# Patient Record
Sex: Female | Born: 1960 | Race: White | Hispanic: No | Smoking: Never smoker
Health system: Southern US, Community
[De-identification: ages and names within clinical notes are randomized; demographics above are authoritative.]

## PROBLEM LIST (undated history)

## (undated) DIAGNOSIS — E559 Vitamin D deficiency, unspecified: Secondary | ICD-10-CM

## (undated) DIAGNOSIS — R0600 Dyspnea, unspecified: Secondary | ICD-10-CM

## (undated) DIAGNOSIS — G4700 Insomnia, unspecified: Secondary | ICD-10-CM

## (undated) DIAGNOSIS — M069 Rheumatoid arthritis, unspecified: Secondary | ICD-10-CM

## (undated) DIAGNOSIS — F32A Depression, unspecified: Secondary | ICD-10-CM

## (undated) DIAGNOSIS — Z780 Asymptomatic menopausal state: Secondary | ICD-10-CM

## (undated) DIAGNOSIS — M5126 Other intervertebral disc displacement, lumbar region: Secondary | ICD-10-CM

## (undated) DIAGNOSIS — M25519 Pain in unspecified shoulder: Secondary | ICD-10-CM

## (undated) DIAGNOSIS — F329 Major depressive disorder, single episode, unspecified: Secondary | ICD-10-CM

## (undated) HISTORY — DX: Insomnia, unspecified: G47.00

## (undated) HISTORY — DX: Vitamin D deficiency, unspecified: E55.9

## (undated) HISTORY — DX: Other intervertebral disc displacement, lumbar region: M51.26

## (undated) HISTORY — DX: Asymptomatic menopausal state: Z78.0

## (undated) HISTORY — DX: Depression, unspecified: F32.A

## (undated) HISTORY — DX: Major depressive disorder, single episode, unspecified: F32.9

## (undated) HISTORY — DX: Rheumatoid arthritis, unspecified: M06.9

## (undated) HISTORY — PX: GANGLION CYST EXCISION: SHX1691

---

## 2004-06-08 ENCOUNTER — Emergency Department: Payer: Self-pay | Admitting: Unknown Physician Specialty

## 2004-06-08 ENCOUNTER — Other Ambulatory Visit: Payer: Self-pay

## 2004-06-11 ENCOUNTER — Ambulatory Visit: Admission: RE | Admit: 2004-06-11 | Discharge: 2004-06-11 | Payer: Self-pay | Admitting: Gynecologic Oncology

## 2004-06-15 ENCOUNTER — Emergency Department: Payer: Self-pay | Admitting: General Practice

## 2005-11-14 ENCOUNTER — Ambulatory Visit: Payer: Self-pay | Admitting: Orthopedic Surgery

## 2006-04-01 ENCOUNTER — Ambulatory Visit: Payer: Self-pay

## 2006-09-22 ENCOUNTER — Ambulatory Visit: Payer: Self-pay

## 2007-03-10 ENCOUNTER — Ambulatory Visit: Payer: Self-pay | Admitting: General Surgery

## 2007-06-28 ENCOUNTER — Ambulatory Visit: Payer: Self-pay

## 2008-07-07 HISTORY — PX: BREAST BIOPSY: SHX20

## 2008-07-18 ENCOUNTER — Ambulatory Visit: Payer: Self-pay

## 2009-07-02 ENCOUNTER — Ambulatory Visit: Payer: Self-pay | Admitting: Rheumatology

## 2009-09-27 ENCOUNTER — Ambulatory Visit: Payer: Self-pay

## 2010-09-19 ENCOUNTER — Ambulatory Visit: Payer: Self-pay

## 2010-12-24 ENCOUNTER — Ambulatory Visit: Payer: Self-pay

## 2010-12-26 ENCOUNTER — Ambulatory Visit: Payer: Self-pay

## 2011-06-18 DIAGNOSIS — M5126 Other intervertebral disc displacement, lumbar region: Secondary | ICD-10-CM | POA: Insufficient documentation

## 2011-07-08 LAB — HM MAMMOGRAPHY: HM MAMMO: NORMAL

## 2012-07-28 ENCOUNTER — Ambulatory Visit: Payer: Self-pay | Admitting: Family Medicine

## 2013-05-30 ENCOUNTER — Ambulatory Visit: Payer: Self-pay | Admitting: Rheumatology

## 2013-12-12 DIAGNOSIS — M5417 Radiculopathy, lumbosacral region: Secondary | ICD-10-CM | POA: Insufficient documentation

## 2014-02-23 DIAGNOSIS — M1380 Other specified arthritis, unspecified site: Secondary | ICD-10-CM | POA: Insufficient documentation

## 2014-02-23 DIAGNOSIS — M1990 Unspecified osteoarthritis, unspecified site: Secondary | ICD-10-CM | POA: Insufficient documentation

## 2014-04-20 LAB — HM PAP SMEAR: HM PAP: NORMAL

## 2014-05-05 LAB — HM COLONOSCOPY: HM COLON: NEGATIVE

## 2014-08-15 ENCOUNTER — Ambulatory Visit: Payer: Self-pay | Admitting: Family Medicine

## 2014-09-21 ENCOUNTER — Encounter
Admit: 2014-09-21 | Disposition: A | Discharge status: Home / Self Care | Payer: Self-pay | Attending: Rheumatology | Admitting: Rheumatology

## 2014-12-18 ENCOUNTER — Encounter: Payer: Self-pay | Admitting: Family Medicine

## 2014-12-18 ENCOUNTER — Ambulatory Visit (INDEPENDENT_AMBULATORY_CARE_PROVIDER_SITE_OTHER): Payer: 59 | Admitting: Family Medicine

## 2014-12-18 ENCOUNTER — Encounter (INDEPENDENT_AMBULATORY_CARE_PROVIDER_SITE_OTHER): Payer: Self-pay

## 2014-12-18 VITALS — BP 134/86 | HR 74 | Temp 98.5°F | Resp 14 | Ht 64.0 in | Wt 122.0 lb

## 2014-12-18 DIAGNOSIS — F321 Major depressive disorder, single episode, moderate: Secondary | ICD-10-CM | POA: Diagnosis not present

## 2014-12-18 DIAGNOSIS — N951 Menopausal and female climacteric states: Secondary | ICD-10-CM | POA: Insufficient documentation

## 2014-12-18 DIAGNOSIS — E559 Vitamin D deficiency, unspecified: Secondary | ICD-10-CM | POA: Insufficient documentation

## 2014-12-18 DIAGNOSIS — Z1239 Encounter for other screening for malignant neoplasm of breast: Secondary | ICD-10-CM

## 2014-12-18 DIAGNOSIS — G4700 Insomnia, unspecified: Secondary | ICD-10-CM | POA: Insufficient documentation

## 2014-12-18 DIAGNOSIS — M1380 Other specified arthritis, unspecified site: Secondary | ICD-10-CM | POA: Diagnosis not present

## 2014-12-18 NOTE — Progress Notes (Signed)
Name: Candice Guzman   MRN: [id number]    DOB: 01-09-1961   Date:12/18/2014       Progress Note  Subjective  Chief Complaint  Chief Complaint  Patient presents with  . Anxiety    worsening due to athritis pain  . Arthritis    worsening-back  . Insomnia    worsening sleep-4hrs  . Depression    never strated brintellix it was to exspensive    HPI  MAJOR DEPRESSION: she has been depressed for years, but getting worse secondary pain and inability to do thinks that she likes. She also has noticed that perimenopausal symptoms with hot flashes makes her not want to be in public, goes straight home from work.  It makes her feel self conscious. She has not been able to tolerate multiple SSRI's and SNRI'S and unable to afford Brintelix.  Refuses evaluation by psychiatrist.   INFLAMMATORY ARTHRITIS: she sees Dr. Gavin Potters, he suggested to add Methotrexate but she is concerned about side effects. She recently had to take a prednisone taper for another flare and now she has right knee effusion, left elbow pain and also right hand is swollen.  She is taking Tramadol but is not controlling symptoms and is affecting her sleep.   INSOMNIA: taking ambien and is able to fall asleep but waking up secondary to pain in the middle of the night, and can't fall back asleep. It is affecting her mood.    Patient Active Problem List   Diagnosis Date Noted  . Insomnia, persistent 12/18/2014  . Depression, major, single episode, moderate 12/18/2014  . Menopausal symptom 12/18/2014  . Vitamin D deficiency 12/18/2014  . Seronegative arthritis 02/23/2014  . Lumbosacral radiculitis 12/12/2013  . Bulge of lumbar disc without myelopathy 06/18/2011    Past Surgical History  Procedure Laterality Date  . Ganglion cyst excision    . Cesarean section      Family History  Problem Relation Age of Onset  . Emphysema Mother   . Cancer Father 60    lung  . Cancer Brother     tongue    History   Social  History  . Marital Status: Divorced    Spouse Name: N/A  . Number of Children: N/A  . Years of Education: N/A   Occupational History  . Not on file.   Social History Main Topics  . Smoking status: Never Smoker   . Smokeless tobacco: Never Used  . Alcohol Use: No  . Drug Use: No  . Sexual Activity: Yes   Other Topics Concern  . Not on file   Social History Narrative     Current outpatient prescriptions:  .  ALPRAZolam (XANAX) 0.5 MG tablet, Take 1 tablet by mouth at bedtime as needed., Disp: , Rfl:  .  cloNIDine (CATAPRES) 0.1 MG tablet, Take 1 tablet by mouth daily., Disp: , Rfl:  .  golimumab (SIMPONI ARIA) 50 MG/4ML SOLN injection, Inject 50 mg into the vein every 30 (thirty) days., Disp: , Rfl:  .  traMADol (ULTRAM) 50 MG tablet, Take 1 tablet by mouth 2 (two) times daily., Disp: , Rfl:  .  zolpidem (AMBIEN CR) 12.5 MG CR tablet, Take 1 tablet by mouth at bedtime., Disp: , Rfl:   Allergies  Allergen Reactions  . Codeine Itching  . Methotrexate Nausea Only     ROS  Constitutional: Negative for fever or weight change.  Respiratory: Negative for cough and shortness of breath.   Cardiovascular: Negative for chest pain  or palpitations.  Gastrointestinal: Negative for abdominal pain, no bowel changes.  Musculoskeletal:joint pains, increase in warmth of left knee, low back pain  Skin: Negative for rash.  Neurological: Negative for dizziness or headache.  No other specific complaints in a complete review of systems (except as listed in HPI above).  Objective  Filed Vitals:   12/18/14 1540  BP: 134/86  Pulse: 74  Temp: 98.5 F (36.9 C)  TempSrc: Oral  Resp: 14  Height: 5\' 4"  (1.626 m)  Weight: 122 lb (55.339 kg)  SpO2: 97%    Body mass index is 20.93 kg/(m^2).  Physical Exam  Constitutional: Patient appears well-developed and well-nourished. No distress.  HENT: Head: Normocephalic and atraumatic. Nose: Nose normal. Mouth/Throat: Oropharynx is clear and  moist. No oropharyngeal exudate.  Eyes: Conjunctivae and EOM are normal. Pupils are equal, round, and reactive to light. No scleral icterus.  Neck: Normal range of motion. Neck supple. No JVD present. No thyromegaly present.  Cardiovascular: Normal rate, regular rhythm and normal heart sounds.  No murmur heard. No BLE edema. Pulmonary/Chest: Effort normal and breath sounds normal. No respiratory distress. Musculoskeletal: effusion of right knee, no redness or increase in warm, pain during palpation of lumbar spine, neg straight leg raise, increase in warmth of left elbow Neurological: he is alert and oriented to person, place, and time. No cranial nerve deficit. Coordination, balance, strength, speech and gait are normal.  Skin: Skin is warm and dry. No rash noted. No erythema.  Psychiatric: Patient has a normal mood and affect. behavior is normal. Judgment and thought content normal.   PHQ2/9: Depression screen PHQ 2/9 12/18/2014  Decreased Interest 1  Down, Depressed, Hopeless 3  PHQ - 2 Score 4  Altered sleeping 3  Tired, decreased energy 3  Change in appetite 2  Feeling bad or failure about yourself  1  Trouble concentrating 1  Moving slowly or fidgety/restless 2  Suicidal thoughts 1  PHQ-9 Score 17  Difficult doing work/chores Extremely dIfficult     Fall Risk: Fall Risk  12/18/2014  Falls in the past year? No     Assessment & Plan  1. Depression, major, single episode, moderate She could not afford Brintelix , she could not tolerate Effexor, Cymbalta, Citalopram, Lexapro, Zoloft. She is very depressed, she feels like her depression is secondary to having pain all the time. She refuses counseling or seeing a psychiatrist  2. Seronegative arthritis She has an effusion on right knee, no redness or increase in warmth, advised to contact Dr. 12/20/2014 to see if she can get a steroid injection.  She is also having some increase in warmth on left elbow. Just finished a steroid  taper that he gave to her. Advised her to re-consider started methotrexate suggested by him on her last visit.   3. Insomnia, persistent Getting worse because of pain. She can fall asleep with Ambien but has been waking up with pain, advised to discuss pain medication change/increase with Dr. Gavin Potters  4. Menopausal symptom Continue Clonidine, still has cycles, no hormonal replacement is not an option, continue clonidine  5. Breast cancer screening  - MM Digital Screening; Future

## 2014-12-18 NOTE — Patient Instructions (Signed)

## 2015-02-12 ENCOUNTER — Other Ambulatory Visit: Payer: Self-pay | Admitting: Family Medicine

## 2015-02-12 NOTE — Telephone Encounter (Signed)
Patient requesting refill. 

## 2015-03-05 ENCOUNTER — Other Ambulatory Visit: Payer: Self-pay | Admitting: Family Medicine

## 2015-03-05 NOTE — Telephone Encounter (Signed)
Patient requesting refill. 

## 2015-03-20 ENCOUNTER — Encounter (INDEPENDENT_AMBULATORY_CARE_PROVIDER_SITE_OTHER): Payer: Self-pay

## 2015-03-20 ENCOUNTER — Encounter: Payer: Self-pay | Admitting: Family Medicine

## 2015-03-20 ENCOUNTER — Ambulatory Visit (INDEPENDENT_AMBULATORY_CARE_PROVIDER_SITE_OTHER): Payer: 59 | Admitting: Family Medicine

## 2015-03-20 VITALS — BP 126/86 | HR 84 | Temp 98.3°F | Resp 16 | Ht 64.0 in | Wt 121.8 lb

## 2015-03-20 DIAGNOSIS — F321 Major depressive disorder, single episode, moderate: Secondary | ICD-10-CM | POA: Diagnosis not present

## 2015-03-20 DIAGNOSIS — L989 Disorder of the skin and subcutaneous tissue, unspecified: Secondary | ICD-10-CM

## 2015-03-20 DIAGNOSIS — G4700 Insomnia, unspecified: Secondary | ICD-10-CM

## 2015-03-20 DIAGNOSIS — M1380 Other specified arthritis, unspecified site: Secondary | ICD-10-CM | POA: Diagnosis not present

## 2015-03-20 DIAGNOSIS — Z23 Encounter for immunization: Secondary | ICD-10-CM | POA: Diagnosis not present

## 2015-03-20 DIAGNOSIS — N951 Menopausal and female climacteric states: Secondary | ICD-10-CM

## 2015-03-20 MED ORDER — VENLAFAXINE HCL ER 37.5 MG PO CP24: 37.5000 mg | ORAL_CAPSULE | Freq: Every day | ORAL | Status: DC

## 2015-03-20 MED ORDER — CLONIDINE HCL 0.1 MG PO TABS: 0.1000 mg | ORAL_TABLET | Freq: Two times a day (BID) | ORAL | Status: DC

## 2015-03-20 MED ORDER — TRAMADOL HCL 50 MG PO TABS: 50.0000 mg | ORAL_TABLET | Freq: Two times a day (BID) | ORAL | Status: DC | PRN

## 2015-03-20 MED ORDER — ZOLPIDEM TARTRATE ER 12.5 MG PO TBCR: 12.5000 mg | EXTENDED_RELEASE_TABLET | Freq: Every day | ORAL | Status: DC

## 2015-03-20 MED ORDER — ALPRAZOLAM 0.5 MG PO TABS: 0.5000 mg | ORAL_TABLET | Freq: Every evening | ORAL | Status: DC | PRN

## 2015-03-20 NOTE — Progress Notes (Signed)
Name: Candice Guzman   MRN: [id number]    DOB: 25-May-1961   Date:03/20/2015       Progress Note  Subjective  Chief Complaint  Chief Complaint  Patient presents with  . Medication Refill    follow-up  . Insomnia    worsening having trouble staying alseep. only sleeping 4-5hrs per night  . Arthritis    HPI  Insomnia: she takes Ambien CR, she falls asleep, but sometimes wakes up between 2-3 and has difficulty falling back asleep, but she takes a half alprazolam it helps her fall back asleep and she wakes up feeling well.   Seronegative Arthritis: she sees Dr. Gavin Potters, she takes immunosuppressant medication, also Tramadol prn . She also has DDD now and is having a flare of her symptoms with increase of pain  Major Depression: she tried Effexor in the past but could not tolerate, but she tried again a few weeks ago and is tolerating it well and feels more calm and able to focus more. Denies crying spells since she re-started medication  Menopause: she is now on Clonidine, Effexor, and still has excessive sweating, she feels like she cannot go anywhere after work because she does not feel clean.   Patient Active Problem List   Diagnosis Date Noted  . Insomnia, persistent 12/18/2014  . Depression, major, single episode, moderate 12/18/2014  . Menopausal symptom 12/18/2014  . Vitamin D deficiency 12/18/2014  . Seronegative arthritis 02/23/2014  . Lumbosacral radiculitis 12/12/2013  . Bulge of lumbar disc without myelopathy 06/18/2011    Past Surgical History  Procedure Laterality Date  . Ganglion cyst excision    . Cesarean section      Family History  Problem Relation Age of Onset  . Emphysema Mother   . Cancer Father 60    lung  . Cancer Brother     tongue    Social History   Social History  . Marital Status: Divorced    Spouse Name: N/A  . Number of Children: N/A  . Years of Education: N/A   Occupational History  . Not on file.   Social History Main  Topics  . Smoking status: Never Smoker   . Smokeless tobacco: Never Used  . Alcohol Use: No  . Drug Use: No  . Sexual Activity: Yes   Other Topics Concern  . Not on file   Social History Narrative     Current outpatient prescriptions:  .  ALPRAZolam (XANAX) 0.5 MG tablet, Take 1 tablet (0.5 mg total) by mouth at bedtime as needed., Disp: 30 tablet, Rfl: 2 .  cloNIDine (CATAPRES) 0.1 MG tablet, Take 1 tablet (0.1 mg total) by mouth 2 (two) times daily., Disp: 180 tablet, Rfl: 1 .  Golimumab (SIMPONI) 50 MG/0.5ML SOAJ, Inject into the skin., Disp: , Rfl:  .  traMADol (ULTRAM) 50 MG tablet, Take 1 tablet (50 mg total) by mouth every 12 (twelve) hours as needed., Disp: 60 tablet, Rfl: 0 .  zolpidem (AMBIEN CR) 12.5 MG CR tablet, Take 1 tablet (12.5 mg total) by mouth at bedtime., Disp: 30 tablet, Rfl: 2 .  venlafaxine XR (EFFEXOR XR) 37.5 MG 24 hr capsule, Take 1 capsule (37.5 mg total) by mouth daily with breakfast., Disp: 30 capsule, Rfl: 0  Allergies  Allergen Reactions  . Codeine Itching  . Methotrexate Nausea Only     ROS  Constitutional: Negative for fever or weight change.  Respiratory: Negative for cough and shortness of breath.   Cardiovascular: Negative for  chest pain or palpitations.  Gastrointestinal: Negative for abdominal pain, no bowel changes.  Musculoskeletal: Negative for gait problem some  joint swelling on hands.  Skin: Negative for rash.  Neurological: Negative for dizziness or headache.  No other specific complaints in a complete review of systems (except as listed in HPI above).  Objective  Filed Vitals:   03/20/15 1516  BP: 126/86  Pulse: 84  Temp: 98.3 F (36.8 C)  TempSrc: Oral  Resp: 16  Height: 5\' 4"  (1.626 m)  Weight: 121 lb 12.8 oz (55.248 kg)  SpO2: 97%    Body mass index is 20.9 kg/(m^2).  Physical Exam  Constitutional: Patient appears well-developed and well-nourished. No distress.  HEENT: head atraumatic, normocephalic, pupils  equal and reactive to light,  neck supple, throat within normal limits Cardiovascular: Normal rate, regular rhythm and normal heart sounds.  No murmur heard. No BLE edema. Pulmonary/Chest: Effort normal and breath sounds normal. No respiratory distress. Abdominal: Soft.  There is no tenderness. Psychiatric: Patient has a normal mood and affect. behavior is normal. Judgment and thought content normal. Muscular Skeletal: some synovitis and deformities on hands Skin: small area that looks like a scab on right forehead, patient states present for the past year   PHQ2/9: Depression screen [facility] 2/9 03/20/2015 03/20/2015 12/18/2014  Decreased Interest - 0 1  Down, Depressed, Hopeless 3 - 3  PHQ - 2 Score 3 0 4  Altered sleeping 3 - 3  Tired, decreased energy 3 - 3  Change in appetite 0 - 2  Feeling bad or failure about yourself  2 - 1  Trouble concentrating 2 - 1  Moving slowly or fidgety/restless 0 - 2  Suicidal thoughts 0 - 1  PHQ-9 Score 13 - 17  Difficult doing work/chores Somewhat difficult - Extremely dIfficult     Fall Risk: Fall Risk  03/20/2015 12/18/2014  Falls in the past year? No No      Functional Status Survey: Is the patient deaf or have difficulty hearing?: No Does the patient have difficulty seeing, even when wearing glasses/contacts?: No Does the patient have difficulty concentrating, remembering, or making decisions?: No Does the patient have difficulty walking or climbing stairs?: No Does the patient have difficulty dressing or bathing?: No Does the patient have difficulty doing errands alone such as visiting a doctor's office or shopping?: No    Assessment & Plan  1. Insomnia, persistent Continue medication  - zolpidem (AMBIEN CR) 12.5 MG CR tablet; Take 1 tablet (12.5 mg total) by mouth at bedtime.  Dispense: 30 tablet; Refill: 2 - ALPRAZolam (XANAX) 0.5 MG tablet; Take 1 tablet (0.5 mg total) by mouth at bedtime as needed.  Dispense: 30 tablet; Refill:  2  2. Needs flu shot  - Flu Vaccine QUAD 36+ mos PF IM (Fluarix & Fluzone Quad PF) - she will get flu shot at work  3. Menopausal symptom Severe, she would like to see dermatologist about it - cloNIDine (CATAPRES) 0.1 MG tablet; Take 1 tablet (0.1 mg total) by mouth 2 (two) times daily.  Dispense: 180 tablet; Refill: 1 - venlafaxine XR (EFFEXOR XR) 37.5 MG 24 hr capsule; Take 1 capsule (37.5 mg total) by mouth daily with breakfast.  Dispense: 30 capsule; Refill: 0  4. Depression, major, single episode, moderate She is doing better on Effexor, she will trying taking 2 pills and if tolerated she will call back for the 75 mg dose - venlafaxine XR (EFFEXOR XR) 37.5 MG 24 hr capsule; Take 1  capsule (37.5 mg total) by mouth daily with breakfast.  Dispense: 30 capsule; Refill: 0  5. Seronegative arthritis  - traMADol (ULTRAM) 50 MG tablet; Take 1 tablet (50 mg total) by mouth every 12 (twelve) hours as needed.  Dispense: 60 tablet; Refill: 0  6. Skin lesion of face  - Ambulatory referral to Dermatology

## 2015-03-27 ENCOUNTER — Other Ambulatory Visit: Payer: Self-pay | Admitting: Family Medicine

## 2015-03-27 NOTE — Telephone Encounter (Signed)
Patient requesting refill. 

## 2015-03-30 ENCOUNTER — Other Ambulatory Visit: Payer: Self-pay

## 2015-03-30 ENCOUNTER — Encounter: Payer: Self-pay | Admitting: Family Medicine

## 2015-03-30 MED ORDER — VENLAFAXINE HCL ER 37.5 MG PO CP24: 75.0000 mg | ORAL_CAPSULE | Freq: Every day | ORAL | Status: DC

## 2015-03-30 NOTE — Telephone Encounter (Signed)
Please send a 30 day supply to [facility].

## 2015-04-19 ENCOUNTER — Other Ambulatory Visit: Payer: Self-pay | Admitting: Family Medicine

## 2015-04-19 MED ORDER — VENLAFAXINE HCL ER 37.5 MG PO CP24: 75.0000 mg | ORAL_CAPSULE | Freq: Every day | ORAL | Status: DC

## 2015-04-19 NOTE — Telephone Encounter (Signed)
Pt is requesting a refill on Effexor 37.5mg  to be sent to [facility] on [address].  Please call patient and document once complete.

## 2015-06-11 ENCOUNTER — Other Ambulatory Visit: Payer: Self-pay | Admitting: Family Medicine

## 2015-06-11 NOTE — Telephone Encounter (Signed)
Patient requesting refill. 

## 2015-06-19 ENCOUNTER — Encounter: Payer: Self-pay | Admitting: Family Medicine

## 2015-06-19 ENCOUNTER — Ambulatory Visit (INDEPENDENT_AMBULATORY_CARE_PROVIDER_SITE_OTHER): Payer: 59 | Admitting: Family Medicine

## 2015-06-19 VITALS — BP 136/84 | HR 84 | Temp 98.0°F | Resp 18 | Ht 64.0 in | Wt 124.9 lb

## 2015-06-19 DIAGNOSIS — M1380 Other specified arthritis, unspecified site: Secondary | ICD-10-CM

## 2015-06-19 DIAGNOSIS — G4700 Insomnia, unspecified: Secondary | ICD-10-CM | POA: Diagnosis not present

## 2015-06-19 DIAGNOSIS — F321 Major depressive disorder, single episode, moderate: Secondary | ICD-10-CM | POA: Diagnosis not present

## 2015-06-19 DIAGNOSIS — N951 Menopausal and female climacteric states: Secondary | ICD-10-CM

## 2015-06-19 MED ORDER — VENLAFAXINE HCL ER 75 MG PO CP24: 75.0000 mg | ORAL_CAPSULE | Freq: Every day | ORAL | Status: DC

## 2015-06-19 MED ORDER — HYDROCODONE-ACETAMINOPHEN 10-325 MG PO TABS: 1.0000 | ORAL_TABLET | Freq: Four times a day (QID) | ORAL | Status: DC | PRN

## 2015-06-19 MED ORDER — ALPRAZOLAM 0.5 MG PO TABS: 0.5000 mg | ORAL_TABLET | Freq: Every evening | ORAL | Status: DC | PRN

## 2015-06-19 MED ORDER — TRAMADOL HCL 50 MG PO TABS: 50.0000 mg | ORAL_TABLET | Freq: Two times a day (BID) | ORAL | Status: DC | PRN

## 2015-06-19 MED ORDER — ZOLPIDEM TARTRATE ER 12.5 MG PO TBCR: 12.5000 mg | EXTENDED_RELEASE_TABLET | Freq: Every day | ORAL | Status: DC

## 2015-06-19 NOTE — Progress Notes (Signed)
Name: Candice Guzman   MRN: [id number]    DOB: [birth year]   Date:06/19/2015       Progress Note  Subjective  Chief Complaint  Chief Complaint  Patient presents with  . Medication Refill    3 month F/U  . Insomnia    Worse due to arthritis pain- total hours of sleep 4-5 hours nightly, but due to pain was unable to sleep any this weekend.  Marland Kitchen Hot Flashes    Medication helping some  . Rheumatoid Arthritis    Goes to Dr. Lavenia Atlas and will see him in January but arthritis has flaired up and wanted to see if you could prescribed something in the mean time.     HPI  Insomnia: she takes Ambien CR, she falls asleep, but sometimes wakes up between 2-3 and has difficulty falling back asleep, but she takes a half alprazolam it helps her fall back asleep and she wakes up feeling well. She is currently having increase in pain from inflammatory arthritis and is affecting her sleep even more, currently sleeping less than 5 hours per night and is feeling very tired today. She missed work yesterday because of pain and inability to sleep   Seronegative Arthritis: she sees Dr. Gavin Potters, she takes immunosuppressant medication, also Tramadol prn . She also has DDD . She is having a flare at this time. Missed work yesterday.  Advised her to call Dr. Gavin Potters and see if he will fill prednisone her follow up with him is in January.   Major Depression: she was given Effexor 37.5 mg in September, she was feeling better.  Denies crying spells since she re-started medication, but recently with the flare of inflammatory arthritis she has noticed anhedonia and fatigue again. She just increased dose yesterday to 75 mg and only side effects was sedation.   Menopause: she is now on Clonidine, Effexor, and still has excessive sweating, but has improved with medication, she feels like she cannot go anywhere after work because she does not feel clean.  Patient Active Problem List   Diagnosis Date Noted  .  Insomnia, persistent 12/18/2014  . Depression, major, single episode, moderate (HCC) 12/18/2014  . Menopausal symptom 12/18/2014  . Vitamin D deficiency 12/18/2014  . Seronegative arthritis 02/23/2014  . Lumbosacral radiculitis 12/12/2013  . Bulge of lumbar disc without myelopathy 06/18/2011    Past Surgical History  Procedure Laterality Date  . Ganglion cyst excision    . Cesarean section      Family History  Problem Relation Age of Onset  . Emphysema Mother   . Cancer Father 60    lung  . Cancer Brother     tongue    Social History   Social History  . Marital Status: Divorced    Spouse Name: N/A  . Number of Children: N/A  . Years of Education: N/A   Occupational History  . Not on file.   Social History Main Topics  . Smoking status: Never Smoker   . Smokeless tobacco: Never Used  . Alcohol Use: No  . Drug Use: No  . Sexual Activity: Yes   Other Topics Concern  . Not on file   Social History Narrative     Current outpatient prescriptions:  .  ALPRAZolam (XANAX) 0.5 MG tablet, Take 1 tablet (0.5 mg total) by mouth at bedtime as needed., Disp: 30 tablet, Rfl: 2 .  cloNIDine (CATAPRES) 0.1 MG tablet, Take 1 tablet (0.1 mg total) by mouth 2 (two) times  daily., Disp: 180 tablet, Rfl: 1 .  Golimumab (SIMPONI) 50 MG/0.5ML SOAJ, Inject into the skin., Disp: , Rfl:  .  traMADol (ULTRAM) 50 MG tablet, Take 1 tablet (50 mg total) by mouth every 12 (twelve) hours as needed., Disp: 60 tablet, Rfl: 0 .  venlafaxine XR (EFFEXOR-XR) 37.5 MG 24 hr capsule, Take 2 capsules (75 mg total) by mouth daily with breakfast. Increase to two pills of 75 mg as tolerated, Disp: 60 capsule, Rfl: 2 .  zolpidem (AMBIEN CR) 12.5 MG CR tablet, TAKE ONE TABLET BY MOUTH NIGHTLY AT BEDTIME, Disp: 30 tablet, Rfl: 0  Allergies  Allergen Reactions  . Codeine Itching  . Methotrexate Nausea Only     ROS  Constitutional: Negative for fever or weight change.  Respiratory: Negative for cough  and shortness of breath.   Cardiovascular: Negative for chest pain or palpitations.  Gastrointestinal: Negative for abdominal pain, no bowel changes.  Musculoskeletal: Negative for gait problem , positive for joint swelling, left knee and both hands.  Skin: Negative for rash.  Neurological: Negative for dizziness or headache.  No other specific complaints in a complete review of systems (except as listed in HPI above). Objective  Filed Vitals:   06/19/15 1539  BP: 136/84  Pulse: 84  Temp: 98 F (36.7 C)  TempSrc: Oral  Resp: 18  Height: 5\' 4"  (1.626 m)  Weight: 124 lb 14.4 oz (56.654 kg)  SpO2: 97%    Body mass index is 21.43 kg/(m^2).  Physical Exam  Constitutional: Patient appears well-developed and well-nourished.  No distress.  HEENT: head atraumatic, normocephalic, pupils equal and reactive to light, neck supple, throat within normal limits Cardiovascular: Normal rate, regular rhythm and normal heart sounds.  No murmur heard. No BLE edema. Pulmonary/Chest: Effort normal and breath sounds normal. No respiratory distress. Abdominal: Soft.  There is no tenderness. Psychiatric: Patient has a normal mood and affect. behavior is normal. Judgment and thought content normal. Muscular Skeletal: some deformity of DIP joints, enlarged. No synovitis today . Mild effusion left knee  PHQ2/9: Depression screen [facility] 2/9 06/19/2015 03/20/2015 03/20/2015 12/18/2014  Decreased Interest 0 - 0 1  Down, Depressed, Hopeless 0 3 - 3  PHQ - 2 Score 0 3 0 4  Altered sleeping - 3 - 3  Tired, decreased energy - 3 - 3  Change in appetite - 0 - 2  Feeling bad or failure about yourself  - 2 - 1  Trouble concentrating - 2 - 1  Moving slowly or fidgety/restless - 0 - 2  Suicidal thoughts - 0 - 1  PHQ-9 Score - 13 - 17  Difficult doing work/chores - Somewhat difficult - Extremely dIfficult    Fall Risk: Fall Risk  06/19/2015 03/20/2015 12/18/2014  Falls in the past year? Yes No No  Number falls in  past yr: 1 - -  Injury with Fall? Yes - -     Functional Status Survey: Is the patient deaf or have difficulty hearing?: No Does the patient have difficulty seeing, even when wearing glasses/contacts?: Yes (reading glasses) Does the patient have difficulty concentrating, remembering, or making decisions?: No Does the patient have difficulty walking or climbing stairs?: No Does the patient have difficulty dressing or bathing?: No Does the patient have difficulty doing errands alone such as visiting a doctor's office or shopping?: No    Assessment & Plan  1. Insomnia, persistent  We will add pain medication since the pain is keeping her awake at night - ALPRAZolam (  XANAX) 0.5 MG tablet; Take 1 tablet (0.5 mg total) by mouth at bedtime as needed.  Dispense: 30 tablet; Refill: 2 - zolpidem (AMBIEN CR) 12.5 MG CR tablet; Take 1 tablet (12.5 mg total) by mouth at bedtime.  Dispense: 30 tablet; Refill: 1  2. Seronegative arthritis  I will add Norco to take prn for severe pain, advised to call Dr. Gavin Potters - HYDROcodone-acetaminophen [facility]) 10-325 MG tablet; Take 1 tablet by mouth every 6 (six) hours as needed.  Dispense: 20 tablet; Refill: 0 - traMADol (ULTRAM) 50 MG tablet; Take 1 tablet (50 mg total) by mouth every 12 (twelve) hours as needed.  Dispense: 60 tablet; Refill: 0  3. Depression, major, single episode, moderate (HCC)  improving - venlafaxine XR (EFFEXOR-XR) 75 MG 24 hr capsule; Take 1 capsule (75 mg total) by mouth daily with breakfast.  Dispense: 30 capsule; Refill: 2  4. Menopausal symptom  Improving, continue medications, increased dose yesterday to 75 mg daily  - venlafaxine XR (EFFEXOR-XR) 75 MG 24 hr capsule; Take 1 capsule (75 mg total) by mouth daily with breakfast.  Dispense: 30 capsule; Refill: 2

## 2015-07-13 ENCOUNTER — Encounter: Payer: Self-pay | Admitting: *Deleted

## 2015-07-13 ENCOUNTER — Emergency Department: Payer: 59

## 2015-07-13 ENCOUNTER — Emergency Department
Admission: EM | Admit: 2015-07-13 | Discharge: 2015-07-13 | Disposition: A | Discharge status: Home / Self Care | Payer: 59 | Attending: Emergency Medicine | Admitting: Emergency Medicine

## 2015-07-13 DIAGNOSIS — Z79899 Other long term (current) drug therapy: Secondary | ICD-10-CM | POA: Diagnosis not present

## 2015-07-13 DIAGNOSIS — S6391XA Sprain of unspecified part of right wrist and hand, initial encounter: Secondary | ICD-10-CM

## 2015-07-13 DIAGNOSIS — S29012A Strain of muscle and tendon of back wall of thorax, initial encounter: Secondary | ICD-10-CM | POA: Diagnosis not present

## 2015-07-13 DIAGNOSIS — Y92410 Unspecified street and highway as the place of occurrence of the external cause: Secondary | ICD-10-CM | POA: Insufficient documentation

## 2015-07-13 DIAGNOSIS — S29019A Strain of muscle and tendon of unspecified wall of thorax, initial encounter: Secondary | ICD-10-CM

## 2015-07-13 DIAGNOSIS — Y998 Other external cause status: Secondary | ICD-10-CM | POA: Insufficient documentation

## 2015-07-13 DIAGNOSIS — S20219A Contusion of unspecified front wall of thorax, initial encounter: Secondary | ICD-10-CM | POA: Diagnosis not present

## 2015-07-13 DIAGNOSIS — Y9389 Activity, other specified: Secondary | ICD-10-CM | POA: Diagnosis not present

## 2015-07-13 DIAGNOSIS — R0602 Shortness of breath: Secondary | ICD-10-CM | POA: Diagnosis not present

## 2015-07-13 DIAGNOSIS — S6991XA Unspecified injury of right wrist, hand and finger(s), initial encounter: Secondary | ICD-10-CM | POA: Diagnosis present

## 2015-07-13 MED ORDER — TIZANIDINE HCL 4 MG PO TABS: 4.0000 mg | ORAL_TABLET | Freq: Three times a day (TID) | ORAL | Status: DC

## 2015-07-13 MED ORDER — MELOXICAM 15 MG PO TABS
15.0000 mg | ORAL_TABLET | Freq: Every day | ORAL | Status: DC
Start: 2015-07-13 — End: 2015-09-17

## 2015-07-13 MED ORDER — DIAZEPAM 2 MG PO TABS
2.0000 mg | ORAL_TABLET | Freq: Once | ORAL | Status: AC
  Administered 2015-07-13: 2 mg via ORAL

## 2015-07-13 MED ORDER — DIAZEPAM 2 MG PO TABS
ORAL_TABLET | ORAL | Status: AC
  Filled 2015-07-13: qty 1

## 2015-07-13 MED ORDER — OXYCODONE-ACETAMINOPHEN 5-325 MG PO TABS: 1.0000 | ORAL_TABLET | Freq: Four times a day (QID) | ORAL | Status: DC | PRN
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
## 2015-07-13 NOTE — ED Notes (Signed)
Reviewed d/c instructions, pain management techniques, prescriptions, and use of ice/elevation with patient. Pt verbalized understanding.
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
## 2015-07-27 ENCOUNTER — Other Ambulatory Visit: Payer: Self-pay | Admitting: Family Medicine

## 2015-07-27 DIAGNOSIS — G4700 Insomnia, unspecified: Secondary | ICD-10-CM

## 2015-07-27 MED ORDER — ZOLPIDEM TARTRATE ER 12.5 MG PO TBCR
12.5000 mg | EXTENDED_RELEASE_TABLET | Freq: Every day | ORAL | Status: DC
Start: 2015-07-27 — End: 2015-09-17

## 2015-07-27 MED ORDER — ZOLPIDEM TARTRATE ER 12.5 MG PO TBCR: 12.5000 mg | EXTENDED_RELEASE_TABLET | Freq: Every day | ORAL | Status: DC

## 2015-07-27 NOTE — Telephone Encounter (Signed)
Pt needs refill on Ambien

## 2015-07-28 ENCOUNTER — Other Ambulatory Visit: Payer: Self-pay | Admitting: Family Medicine

## 2015-09-10 ENCOUNTER — Other Ambulatory Visit: Payer: Self-pay | Admitting: Family Medicine

## 2015-09-17 ENCOUNTER — Encounter: Payer: Self-pay | Admitting: Family Medicine

## 2015-09-17 ENCOUNTER — Ambulatory Visit (INDEPENDENT_AMBULATORY_CARE_PROVIDER_SITE_OTHER): Payer: 59 | Admitting: Family Medicine

## 2015-09-17 VITALS — BP 126/72 | HR 84 | Temp 97.9°F | Resp 18 | Ht 64.0 in | Wt 121.0 lb

## 2015-09-17 DIAGNOSIS — M5126 Other intervertebral disc displacement, lumbar region: Secondary | ICD-10-CM | POA: Diagnosis not present

## 2015-09-17 DIAGNOSIS — F321 Major depressive disorder, single episode, moderate: Secondary | ICD-10-CM

## 2015-09-17 DIAGNOSIS — M51369 Other intervertebral disc degeneration, lumbar region without mention of lumbar back pain or lower extremity pain: Secondary | ICD-10-CM

## 2015-09-17 DIAGNOSIS — G4700 Insomnia, unspecified: Secondary | ICD-10-CM

## 2015-09-17 DIAGNOSIS — M1380 Other specified arthritis, unspecified site: Secondary | ICD-10-CM

## 2015-09-17 DIAGNOSIS — N951 Menopausal and female climacteric states: Secondary | ICD-10-CM

## 2015-09-17 DIAGNOSIS — M5136 Other intervertebral disc degeneration, lumbar region: Secondary | ICD-10-CM

## 2015-09-17 DIAGNOSIS — Z1239 Encounter for other screening for malignant neoplasm of breast: Secondary | ICD-10-CM

## 2015-09-17 MED ORDER — TIZANIDINE HCL 4 MG PO TABS: 4.0000 mg | ORAL_TABLET | Freq: Three times a day (TID) | ORAL | Status: DC

## 2015-09-17 MED ORDER — VENLAFAXINE HCL ER 75 MG PO CP24: 75.0000 mg | ORAL_CAPSULE | Freq: Every day | ORAL | Status: DC

## 2015-09-17 MED ORDER — ZOLPIDEM TARTRATE ER 12.5 MG PO TBCR
12.5000 mg | EXTENDED_RELEASE_TABLET | Freq: Every day | ORAL | Status: DC
Start: 2015-09-17 — End: 2015-12-20

## 2015-09-17 MED ORDER — TRAMADOL HCL 50 MG PO TABS: 50.0000 mg | ORAL_TABLET | Freq: Two times a day (BID) | ORAL | Status: DC | PRN

## 2015-09-17 MED ORDER — OXYCODONE-ACETAMINOPHEN 5-325 MG PO TABS: 1.0000 | ORAL_TABLET | Freq: Four times a day (QID) | ORAL | Status: DC | PRN

## 2015-09-17 MED ORDER — ALPRAZOLAM 0.5 MG PO TABS: 0.5000 mg | ORAL_TABLET | Freq: Every evening | ORAL | Status: DC | PRN
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
## 2015-11-27 ENCOUNTER — Other Ambulatory Visit: Payer: Self-pay | Admitting: Family Medicine

## 2015-11-27 NOTE — Telephone Encounter (Signed)
Patient requesting refill. 

## 2015-12-19 ENCOUNTER — Ambulatory Visit: Payer: 59 | Admitting: Family Medicine

## 2015-12-20 ENCOUNTER — Encounter: Payer: Self-pay | Admitting: Family Medicine

## 2015-12-20 ENCOUNTER — Ambulatory Visit (INDEPENDENT_AMBULATORY_CARE_PROVIDER_SITE_OTHER): Payer: 59 | Admitting: Family Medicine

## 2015-12-20 VITALS — BP 112/62 | HR 81 | Temp 98.1°F | Resp 16 | Ht 64.0 in | Wt 122.4 lb

## 2015-12-20 DIAGNOSIS — L709 Acne, unspecified: Secondary | ICD-10-CM | POA: Diagnosis not present

## 2015-12-20 DIAGNOSIS — M5126 Other intervertebral disc displacement, lumbar region: Secondary | ICD-10-CM | POA: Diagnosis not present

## 2015-12-20 DIAGNOSIS — G4700 Insomnia, unspecified: Secondary | ICD-10-CM

## 2015-12-20 DIAGNOSIS — M1380 Other specified arthritis, unspecified site: Secondary | ICD-10-CM | POA: Diagnosis not present

## 2015-12-20 DIAGNOSIS — N951 Menopausal and female climacteric states: Secondary | ICD-10-CM

## 2015-12-20 DIAGNOSIS — F321 Major depressive disorder, single episode, moderate: Secondary | ICD-10-CM | POA: Diagnosis not present

## 2015-12-20 DIAGNOSIS — R196 Halitosis: Secondary | ICD-10-CM

## 2015-12-20 DIAGNOSIS — M5136 Other intervertebral disc degeneration, lumbar region: Secondary | ICD-10-CM

## 2015-12-20 MED ORDER — TRAMADOL HCL 50 MG PO TABS: 50.0000 mg | ORAL_TABLET | Freq: Two times a day (BID) | ORAL | Status: DC | PRN

## 2015-12-20 MED ORDER — NYSTATIN 100000 UNIT/ML MT SUSP: 5.0000 mL | Freq: Four times a day (QID) | OROMUCOSAL | Status: DC

## 2015-12-20 MED ORDER — VENLAFAXINE HCL ER 150 MG PO CP24: 150.0000 mg | ORAL_CAPSULE | Freq: Every day | ORAL | Status: DC

## 2015-12-20 MED ORDER — TIZANIDINE HCL 4 MG PO TABS: 4.0000 mg | ORAL_TABLET | Freq: Four times a day (QID) | ORAL | Status: DC | PRN

## 2015-12-20 MED ORDER — CLONIDINE HCL 0.1 MG PO TABS: ORAL_TABLET | ORAL | Status: DC

## 2015-12-20 MED ORDER — ALPRAZOLAM 0.5 MG PO TABS: 0.5000 mg | ORAL_TABLET | Freq: Every evening | ORAL | Status: DC | PRN

## 2015-12-20 MED ORDER — ZOLPIDEM TARTRATE ER 12.5 MG PO TBCR: 12.5000 mg | EXTENDED_RELEASE_TABLET | Freq: Every day | ORAL | Status: DC
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
## 2015-12-25 ENCOUNTER — Telehealth: Payer: Self-pay | Admitting: Family Medicine

## 2015-12-25 MED ORDER — FLUCONAZOLE 150 MG PO TABS: 150.0000 mg | ORAL_TABLET | ORAL | Status: DC

## 2015-12-25 NOTE — Telephone Encounter (Signed)
Pt states she received all her meds in the mail yesterday from her mail order pharmacy. Pt states she was supposed to get one for the bacteria in her mouth from yeast and she  did not receive it. Pt is asking for this one to be sent to [facility] in [place]

## 2015-12-25 NOTE — Telephone Encounter (Signed)
done

## 2016-01-09 ENCOUNTER — Ambulatory Visit: Payer: No Typology Code available for payment source

## 2016-01-21 ENCOUNTER — Ambulatory Visit: Payer: No Typology Code available for payment source

## 2016-01-23 ENCOUNTER — Ambulatory Visit
Admission: RE | Admit: 2016-01-23 | Discharge: 2016-01-23 | Disposition: A | Discharge status: Home / Self Care | Payer: 59 | Source: Ambulatory Visit | Attending: Family Medicine | Admitting: Family Medicine

## 2016-01-23 ENCOUNTER — Other Ambulatory Visit: Payer: Self-pay | Admitting: Family Medicine

## 2016-01-23 DIAGNOSIS — Z1239 Encounter for other screening for malignant neoplasm of breast: Secondary | ICD-10-CM

## 2016-01-23 DIAGNOSIS — Z1231 Encounter for screening mammogram for malignant neoplasm of breast: Secondary | ICD-10-CM | POA: Diagnosis not present

## 2016-01-24 ENCOUNTER — Other Ambulatory Visit: Payer: Self-pay | Admitting: Family Medicine

## 2016-01-24 DIAGNOSIS — R928 Other abnormal and inconclusive findings on diagnostic imaging of breast: Secondary | ICD-10-CM

## 2016-02-08 ENCOUNTER — Ambulatory Visit
Admission: RE | Admit: 2016-02-08 | Discharge: 2016-02-08 | Disposition: A | Discharge status: Home / Self Care | Payer: 59 | Source: Ambulatory Visit | Attending: Family Medicine | Admitting: Family Medicine

## 2016-02-08 DIAGNOSIS — R928 Other abnormal and inconclusive findings on diagnostic imaging of breast: Secondary | ICD-10-CM

## 2016-02-11 LAB — LIPID PANEL
Cholesterol: 197 mg/dL (ref 0–200)
HDL: 78 mg/dL — AB (ref 35–70)
LDL Cholesterol: 108 mg/dL
Triglycerides: 57 mg/dL (ref 40–160)

## 2016-02-11 LAB — HEMOGLOBIN A1C: Hemoglobin A1C: 5.1

## 2016-02-29 ENCOUNTER — Other Ambulatory Visit: Payer: Self-pay | Admitting: Family Medicine

## 2016-03-03 ENCOUNTER — Encounter: Payer: Self-pay | Admitting: Family Medicine

## 2016-03-03 ENCOUNTER — Ambulatory Visit (INDEPENDENT_AMBULATORY_CARE_PROVIDER_SITE_OTHER): Payer: 59 | Admitting: Family Medicine

## 2016-03-03 ENCOUNTER — Other Ambulatory Visit: Payer: Self-pay

## 2016-03-03 VITALS — BP 118/78 | HR 94 | Temp 98.2°F | Resp 18 | Ht 62.75 in | Wt 120.4 lb

## 2016-03-03 DIAGNOSIS — N951 Menopausal and female climacteric states: Secondary | ICD-10-CM

## 2016-03-03 DIAGNOSIS — M5136 Other intervertebral disc degeneration, lumbar region: Secondary | ICD-10-CM

## 2016-03-03 DIAGNOSIS — F321 Major depressive disorder, single episode, moderate: Secondary | ICD-10-CM | POA: Diagnosis not present

## 2016-03-03 DIAGNOSIS — M5126 Other intervertebral disc displacement, lumbar region: Secondary | ICD-10-CM

## 2016-03-03 DIAGNOSIS — G4700 Insomnia, unspecified: Secondary | ICD-10-CM

## 2016-03-03 DIAGNOSIS — M51369 Other intervertebral disc degeneration, lumbar region without mention of lumbar back pain or lower extremity pain: Secondary | ICD-10-CM

## 2016-03-03 DIAGNOSIS — M1380 Other specified arthritis, unspecified site: Secondary | ICD-10-CM

## 2016-03-03 MED ORDER — TRAMADOL HCL 50 MG PO TABS: 50.0000 mg | ORAL_TABLET | Freq: Two times a day (BID) | ORAL | 0 refills | Status: DC | PRN

## 2016-03-03 MED ORDER — ALPRAZOLAM 0.5 MG PO TABS: 0.5000 mg | ORAL_TABLET | Freq: Every evening | ORAL | 0 refills | Status: DC | PRN

## 2016-03-03 MED ORDER — DULOXETINE HCL 30 MG PO CPEP: 30.0000 mg | ORAL_CAPSULE | Freq: Every day | ORAL | 0 refills | Status: DC

## 2016-03-03 MED ORDER — ZOLPIDEM TARTRATE ER 6.25 MG PO TBCR: 6.2500 mg | EXTENDED_RELEASE_TABLET | Freq: Every day | ORAL | 0 refills | Status: DC

## 2016-03-03 NOTE — Telephone Encounter (Signed)
Patient requesting refill of Alprazolam, Zolpidem, and Tramadol be sent into [facility].
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
## 2016-03-04 ENCOUNTER — Encounter: Payer: Self-pay | Admitting: Family Medicine

## 2016-03-25 ENCOUNTER — Other Ambulatory Visit: Payer: Self-pay

## 2016-03-25 DIAGNOSIS — M5126 Other intervertebral disc displacement, lumbar region: Secondary | ICD-10-CM

## 2016-03-25 DIAGNOSIS — M5136 Other intervertebral disc degeneration, lumbar region: Secondary | ICD-10-CM

## 2016-03-25 MED ORDER — TIZANIDINE HCL 4 MG PO TABS: 4.0000 mg | ORAL_TABLET | Freq: Four times a day (QID) | ORAL | 1 refills | Status: DC | PRN

## 2016-06-04 ENCOUNTER — Ambulatory Visit (INDEPENDENT_AMBULATORY_CARE_PROVIDER_SITE_OTHER): Payer: 59 | Admitting: Family Medicine

## 2016-06-04 ENCOUNTER — Encounter: Payer: Self-pay | Admitting: Family Medicine

## 2016-06-04 VITALS — BP 120/78 | HR 104 | Temp 99.0°F | Resp 16 | Ht 63.0 in | Wt 126.1 lb

## 2016-06-04 DIAGNOSIS — N951 Menopausal and female climacteric states: Secondary | ICD-10-CM | POA: Diagnosis not present

## 2016-06-04 DIAGNOSIS — G4700 Insomnia, unspecified: Secondary | ICD-10-CM

## 2016-06-04 DIAGNOSIS — G8929 Other chronic pain: Secondary | ICD-10-CM | POA: Diagnosis not present

## 2016-06-04 DIAGNOSIS — F321 Major depressive disorder, single episode, moderate: Secondary | ICD-10-CM | POA: Diagnosis not present

## 2016-06-04 DIAGNOSIS — M545 Low back pain, unspecified: Secondary | ICD-10-CM

## 2016-06-04 DIAGNOSIS — M1380 Other specified arthritis, unspecified site: Secondary | ICD-10-CM | POA: Diagnosis not present

## 2016-06-04 MED ORDER — DULOXETINE HCL 60 MG PO CPEP: 60.0000 mg | ORAL_CAPSULE | Freq: Every day | ORAL | 1 refills | Status: DC

## 2016-06-04 MED ORDER — ZOLPIDEM TARTRATE ER 12.5 MG PO TBCR: 12.5000 mg | EXTENDED_RELEASE_TABLET | Freq: Every day | ORAL | 0 refills | Status: DC

## 2016-06-04 MED ORDER — CLONIDINE HCL 0.1 MG PO TABS: ORAL_TABLET | ORAL | 1 refills | Status: DC
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
## 2016-06-06 HISTORY — PX: METACARPOPHALANGEAL JOINT ARTHROPLASTY: SUR72

## 2016-06-20 ENCOUNTER — Ambulatory Visit: Payer: 59 | Admitting: Family Medicine

## 2016-07-25 ENCOUNTER — Other Ambulatory Visit: Payer: Self-pay

## 2016-07-25 DIAGNOSIS — G4700 Insomnia, unspecified: Secondary | ICD-10-CM

## 2016-07-25 MED ORDER — ZOLPIDEM TARTRATE ER 12.5 MG PO TBCR: 12.5000 mg | EXTENDED_RELEASE_TABLET | Freq: Every day | ORAL | 0 refills | Status: DC

## 2016-07-25 NOTE — Telephone Encounter (Signed)
Patient requesting refill of Ambien to [facility]. 

## 2016-08-07 ENCOUNTER — Other Ambulatory Visit: Payer: Self-pay | Admitting: Family Medicine

## 2016-08-07 DIAGNOSIS — G8929 Other chronic pain: Secondary | ICD-10-CM

## 2016-08-07 DIAGNOSIS — N951 Menopausal and female climacteric states: Secondary | ICD-10-CM

## 2016-08-07 DIAGNOSIS — M545 Low back pain, unspecified: Secondary | ICD-10-CM

## 2016-08-07 DIAGNOSIS — F321 Major depressive disorder, single episode, moderate: Secondary | ICD-10-CM

## 2016-09-03 ENCOUNTER — Other Ambulatory Visit: Payer: Self-pay

## 2016-09-03 ENCOUNTER — Encounter: Payer: Self-pay | Admitting: Family Medicine

## 2016-09-03 ENCOUNTER — Ambulatory Visit (INDEPENDENT_AMBULATORY_CARE_PROVIDER_SITE_OTHER): Payer: 59 | Admitting: Family Medicine

## 2016-09-03 VITALS — BP 130/86 | HR 78 | Temp 98.0°F | Resp 16 | Ht 63.0 in | Wt 128.4 lb

## 2016-09-03 DIAGNOSIS — M1380 Other specified arthritis, unspecified site: Secondary | ICD-10-CM | POA: Diagnosis not present

## 2016-09-03 DIAGNOSIS — G8929 Other chronic pain: Secondary | ICD-10-CM

## 2016-09-03 DIAGNOSIS — M5126 Other intervertebral disc displacement, lumbar region: Secondary | ICD-10-CM | POA: Diagnosis not present

## 2016-09-03 DIAGNOSIS — R439 Unspecified disturbances of smell and taste: Secondary | ICD-10-CM

## 2016-09-03 DIAGNOSIS — M5136 Other intervertebral disc degeneration, lumbar region: Secondary | ICD-10-CM

## 2016-09-03 DIAGNOSIS — Z9889 Other specified postprocedural states: Secondary | ICD-10-CM

## 2016-09-03 DIAGNOSIS — N951 Menopausal and female climacteric states: Secondary | ICD-10-CM

## 2016-09-03 DIAGNOSIS — M545 Low back pain, unspecified: Secondary | ICD-10-CM

## 2016-09-03 DIAGNOSIS — G4700 Insomnia, unspecified: Secondary | ICD-10-CM | POA: Diagnosis not present

## 2016-09-03 DIAGNOSIS — F321 Major depressive disorder, single episode, moderate: Secondary | ICD-10-CM

## 2016-09-03 MED ORDER — TRAMADOL HCL 50 MG PO TABS: 50.0000 mg | ORAL_TABLET | Freq: Two times a day (BID) | ORAL | 0 refills | Status: DC | PRN

## 2016-09-03 MED ORDER — SUVOREXANT 15 MG PO TABS: 1.0000 | ORAL_TABLET | Freq: Every evening | ORAL | 0 refills | Status: DC | PRN

## 2016-09-03 MED ORDER — TIZANIDINE HCL 4 MG PO TABS: 4.0000 mg | ORAL_TABLET | Freq: Four times a day (QID) | ORAL | 1 refills | Status: DC | PRN

## 2016-09-03 MED ORDER — DULOXETINE HCL 60 MG PO CPEP: 120.0000 mg | ORAL_CAPSULE | Freq: Every day | ORAL | 1 refills | Status: DC

## 2016-09-03 MED ORDER — SUVOREXANT 10 MG PO TABS
1.0000 | ORAL_TABLET | Freq: Every evening | ORAL | 0 refills | Status: DC | PRN
Start: 2016-09-03 — End: 2016-11-18

## 2016-09-03 MED ORDER — CLONIDINE HCL 0.1 MG PO TABS: ORAL_TABLET | ORAL | 1 refills | Status: DC

## 2016-09-03 MED ORDER — SUVOREXANT 20 MG PO TABS: 1.0000 | ORAL_TABLET | Freq: Every evening | ORAL | 0 refills | Status: DC | PRN
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
## 2016-09-03 NOTE — Telephone Encounter (Signed)
Patient requesting refill of Tramadol to [facility].  

## 2016-09-08 ENCOUNTER — Telehealth: Payer: Self-pay

## 2016-09-08 NOTE — Telephone Encounter (Signed)
I got a fax from [facility] stating that this patient needs to contact their office prior to an appt being made. I called and left a message stating that in order to complete the referral she must give their office a call and their number was given [phone]).

## 2016-09-09 NOTE — Telephone Encounter (Signed)
I thought it was sent electronic the day of her visit. Please verify

## 2016-11-18 ENCOUNTER — Ambulatory Visit (INDEPENDENT_AMBULATORY_CARE_PROVIDER_SITE_OTHER): Payer: 59 | Admitting: Family Medicine

## 2016-11-18 ENCOUNTER — Encounter: Payer: Self-pay | Admitting: Family Medicine

## 2016-11-18 VITALS — BP 110/72 | HR 80 | Temp 98.2°F | Resp 14 | Wt 129.1 lb

## 2016-11-18 DIAGNOSIS — Z9889 Other specified postprocedural states: Secondary | ICD-10-CM | POA: Diagnosis not present

## 2016-11-18 DIAGNOSIS — M545 Low back pain, unspecified: Secondary | ICD-10-CM

## 2016-11-18 DIAGNOSIS — N951 Menopausal and female climacteric states: Secondary | ICD-10-CM | POA: Diagnosis not present

## 2016-11-18 DIAGNOSIS — M1380 Other specified arthritis, unspecified site: Secondary | ICD-10-CM | POA: Diagnosis not present

## 2016-11-18 DIAGNOSIS — G4700 Insomnia, unspecified: Secondary | ICD-10-CM

## 2016-11-18 DIAGNOSIS — F321 Major depressive disorder, single episode, moderate: Secondary | ICD-10-CM | POA: Diagnosis not present

## 2016-11-18 DIAGNOSIS — G8929 Other chronic pain: Secondary | ICD-10-CM

## 2016-11-18 DIAGNOSIS — R439 Unspecified disturbances of smell and taste: Secondary | ICD-10-CM

## 2016-11-18 MED ORDER — ZOLPIDEM TARTRATE ER 12.5 MG PO TBCR: 12.5000 mg | EXTENDED_RELEASE_TABLET | Freq: Every day | ORAL | 0 refills | Status: DC

## 2016-11-18 MED ORDER — QUETIAPINE FUMARATE 25 MG PO TABS
25.0000 mg | ORAL_TABLET | Freq: Every day | ORAL | 0 refills | Status: DC
Start: 2016-11-18 — End: 2017-02-18
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
## 2017-01-20 ENCOUNTER — Other Ambulatory Visit: Payer: Self-pay | Admitting: Family Medicine

## 2017-01-20 DIAGNOSIS — G4700 Insomnia, unspecified: Secondary | ICD-10-CM

## 2017-01-20 NOTE — Telephone Encounter (Signed)
Patient requesting refill of Alprazolam to [facility].  

## 2017-02-18 ENCOUNTER — Ambulatory Visit (INDEPENDENT_AMBULATORY_CARE_PROVIDER_SITE_OTHER): Payer: 59 | Admitting: Family Medicine

## 2017-02-18 ENCOUNTER — Encounter: Payer: Self-pay | Admitting: Family Medicine

## 2017-02-18 VITALS — BP 120/80 | HR 90 | Temp 98.5°F | Resp 16 | Ht 63.0 in | Wt 130.6 lb

## 2017-02-18 DIAGNOSIS — R439 Unspecified disturbances of smell and taste: Secondary | ICD-10-CM | POA: Diagnosis not present

## 2017-02-18 DIAGNOSIS — Z9181 History of falling: Secondary | ICD-10-CM

## 2017-02-18 DIAGNOSIS — F321 Major depressive disorder, single episode, moderate: Secondary | ICD-10-CM | POA: Diagnosis not present

## 2017-02-18 DIAGNOSIS — G8929 Other chronic pain: Secondary | ICD-10-CM

## 2017-02-18 DIAGNOSIS — G4700 Insomnia, unspecified: Secondary | ICD-10-CM

## 2017-02-18 DIAGNOSIS — M545 Low back pain: Secondary | ICD-10-CM

## 2017-02-18 DIAGNOSIS — N951 Menopausal and female climacteric states: Secondary | ICD-10-CM | POA: Diagnosis not present

## 2017-02-18 DIAGNOSIS — M5126 Other intervertebral disc displacement, lumbar region: Secondary | ICD-10-CM

## 2017-02-18 DIAGNOSIS — M1380 Other specified arthritis, unspecified site: Secondary | ICD-10-CM

## 2017-02-18 DIAGNOSIS — M5136 Other intervertebral disc degeneration, lumbar region: Secondary | ICD-10-CM

## 2017-02-18 MED ORDER — ZOLPIDEM TARTRATE ER 12.5 MG PO TBCR: 12.5000 mg | EXTENDED_RELEASE_TABLET | Freq: Every day | ORAL | 0 refills | Status: DC

## 2017-02-18 MED ORDER — CLONIDINE HCL 0.1 MG PO TABS: ORAL_TABLET | ORAL | 1 refills | Status: DC

## 2017-02-18 MED ORDER — TRAMADOL HCL 50 MG PO TABS: 50.0000 mg | ORAL_TABLET | Freq: Two times a day (BID) | ORAL | 0 refills | Status: DC | PRN

## 2017-02-18 MED ORDER — DULOXETINE HCL 60 MG PO CPEP: 120.0000 mg | ORAL_CAPSULE | Freq: Every day | ORAL | 1 refills | Status: DC
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
## 2017-03-31 ENCOUNTER — Other Ambulatory Visit: Payer: Self-pay | Admitting: Family Medicine

## 2017-03-31 DIAGNOSIS — N951 Menopausal and female climacteric states: Secondary | ICD-10-CM

## 2017-04-01 NOTE — Telephone Encounter (Signed)
Patient requesting refill of Clonidine to [facility].

## 2017-05-20 ENCOUNTER — Encounter: Payer: Self-pay | Admitting: Family Medicine

## 2017-05-20 ENCOUNTER — Ambulatory Visit: Payer: 59 | Admitting: Family Medicine

## 2017-05-20 VITALS — BP 108/62 | HR 74 | Temp 98.2°F | Resp 16 | Ht 63.0 in | Wt 129.9 lb

## 2017-05-20 DIAGNOSIS — M1380 Other specified arthritis, unspecified site: Secondary | ICD-10-CM

## 2017-05-20 DIAGNOSIS — R439 Unspecified disturbances of smell and taste: Secondary | ICD-10-CM

## 2017-05-20 DIAGNOSIS — F419 Anxiety disorder, unspecified: Secondary | ICD-10-CM | POA: Diagnosis not present

## 2017-05-20 DIAGNOSIS — N930 Postcoital and contact bleeding: Secondary | ICD-10-CM | POA: Diagnosis not present

## 2017-05-20 DIAGNOSIS — F321 Major depressive disorder, single episode, moderate: Secondary | ICD-10-CM | POA: Diagnosis not present

## 2017-05-20 DIAGNOSIS — M545 Low back pain: Secondary | ICD-10-CM

## 2017-05-20 DIAGNOSIS — Z23 Encounter for immunization: Secondary | ICD-10-CM | POA: Diagnosis not present

## 2017-05-20 DIAGNOSIS — Z9889 Other specified postprocedural states: Secondary | ICD-10-CM | POA: Diagnosis not present

## 2017-05-20 DIAGNOSIS — G8929 Other chronic pain: Secondary | ICD-10-CM | POA: Diagnosis not present

## 2017-05-20 DIAGNOSIS — G4700 Insomnia, unspecified: Secondary | ICD-10-CM

## 2017-05-20 DIAGNOSIS — N951 Menopausal and female climacteric states: Secondary | ICD-10-CM | POA: Diagnosis not present

## 2017-05-20 DIAGNOSIS — M2550 Pain in unspecified joint: Secondary | ICD-10-CM | POA: Diagnosis not present

## 2017-05-20 MED ORDER — ALPRAZOLAM 0.5 MG PO TABS: 0.5000 mg | ORAL_TABLET | Freq: Every evening | ORAL | 0 refills | Status: DC | PRN

## 2017-05-20 MED ORDER — ZOLPIDEM TARTRATE ER 12.5 MG PO TBCR: 12.5000 mg | EXTENDED_RELEASE_TABLET | Freq: Every day | ORAL | 0 refills | Status: DC
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
## 2017-05-20 NOTE — Addendum Note (Signed)
Addended by: Alba Cory F on: 05/20/2017 04:17 PM   Modules accepted: Orders

## 2017-05-26 ENCOUNTER — Encounter: Payer: Self-pay | Admitting: Obstetrics and Gynecology

## 2017-06-01 ENCOUNTER — Ambulatory Visit (INDEPENDENT_AMBULATORY_CARE_PROVIDER_SITE_OTHER): Payer: 59 | Admitting: Obstetrics and Gynecology

## 2017-06-01 ENCOUNTER — Encounter: Payer: Self-pay | Admitting: Obstetrics and Gynecology

## 2017-06-01 VITALS — BP 120/80 | HR 111 | Ht 64.0 in | Wt 133.0 lb

## 2017-06-01 DIAGNOSIS — N898 Other specified noninflammatory disorders of vagina: Secondary | ICD-10-CM

## 2017-06-01 DIAGNOSIS — Z124 Encounter for screening for malignant neoplasm of cervix: Secondary | ICD-10-CM

## 2017-06-01 DIAGNOSIS — N950 Postmenopausal bleeding: Secondary | ICD-10-CM

## 2017-06-01 DIAGNOSIS — Z1151 Encounter for screening for human papillomavirus (HPV): Secondary | ICD-10-CM

## 2017-06-01 DIAGNOSIS — N951 Menopausal and female climacteric states: Secondary | ICD-10-CM

## 2017-06-01 LAB — POCT WET PREP WITH KOH
CLUE CELLS WET PREP PER HPF POC: NEGATIVE
KOH Prep POC: NEGATIVE
TRICHOMONAS UA: NEGATIVE
YEAST WET PREP PER HPF POC: NEGATIVE

## 2017-06-01 NOTE — Progress Notes (Signed)
Chief Complaint  Patient presents with  . Gynecologic Exam    pt states she needs a pap and reports menopausal symptoms    HPI:      [name] is a 56 y.o. G1P1001 who LMP was Patient's last menstrual period was 12/18/2014., presents today for NP eval of postcoital bleeding, referred by PCP Carlynn Purl, Danna Hefty, MD).  Pt is not frequently sex active and was sex active a couple months back. Pt had severe vaginal discomfort and then red vaginal bleeding with small clots for several wks after. She had to change pads Q2-3 hrs. Her LMP was over a year ago. She has vasomotor sx and takes cymbalta and clonidine. She has been sex active again recently and didn't have any bleeding. She used a lubricant the 2nd time with decreased vag dyspareunia. No other pelvic pain/bleeding issues otherwise.   She notes vaginal dryness, even with walking. She uses body wash vaginally, no dryer sheets. No vag d/c, odor, irritation. She has tried replens as a moisturizer.   She is past due for a pap smear. Hx of abn paps in past that were fine with repeat/ without treatment per pt report.    Past Medical History:  Diagnosis Date  . Depression   . Insomnia   . Lumbar herniated disc   . Menopause   . Rheumatoid arthritis (HCC)   . Vitamin D deficiency     Past Surgical History:  Procedure Laterality Date  . BREAST BIOPSY Left 2010   neg  . CESAREAN SECTION    . GANGLION CYST EXCISION    . METACARPOPHALANGEAL JOINT ARTHROPLASTY Right 06/06/2016   Dr. Dayna Barker     Family History  Problem Relation Age of Onset  . Emphysema Mother   . Cancer Father 60       lung  . Cancer Brother        tongue    Social History   Socioeconomic History  . Marital status: Divorced    Spouse name: Not on file  . Number of children: Not on file  . Years of education: Not on file  . Highest education level: Not on file  Social Needs  . Financial resource strain: Not on file  . Food insecurity - worry: Not  on file  . Food insecurity - inability: Not on file  . Transportation needs - medical: Not on file  . Transportation needs - non-medical: Not on file  Occupational History  . Not on file  Tobacco Use  . Smoking status: Never Smoker  . Smokeless tobacco: Never Used  Substance and Sexual Activity  . Alcohol use: No    Alcohol/week: 0.0 oz  . Drug use: No  . Sexual activity: Yes  Other Topics Concern  . Not on file  Social History Narrative  . Not on file     Current Outpatient Medications:  .  ALPRAZolam (XANAX) 0.5 MG tablet, Take 1 tablet (0.5 mg total) at bedtime as needed by mouth., Disp: 30 tablet, Rfl: 0 .  cloNIDine (CATAPRES) 0.1 MG tablet, TAKE 1 TABLET BY MOUTH TWO  TIMES DAILY, Disp: 180 tablet, Rfl: 1 .  DULoxetine (CYMBALTA) 60 MG capsule, Take 2 capsules (120 mg total) by mouth daily., Disp: 180 capsule, Rfl: 1 .  HUMIRA PEN 40 MG/0.8ML PNKT, INJECT Subcutaneously EVERY other WEEK, Disp: , Rfl: 3 .  meloxicam (MOBIC) 15 MG tablet, Take by mouth., Disp: , Rfl:  .  methotrexate 50 MG/2ML injection, Inject into  the skin., Disp: , Rfl:  .  tiZANidine (ZANAFLEX) 4 MG tablet, Take 1 tablet (4 mg total) by mouth every 6 (six) hours as needed for muscle spasms., Disp: 120 tablet, Rfl: 1 .  traMADol (ULTRAM) 50 MG tablet, Take 1 tablet (50 mg total) by mouth every 12 (twelve) hours as needed., Disp: 180 tablet, Rfl: 0 .  zolpidem (AMBIEN CR) 12.5 MG CR tablet, Take 1 tablet (12.5 mg total) at bedtime by mouth., Disp: 90 tablet, Rfl: 0   ROS:  Review of Systems  Constitutional: Negative for fever.  Gastrointestinal: Negative for blood in stool, constipation, diarrhea, nausea and vomiting.  Genitourinary: Positive for dyspareunia, vaginal bleeding and vaginal pain. Negative for dysuria, flank pain, frequency, hematuria, urgency and vaginal discharge.  Musculoskeletal: Negative for back pain.  Skin: Negative for rash.     OBJECTIVE:   Vitals:  BP 120/80   Pulse (!) 111    Ht 5\' 4"  (1.626 m)   Wt 133 lb (60.3 kg)   LMP 12/18/2014   BMI 22.83 kg/m   Physical Exam  Constitutional: She is oriented to person, place, and time and well-developed, well-nourished, and in no distress. Vital signs are normal.  Genitourinary: Uterus normal, cervix normal, right adnexa normal, left adnexa normal and vulva normal. Uterus is not enlarged. Cervix exhibits no motion tenderness and no tenderness. Right adnexum displays no mass and no tenderness. Left adnexum displays no mass and no tenderness. Vulva exhibits no erythema, no exudate, no lesion, no rash and no tenderness. Vagina exhibits no lesion. Thick  odorless  white and vaginal discharge found.  Genitourinary Comments: MILD VAG ATROPHY; TENDER ON SPECULUM EXAM  Neurological: She is oriented to person, place, and time.  Vitals reviewed.   Results: Results for orders placed or performed in visit on 06/01/17 (from the past 24 hour(s))  POCT Wet Prep with KOH     Status: Normal   Collection Time: 06/01/17  4:07 PM  Result Value Ref Range   Trichomonas, UA Negative    Clue Cells Wet Prep HPF POC NEG    Epithelial Wet Prep HPF POC  Few, Moderate, Many, Too numerous to count   Yeast Wet Prep HPF POC NEG    Bacteria Wet Prep HPF POC  Few   RBC Wet Prep HPF POC     WBC Wet Prep HPF POC     KOH Prep POC Negative Negative     Assessment/Plan: PMB (postmenopausal bleeding) - Sx one time only. Neg exam today. Check pap smear, GYN u/s. Will f/u with results. Question uterine vs vaginal etiology. If neg, discussed vag ERT. - Plan: 06/03/17 PELVIS TRANSVANGINAL NON-OB (TV ONLY)  Vaginal dryness, menopausal - Dove sensitive skin soap/coconut or olive oil as moisturizer and lubricant. May need vag ERT.  - Plan: POCT Wet Prep with KOH  Vaginal discharge - Neg wet prep. - Plan: POCT Wet Prep with KOH  Cervical cancer screening - Plan: IGP, Aptima HPV  Screening for HPV (human papillomavirus) - Plan: IGP, Aptima HPV    Return in  about 1 day (around 06/02/2017) for GYN u/s for PMB--[facility] to call pt.  Alicia B. Copland, PA-C 06/01/2017 4:08 PM

## 2017-06-01 NOTE — Patient Instructions (Signed)
I value your feedback and entrusting us with your care. If you get a [facility] patient survey, I would appreciate you taking the time to let us know about your experience today. Thank you! 

## 2017-06-03 LAB — IGP, APTIMA HPV
HPV Aptima: NEGATIVE
PAP Smear Comment: 0

## 2017-06-20 ENCOUNTER — Other Ambulatory Visit: Payer: Self-pay | Admitting: Family Medicine

## 2017-06-20 DIAGNOSIS — G4700 Insomnia, unspecified: Secondary | ICD-10-CM

## 2017-06-20 NOTE — Telephone Encounter (Signed)
Refill request for general medication: Ambien CR  Last office visit: 05/20/2017  Last physical exam: None indicated  Follow up visit: 08/21/2017

## 2017-07-08 ENCOUNTER — Ambulatory Visit
Admission: RE | Admit: 2017-07-08 | Discharge: 2017-07-08 | Disposition: A | Discharge status: Home / Self Care | Payer: Managed Care, Other (non HMO) | Source: Ambulatory Visit | Attending: Nurse Practitioner | Admitting: Nurse Practitioner

## 2017-07-08 ENCOUNTER — Ambulatory Visit: Payer: Managed Care, Other (non HMO) | Attending: Nurse Practitioner | Admitting: Nurse Practitioner

## 2017-07-08 ENCOUNTER — Other Ambulatory Visit: Payer: Self-pay

## 2017-07-08 ENCOUNTER — Encounter: Payer: Self-pay | Admitting: Nurse Practitioner

## 2017-07-08 VITALS — BP 156/92 | HR 79 | Temp 98.0°F | Resp 16 | Ht 64.0 in | Wt 130.0 lb

## 2017-07-08 DIAGNOSIS — M5441 Lumbago with sciatica, right side: Principal | ICD-10-CM

## 2017-07-08 DIAGNOSIS — M5442 Lumbago with sciatica, left side: Principal | ICD-10-CM

## 2017-07-08 DIAGNOSIS — M25532 Pain in left wrist: Secondary | ICD-10-CM | POA: Insufficient documentation

## 2017-07-08 DIAGNOSIS — M899 Disorder of bone, unspecified: Secondary | ICD-10-CM | POA: Insufficient documentation

## 2017-07-08 DIAGNOSIS — M069 Rheumatoid arthritis, unspecified: Secondary | ICD-10-CM | POA: Diagnosis not present

## 2017-07-08 DIAGNOSIS — M25561 Pain in right knee: Secondary | ICD-10-CM

## 2017-07-08 DIAGNOSIS — G8929 Other chronic pain: Secondary | ICD-10-CM

## 2017-07-08 DIAGNOSIS — M79604 Pain in right leg: Secondary | ICD-10-CM

## 2017-07-08 DIAGNOSIS — G894 Chronic pain syndrome: Secondary | ICD-10-CM | POA: Diagnosis present

## 2017-07-08 DIAGNOSIS — M25562 Pain in left knee: Secondary | ICD-10-CM | POA: Diagnosis not present

## 2017-07-08 DIAGNOSIS — M79671 Pain in right foot: Secondary | ICD-10-CM | POA: Insufficient documentation

## 2017-07-08 DIAGNOSIS — M25531 Pain in right wrist: Secondary | ICD-10-CM | POA: Diagnosis not present

## 2017-07-08 DIAGNOSIS — Z885 Allergy status to narcotic agent status: Secondary | ICD-10-CM | POA: Diagnosis not present

## 2017-07-08 DIAGNOSIS — M79672 Pain in left foot: Secondary | ICD-10-CM | POA: Insufficient documentation

## 2017-07-08 DIAGNOSIS — Z79899 Other long term (current) drug therapy: Secondary | ICD-10-CM | POA: Diagnosis not present

## 2017-07-08 DIAGNOSIS — Z79891 Long term (current) use of opiate analgesic: Secondary | ICD-10-CM | POA: Diagnosis not present

## 2017-07-08 DIAGNOSIS — M47896 Other spondylosis, lumbar region: Secondary | ICD-10-CM | POA: Insufficient documentation

## 2017-07-08 DIAGNOSIS — Z789 Other specified health status: Secondary | ICD-10-CM | POA: Insufficient documentation

## 2017-07-08 DIAGNOSIS — M79605 Pain in left leg: Secondary | ICD-10-CM

## 2017-07-08 DIAGNOSIS — F329 Major depressive disorder, single episode, unspecified: Secondary | ICD-10-CM | POA: Insufficient documentation

## 2017-07-08 NOTE — Progress Notes (Signed)
Safety precautions to be maintained throughout the outpatient stay will include: orient to surroundings, keep bed in low position, maintain call bell within reach at all times, provide assistance with transfer out of bed and ambulation.  

## 2017-07-08 NOTE — Patient Instructions (Addendum)
You have been instructed to get labwork, xrays and med psych visit.  You had no further questions ____________________________________________________________________________________________  Appointment Policy Summary  It is our goal and responsibility to provide the medical community with assistance in the evaluation and management of patients with chronic pain. Unfortunately our resources are limited. Because we do not have an unlimited amount of time, or available appointments, we are required to closely monitor and manage their use. The following rules exist to maximize their use:  Patient's responsibilities: 1. Punctuality:  At what time should I arrive? You should be physically present in our office 30 minutes before your scheduled appointment. Your scheduled appointment is with your assigned healthcare provider. However, it takes 5-10 minutes to be "checked-in", and another 15 minutes for the nurses to do the admission. If you arrive to our office at the time you were given for your appointment, you will end up being at least 20-25 minutes late to your appointment with the provider. 2. Tardiness:  What happens if I arrive only a few minutes after my scheduled appointment time? You will need to reschedule your appointment. The cutoff is your appointment time. This is why it is so important that you arrive at least 30 minutes before that appointment. If you have an appointment scheduled for 10:00 AM and you arrive at 10:01, you will be required to reschedule your appointment.  3. Plan ahead:  Always assume that you will encounter traffic on your way in. Plan for it. If you are dependent on a driver, make sure they understand these rules and the need to arrive early. 4. Other appointments and responsibilities:  Avoid scheduling any other appointments before or after your pain clinic appointments.  5. Be prepared:  Write down everything that you need to discuss with your healthcare provider and  give this information to the admitting nurse. Write down the medications that you will need refilled. Bring your pills and bottles (even the empty ones), to all of your appointments, except for those where a procedure is scheduled. 6. No children or pets:  Find someone to take care of them. It is not appropriate to bring them in. 7. Scheduling changes:  We request "advanced notification" of any changes or cancellations. 8. Advanced notification:  Defined as a time period of more than 24 hours prior to the originally scheduled appointment. This allows for the appointment to be offered to other patients. 9. Rescheduling:  When a visit is rescheduled, it will require the cancellation of the original appointment. For this reason they both fall within the category of "Cancellations".  10. Cancellations:  They require advanced notification. Any cancellation less than 24 hours before the  appointment will be recorded as a "No Show". 11. No Show:  Defined as an unkept appointment where the patient failed to notify or declare to the practice their intention or inability to keep the appointment.  Corrective process for repeat offenders:  1. Tardiness: Three (3) episodes of rescheduling due to late arrivals will be recorded as one (1) "No Show". 2. Cancellation or reschedule: Three (3) cancellations or rescheduling will be recorded as one (1) "No Show". 3. "No Shows": Three (3) "No Shows" within a 12 month period will result in discharge from the practice.  ____________________________________________________________________________________________   ____________________________________________________________________________________________  Pain Scale  Introduction: The pain score used by this practice is the Verbal Numerical Rating Scale (VNRS-11). This is an 11-point scale. It is for adults and children 10 years or older. There are significant  differences in how the pain score is reported, used,  and applied. Forget everything you learned in the past and learn this scoring system.  General Information: The scale should reflect your current level of pain. Unless you are specifically asked for the level of your worst pain, or your average pain. If you are asked for one of these two, then it should be understood that it is over the past 24 hours.  Basic Activities of Daily Living (ADL): Personal hygiene, dressing, eating, transferring, and using restroom.  Instructions: Most patients tend to report their level of pain as a combination of two factors, their physical pain and their psychosocial pain. This last one is also known as "suffering" and it is reflection of how physical pain affects you socially and psychologically. From now on, report them separately. From this point on, when asked to report your pain level, report only your physical pain. Use the following table for reference.  Pain Clinic Pain Levels (0-5/10)  Pain Level Score  Description  No Pain 0   Mild pain 1 Nagging, annoying, but does not interfere with basic activities of daily living (ADL). Patients are able to eat, bathe, get dressed, toileting (being able to get on and off the toilet and perform personal hygiene functions), transfer (move in and out of bed or a chair without assistance), and maintain continence (able to control bladder and bowel functions). Blood pressure and heart rate are unaffected. A normal heart rate for a healthy adult ranges from 60 to 100 bpm (beats per minute).   Mild to moderate pain 2 Noticeable and distracting. Impossible to hide from other people. More frequent flare-ups. Still possible to adapt and function close to normal. It can be very annoying and may have occasional stronger flare-ups. With discipline, patients may get used to it and adapt.   Moderate pain 3 Interferes significantly with activities of daily living (ADL). It becomes difficult to feed, bathe, get dressed, get on and off the  toilet or to perform personal hygiene functions. Difficult to get in and out of bed or a chair without assistance. Very distracting. With effort, it can be ignored when deeply involved in activities.   Moderately severe pain 4 Impossible to ignore for more than a few minutes. With effort, patients may still be able to manage work or participate in some social activities. Very difficult to concentrate. Signs of autonomic nervous system discharge are evident: dilated pupils (mydriasis); mild sweating (diaphoresis); sleep interference. Heart rate becomes elevated (>115 bpm). Diastolic blood pressure (lower number) rises above 100 mmHg. Patients find relief in laying down and not moving.   Severe pain 5 Intense and extremely unpleasant. Associated with frowning face and frequent crying. Pain overwhelms the senses.  Ability to do any activity or maintain social relationships becomes significantly limited. Conversation becomes difficult. Pacing back and forth is common, as getting into a comfortable position is nearly impossible. Pain wakes you up from deep sleep. Physical signs will be obvious: pupillary dilation; increased sweating; goosebumps; brisk reflexes; cold, clammy hands and feet; nausea, vomiting or dry heaves; loss of appetite; significant sleep disturbance with inability to fall asleep or to remain asleep. When persistent, significant weight loss is observed due to the complete loss of appetite and sleep deprivation.  Blood pressure and heart rate becomes significantly elevated. Caution: If elevated blood pressure triggers a pounding headache associated with blurred vision, then the patient should immediately seek attention at an urgent or emergency care unit, as these may  be signs of an impending stroke.    Emergency Department Pain Levels (6-10/10)  Emergency Room Pain 6 Severely limiting. Requires emergency care and should not be seen or managed at an outpatient pain management facility.  Communication becomes difficult and requires great effort. Assistance to reach the emergency department may be required. Facial flushing and profuse sweating along with potentially dangerous increases in heart rate and blood pressure will be evident.   Distressing pain 7 Self-care is very difficult. Assistance is required to transport, or use restroom. Assistance to reach the emergency department will be required. Tasks requiring coordination, such as bathing and getting dressed become very difficult.   Disabling pain 8 Self-care is no longer possible. At this level, pain is disabling. The individual is unable to do even the most "basic" activities such as walking, eating, bathing, dressing, transferring to a bed, or toileting. Fine motor skills are lost. It is difficult to think clearly.   Incapacitating pain 9 Pain becomes incapacitating. Thought processing is no longer possible. Difficult to remember your own name. Control of movement and coordination are lost.   The worst pain imaginable 10 At this level, most patients pass out from pain. When this level is reached, collapse of the autonomic nervous system occurs, leading to a sudden drop in blood pressure and heart rate. This in turn results in a temporary and dramatic drop in blood flow to the brain, leading to a loss of consciousness. Fainting is one of the body's self defense mechanisms. Passing out puts the brain in a calmed state and causes it to shut down for a while, in order to begin the healing process.    Summary: 1. Refer to this scale when providing [place] with your pain level. 2. Be accurate and careful when reporting your pain level. This will help with your care. 3. Over-reporting your pain level will lead to loss of credibility. 4. Even a level of 1/10 means that there is pain and will be treated at our facility. 5. High, inaccurate reporting will be documented as "Symptom Exaggeration", leading to loss of credibility and suspicions  of possible secondary gains such as obtaining more narcotics, or wanting to appear disabled, for fraudulent reasons. 6. Only pain levels of 5 or below will be seen at our facility. 7. Pain levels of 6 and above will be sent to the Emergency Department and the appointment cancelled. ____________________________________________________________________________________________

## 2017-07-08 NOTE — Progress Notes (Signed)
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 for the nurses to do the admission. If you arrive to our office at the time you were given for your appointment, you will end up being at least 20-25 minutes late to your appointment with the provider. 2. Tardiness:  What happens if I arrive only a few minutes after my scheduled appointment time? You will need to reschedule your appointment. The cutoff is your appointment time. This is why it is so important that you arrive at least 30 minutes before that appointment. If you have an appointment scheduled for 10:00 AM and you arrive at 10:01, you will be required to reschedule your appointment.  3. Plan ahead:  Always assume that you will encounter traffic on your way in. Plan for it. If you are dependent on a driver, make sure they understand these rules and the need to arrive early. 4. Other appointments and responsibilities:  Avoid scheduling any other appointments before or after your pain clinic appointments.  5. Be prepared:  Write down everything that you need to discuss with your healthcare provider and give this information to the admitting nurse. Write  down the medications that you will need refilled. Bring your pills and bottles (even the empty ones), to all of your appointments, except for those where a procedure is scheduled. 6. No children or pets:  Find someone to take care of them. It is not appropriate to bring them in. 7. Scheduling changes:  We request "advanced notification" of any changes or cancellations. 8. Advanced notification:  Defined as a time period of more than 24 hours prior to the originally scheduled appointment. This allows for the appointment to be offered to other patients. 9. Rescheduling:  When a visit is rescheduled, it will require the cancellation of the original appointment. For  this reason they both fall within the category of "Cancellations".  10. Cancellations:  They require advanced notification. Any cancellation less than 24 hours before the  appointment will be recorded as a "No Show". 11. No Show:  Defined as an unkept appointment where the patient failed to notify or declare to the practice their intention or inability to keep the appointment.  Corrective process for repeat offenders:  1. Tardiness: Three (3) episodes of rescheduling due to late arrivals will be recorded as one (1) "No Show". 2. Cancellation or reschedule: Three (3) cancellations or rescheduling will be recorded as one (1) "No Show". 3. "No Shows": Three (3) "No Shows" within a 12 month period will result in discharge from the practice.  ____________________________________________________________________________________________   ____________________________________________________________________________________________  Pain Scale  Introduction: The pain score used by this practice is the Verbal Numerical Rating Scale (VNRS-11). This is an 11-point scale. It is for adults and children 10 years or older. There are significant differences in how the pain score is reported, used, and applied. Forget everything you learned in the  past and learn this scoring system.  General Information: The scale should reflect your current level of pain. Unless you are specifically asked for the level of your worst pain, or your average pain. If you are asked for one of these two, then it should be understood that it is over the past 24 hours.  Basic Activities of Daily Living (ADL): Personal hygiene, dressing, eating, transferring, and using restroom.  Instructions: Most patients tend to report their level of pain as a combination of two factors, their physical pain and their psychosocial pain. This last one is also known as "suffering" and it is reflection of how physical pain affects you socially and psychologically. From now on, report them separately. From this point on, when asked to report your pain level, report only your physical pain. Use the following table for reference.  Pain Clinic Pain Levels (0-5/10)  Pain Level Score  Description  No Pain 0   Mild pain 1 Nagging, annoying, but does not interfere with basic activities of daily living (ADL). Patients are able to eat, bathe, get dressed, toileting (being able to get on and off the toilet and perform personal hygiene functions), transfer (move in and out of bed or a chair without assistance), and maintain continence (able to control bladder and bowel functions). Blood pressure and heart rate are unaffected. A normal heart rate for a healthy adult ranges from 60 to 100 bpm (beats per minute).   Mild to moderate pain 2 Noticeable and distracting. Impossible to hide from other people. More frequent flare-ups. Still possible to adapt and function close to normal. It can be very annoying and may have occasional stronger flare-ups. With discipline, patients may get used to it and adapt.   Moderate pain 3 Interferes significantly with activities of daily living (ADL). It becomes difficult to feed, bathe, get dressed, get on and off the toilet or to perform personal hygiene functions.  Difficult to get in and out of bed or a chair without assistance. Very distracting. With effort, it can be ignored when deeply involved in activities.   Moderately severe pain 4 Impossible to ignore for more than a few minutes. With effort, patients may still be able to manage work or participate in some social activities. Very difficult to concentrate. Signs of autonomic nervous system discharge are evident: dilated pupils (mydriasis); mild sweating (diaphoresis); sleep interference. Heart rate becomes elevated (>115 bpm). Diastolic blood  pressure (lower number) rises above 100 mmHg. Patients find relief in laying down and not moving.   Severe pain 5 Intense and extremely unpleasant. Associated with frowning face and frequent crying. Pain overwhelms the senses.  Ability to do any activity or maintain social relationships becomes significantly limited. Conversation becomes difficult. Pacing back and forth is common, as getting into a comfortable position is nearly impossible. Pain wakes you up from deep sleep. Physical signs will be obvious: pupillary dilation; increased sweating; goosebumps; brisk reflexes; cold, clammy hands and feet; nausea, vomiting or dry heaves; loss of appetite; significant sleep disturbance with inability to fall asleep or to remain asleep. When persistent, significant weight loss is observed due to the complete loss of appetite and sleep deprivation.  Blood pressure and heart rate becomes significantly elevated. Caution: If elevated blood pressure triggers a pounding headache associated with blurred vision, then the patient should immediately seek attention at an urgent or emergency care unit, as these may be signs of an impending stroke.    Emergency Department Pain Levels (6-10/10)  Emergency Room Pain 6 Severely limiting. Requires emergency care and should not be seen or managed at an outpatient pain management facility. Communication becomes difficult and requires great  effort. Assistance to reach the emergency department may be required. Facial flushing and profuse sweating along with potentially dangerous increases in heart rate and blood pressure will be evident.   Distressing pain 7 Self-care is very difficult. Assistance is required to transport, or use restroom. Assistance to reach the emergency department will be required. Tasks requiring coordination, such as bathing and getting dressed become very difficult.   Disabling pain 8 Self-care is no longer possible. At this level, pain is disabling. The individual is unable to do even the most "basic" activities such as walking, eating, bathing, dressing, transferring to a bed, or toileting. Fine motor skills are lost. It is difficult to think clearly.   Incapacitating pain 9 Pain becomes incapacitating. Thought processing is no longer possible. Difficult to remember your own name. Control of movement and coordination are lost.   The worst pain imaginable 10 At this level, most patients pass out from pain. When this level is reached, collapse of the autonomic nervous system occurs, leading to a sudden drop in blood pressure and heart rate. This in turn results in a temporary and dramatic drop in blood flow to the brain, leading to a loss of consciousness. Fainting is one of the body's self defense mechanisms. Passing out puts the brain in a calmed state and causes it to shut down for a while, in order to begin the healing process.    Summary: 1. Refer to this scale when providing [place] with your pain level. 2. Be accurate and careful when reporting your pain level. This will help with your care. 3. Over-reporting your pain level will lead to loss of credibility. 4. Even a level of 1/10 means that there is pain and will be treated at our facility. 5. High, inaccurate reporting will be documented as "Symptom Exaggeration", leading to loss of credibility and suspicions of possible secondary gains such as obtaining more  narcotics, or wanting to appear disabled, for fraudulent reasons. 6. Only pain levels of 5 or below will be seen at our facility. 7. Pain levels of 6 and above will be sent to the Emergency Department and the appointment cancelled. ____________________________________________________________________________________________

## 2017-07-09 ENCOUNTER — Other Ambulatory Visit: Payer: Self-pay | Admitting: Nurse Practitioner

## 2017-07-09 NOTE — Progress Notes (Signed)
Results were reviewed and found to be: mildly abnormal  No acute injury or pathology identified  Review would suggest interventional pain management techniques may be of benefit 

## 2017-07-10 LAB — URIC ACID: URIC ACID: 2.5 mg/dL (ref 2.5–7.1)

## 2017-07-13 ENCOUNTER — Ambulatory Visit (INDEPENDENT_AMBULATORY_CARE_PROVIDER_SITE_OTHER): Payer: Managed Care, Other (non HMO)

## 2017-07-13 ENCOUNTER — Telehealth: Payer: Self-pay | Admitting: Obstetrics and Gynecology

## 2017-07-13 DIAGNOSIS — N950 Postmenopausal bleeding: Secondary | ICD-10-CM | POA: Diagnosis not present

## 2017-07-13 DIAGNOSIS — N952 Postmenopausal atrophic vaginitis: Secondary | ICD-10-CM

## 2017-07-14 ENCOUNTER — Other Ambulatory Visit: Payer: Self-pay | Admitting: Obstetrics & Gynecology

## 2017-07-14 LAB — COMP. METABOLIC PANEL (12)
A/G RATIO: 1.8 (ref 1.2–2.2)
ALK PHOS: 133 IU/L — AB (ref 39–117)
AST: 17 IU/L (ref 0–40)
Albumin: 4.4 g/dL (ref 3.5–5.5)
BILIRUBIN TOTAL: 0.3 mg/dL (ref 0.0–1.2)
BUN / CREAT RATIO: 17 (ref 9–23)
BUN: 11 mg/dL (ref 6–24)
CHLORIDE: 103 mmol/L (ref 96–106)
Calcium: 9 mg/dL (ref 8.7–10.2)
Creatinine, Ser: 0.66 mg/dL (ref 0.57–1.00)
GFR calc Af Amer: 114 mL/min/{1.73_m2} (ref >59)
GFR calc non Af Amer: 99 mL/min/{1.73_m2} (ref >59)
Globulin, Total: 2.4 g/dL (ref 1.5–4.5)
Glucose: 74 mg/dL (ref 65–99)
Potassium: 4.2 mmol/L (ref 3.5–5.2)
Sodium: 140 mmol/L (ref 134–144)
TOTAL PROTEIN: 6.8 g/dL (ref 6.0–8.5)

## 2017-07-14 LAB — C-REACTIVE PROTEIN: CRP: 2.2 mg/L (ref 0.0–4.9)

## 2017-07-14 LAB — 25-HYDROXYVITAMIN D LCMS D2+D3: 25-HYDROXY, VITAMIN D: 24 ng/mL — AB

## 2017-07-14 LAB — SEDIMENTATION RATE

## 2017-07-14 LAB — 25-HYDROXY VITAMIN D LCMS D2+D3
25-Hydroxy, Vitamin D-2: <1 ng/mL
25-Hydroxy, Vitamin D-3: 24 ng/mL

## 2017-07-14 LAB — COMPLIANCE DRUG ANALYSIS, UR

## 2017-07-14 LAB — VITAMIN B12: VITAMIN B 12: 310 pg/mL (ref 232–1245)

## 2017-07-14 LAB — MAGNESIUM: MAGNESIUM: 2.1 mg/dL (ref 1.6–2.3)

## 2017-07-14 MED ORDER — ESTRADIOL 0.1 MG/GM VA CREA: 1.0000 | TOPICAL_CREAM | Freq: Every day | VAGINAL | 1 refills | Status: DC

## 2017-07-14 NOTE — Progress Notes (Signed)
Review of ULTRASOUND.    I have personally reviewed images and report of recent ultrasound done at [facility].    Plan of management to be discussed with pa Copland.     Small fibroid, thin ES lining. Discussed w PA Copland, monitoring for sx's; EMB only if further bleeding.  Annamarie Major, MD, [facility], [facility] 07/14/2017  7:46 AM

## 2017-07-14 NOTE — Telephone Encounter (Signed)
Pt aware of neg u/s. No more vag bleeding sx. Still has vag dryness with sex. Will try Rx estrace crm. F/u prn bleeding sx.

## 2017-07-24 HISTORY — PX: FINGER ARTHROPLASTY: SHX5017

## 2017-08-04 ENCOUNTER — Other Ambulatory Visit: Payer: Self-pay | Admitting: Family Medicine

## 2017-08-04 DIAGNOSIS — F419 Anxiety disorder, unspecified: Secondary | ICD-10-CM

## 2017-08-04 NOTE — Telephone Encounter (Signed)
Refill request for general medication: Xanax 0.5 mg   Last office visit: 05/20/2017  Last physical exam: None indicated  Follow-up on file. 08/21/2017

## 2017-08-05 NOTE — Telephone Encounter (Signed)
Please call and inform patient that refills will be prescribed for her at follow up visit.

## 2017-08-05 NOTE — Telephone Encounter (Signed)
LVM to inform pt.

## 2017-08-05 NOTE — Telephone Encounter (Signed)
Patient informed. 

## 2017-08-21 ENCOUNTER — Ambulatory Visit: Payer: 59 | Admitting: Family Medicine

## 2017-08-21 ENCOUNTER — Encounter: Payer: Self-pay | Admitting: Family Medicine

## 2017-08-21 VITALS — BP 138/88 | HR 84 | Temp 98.2°F | Resp 16 | Ht 64.0 in | Wt 131.9 lb

## 2017-08-21 DIAGNOSIS — G8929 Other chronic pain: Secondary | ICD-10-CM

## 2017-08-21 DIAGNOSIS — M1380 Other specified arthritis, unspecified site: Secondary | ICD-10-CM

## 2017-08-21 DIAGNOSIS — N951 Menopausal and female climacteric states: Secondary | ICD-10-CM | POA: Diagnosis not present

## 2017-08-21 DIAGNOSIS — M5126 Other intervertebral disc displacement, lumbar region: Secondary | ICD-10-CM

## 2017-08-21 DIAGNOSIS — M358 Other specified systemic involvement of connective tissue: Secondary | ICD-10-CM

## 2017-08-21 DIAGNOSIS — R439 Unspecified disturbances of smell and taste: Secondary | ICD-10-CM

## 2017-08-21 DIAGNOSIS — G4700 Insomnia, unspecified: Secondary | ICD-10-CM | POA: Diagnosis not present

## 2017-08-21 DIAGNOSIS — K743 Primary biliary cirrhosis: Secondary | ICD-10-CM

## 2017-08-21 DIAGNOSIS — M2550 Pain in unspecified joint: Secondary | ICD-10-CM

## 2017-08-21 DIAGNOSIS — F321 Major depressive disorder, single episode, moderate: Secondary | ICD-10-CM

## 2017-08-21 DIAGNOSIS — M545 Low back pain: Secondary | ICD-10-CM | POA: Diagnosis not present

## 2017-08-21 DIAGNOSIS — M5136 Other intervertebral disc degeneration, lumbar region: Secondary | ICD-10-CM

## 2017-08-21 MED ORDER — ZOLPIDEM TARTRATE ER 12.5 MG PO TBCR: 12.5000 mg | EXTENDED_RELEASE_TABLET | Freq: Every day | ORAL | 0 refills | Status: DC

## 2017-08-21 MED ORDER — DILTIAZEM HCL 30 MG PO TABS: 30.0000 mg | ORAL_TABLET | Freq: Four times a day (QID) | ORAL | 0 refills | Status: DC
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
## 2017-08-31 ENCOUNTER — Other Ambulatory Visit: Payer: Self-pay | Admitting: Family Medicine

## 2017-08-31 DIAGNOSIS — N951 Menopausal and female climacteric states: Secondary | ICD-10-CM

## 2017-09-01 NOTE — Telephone Encounter (Signed)
Refill request for general medication: Clonidine 0.1 mg  Last office visit: 08/21/2017  Last physical exam: None indicated  Follow-up on file. 11/18/2017

## 2017-09-21 ENCOUNTER — Encounter: Payer: Self-pay | Admitting: Pain Medicine

## 2017-09-21 ENCOUNTER — Ambulatory Visit: Payer: Managed Care, Other (non HMO) | Attending: Pain Medicine | Admitting: Pain Medicine

## 2017-09-21 ENCOUNTER — Other Ambulatory Visit: Payer: Self-pay

## 2017-09-21 VITALS — BP 155/92 | HR 79 | Temp 98.2°F | Resp 16 | Ht 64.0 in | Wt 130.0 lb

## 2017-09-21 DIAGNOSIS — M47817 Spondylosis without myelopathy or radiculopathy, lumbosacral region: Secondary | ICD-10-CM

## 2017-09-21 DIAGNOSIS — M064 Inflammatory polyarthropathy: Secondary | ICD-10-CM | POA: Diagnosis not present

## 2017-09-21 DIAGNOSIS — M4310 Spondylolisthesis, site unspecified: Secondary | ICD-10-CM | POA: Insufficient documentation

## 2017-09-21 DIAGNOSIS — M25552 Pain in left hip: Secondary | ICD-10-CM | POA: Insufficient documentation

## 2017-09-21 DIAGNOSIS — M7918 Myalgia, other site: Secondary | ICD-10-CM | POA: Diagnosis not present

## 2017-09-21 DIAGNOSIS — M79604 Pain in right leg: Secondary | ICD-10-CM | POA: Diagnosis not present

## 2017-09-21 DIAGNOSIS — M4316 Spondylolisthesis, lumbar region: Secondary | ICD-10-CM | POA: Diagnosis not present

## 2017-09-21 DIAGNOSIS — M25532 Pain in left wrist: Secondary | ICD-10-CM | POA: Diagnosis not present

## 2017-09-21 DIAGNOSIS — M47897 Other spondylosis, lumbosacral region: Secondary | ICD-10-CM | POA: Diagnosis not present

## 2017-09-21 DIAGNOSIS — M48061 Spinal stenosis, lumbar region without neurogenic claudication: Secondary | ICD-10-CM | POA: Insufficient documentation

## 2017-09-21 DIAGNOSIS — M5126 Other intervertebral disc displacement, lumbar region: Secondary | ICD-10-CM | POA: Insufficient documentation

## 2017-09-21 DIAGNOSIS — M25531 Pain in right wrist: Secondary | ICD-10-CM | POA: Diagnosis not present

## 2017-09-21 DIAGNOSIS — Z79899 Other long term (current) drug therapy: Secondary | ICD-10-CM | POA: Diagnosis not present

## 2017-09-21 DIAGNOSIS — M5441 Lumbago with sciatica, right side: Secondary | ICD-10-CM

## 2017-09-21 DIAGNOSIS — M25561 Pain in right knee: Secondary | ICD-10-CM | POA: Diagnosis not present

## 2017-09-21 DIAGNOSIS — G8929 Other chronic pain: Secondary | ICD-10-CM | POA: Insufficient documentation

## 2017-09-21 DIAGNOSIS — M9983 Other biomechanical lesions of lumbar region: Secondary | ICD-10-CM | POA: Diagnosis not present

## 2017-09-21 DIAGNOSIS — M5136 Other intervertebral disc degeneration, lumbar region: Secondary | ICD-10-CM | POA: Insufficient documentation

## 2017-09-21 DIAGNOSIS — M47896 Other spondylosis, lumbar region: Secondary | ICD-10-CM | POA: Diagnosis not present

## 2017-09-21 DIAGNOSIS — E559 Vitamin D deficiency, unspecified: Secondary | ICD-10-CM | POA: Diagnosis not present

## 2017-09-21 DIAGNOSIS — M25512 Pain in left shoulder: Secondary | ICD-10-CM | POA: Insufficient documentation

## 2017-09-21 DIAGNOSIS — F329 Major depressive disorder, single episode, unspecified: Secondary | ICD-10-CM | POA: Diagnosis not present

## 2017-09-21 DIAGNOSIS — M79605 Pain in left leg: Secondary | ICD-10-CM

## 2017-09-21 DIAGNOSIS — M1380 Other specified arthritis, unspecified site: Secondary | ICD-10-CM

## 2017-09-21 DIAGNOSIS — M545 Low back pain, unspecified: Secondary | ICD-10-CM | POA: Insufficient documentation

## 2017-09-21 DIAGNOSIS — G894 Chronic pain syndrome: Secondary | ICD-10-CM | POA: Insufficient documentation

## 2017-09-21 DIAGNOSIS — M5442 Lumbago with sciatica, left side: Secondary | ICD-10-CM

## 2017-09-21 DIAGNOSIS — G4700 Insomnia, unspecified: Secondary | ICD-10-CM | POA: Insufficient documentation

## 2017-09-21 DIAGNOSIS — M25551 Pain in right hip: Secondary | ICD-10-CM | POA: Insufficient documentation

## 2017-09-21 DIAGNOSIS — M25562 Pain in left knee: Secondary | ICD-10-CM | POA: Diagnosis not present

## 2017-09-21 DIAGNOSIS — M25511 Pain in right shoulder: Secondary | ICD-10-CM | POA: Diagnosis not present

## 2017-09-21 DIAGNOSIS — M79671 Pain in right foot: Secondary | ICD-10-CM | POA: Diagnosis present

## 2017-09-21 DIAGNOSIS — M792 Neuralgia and neuritis, unspecified: Secondary | ICD-10-CM

## 2017-09-21 DIAGNOSIS — M47816 Spondylosis without myelopathy or radiculopathy, lumbar region: Secondary | ICD-10-CM | POA: Diagnosis not present

## 2017-09-21 DIAGNOSIS — Z789 Other specified health status: Secondary | ICD-10-CM | POA: Insufficient documentation

## 2017-09-21 MED ORDER — TRAMADOL HCL 50 MG PO TABS: 50.0000 mg | ORAL_TABLET | Freq: Two times a day (BID) | ORAL | 0 refills | Status: DC | PRN

## 2017-09-21 NOTE — Patient Instructions (Addendum)
_____________You have been given a script for Tramadol x 1 today.  You have been given pre procedure instructions. _______________________________________________________________________________  Pain Scale  Introduction: The pain score used by this practice is the Verbal Numerical Rating Scale (VNRS-11). This is an 11-point scale. It is for adults and children 10 years or older. There are significant differences in how the pain score is reported, used, and applied. Forget everything you learned in the past and learn this scoring system.  General Information: The scale should reflect your current level of pain. Unless you are specifically asked for the level of your worst pain, or your average pain. If you are asked for one of these two, then it should be understood that it is over the past 24 hours.  Basic Activities of Daily Living (ADL): Personal hygiene, dressing, eating, transferring, and using restroom.  Instructions: Most patients tend to report their level of pain as a combination of two factors, their physical pain and their psychosocial pain. This last one is also known as "suffering" and it is reflection of how physical pain affects you socially and psychologically. From now on, report them separately. From this point on, when asked to report your pain level, report only your physical pain. Use the following table for reference.  Pain Clinic Pain Levels (0-5/10)  Pain Level Score  Description  No Pain 0   Mild pain 1 Nagging, annoying, but does not interfere with basic activities of daily living (ADL). Patients are able to eat, bathe, get dressed, toileting (being able to get on and off the toilet and perform personal hygiene functions), transfer (move in and out of bed or a chair without assistance), and maintain continence (able to control bladder and bowel functions). Blood pressure and heart rate are unaffected. A normal heart rate for a healthy adult ranges from 60 to 100 bpm (beats  per minute).   Mild to moderate pain 2 Noticeable and distracting. Impossible to hide from other people. More frequent flare-ups. Still possible to adapt and function close to normal. It can be very annoying and may have occasional stronger flare-ups. With discipline, patients may get used to it and adapt.   Moderate pain 3 Interferes significantly with activities of daily living (ADL). It becomes difficult to feed, bathe, get dressed, get on and off the toilet or to perform personal hygiene functions. Difficult to get in and out of bed or a chair without assistance. Very distracting. With effort, it can be ignored when deeply involved in activities.   Moderately severe pain 4 Impossible to ignore for more than a few minutes. With effort, patients may still be able to manage work or participate in some social activities. Very difficult to concentrate. Signs of autonomic nervous system discharge are evident: dilated pupils (mydriasis); mild sweating (diaphoresis); sleep interference. Heart rate becomes elevated (>115 bpm). Diastolic blood pressure (lower number) rises above 100 mmHg. Patients find relief in laying down and not moving.   Severe pain 5 Intense and extremely unpleasant. Associated with frowning face and frequent crying. Pain overwhelms the senses.  Ability to do any activity or maintain social relationships becomes significantly limited. Conversation becomes difficult. Pacing back and forth is common, as getting into a comfortable position is nearly impossible. Pain wakes you up from deep sleep. Physical signs will be obvious: pupillary dilation; increased sweating; goosebumps; brisk reflexes; cold, clammy hands and feet; nausea, vomiting or dry heaves; loss of appetite; significant sleep disturbance with inability to fall asleep or to remain asleep.  When persistent, significant weight loss is observed due to the complete loss of appetite and sleep deprivation.  Blood pressure and heart rate  becomes significantly elevated. Caution: If elevated blood pressure triggers a pounding headache associated with blurred vision, then the patient should immediately seek attention at an urgent or emergency care unit, as these may be signs of an impending stroke.    Emergency Department Pain Levels (6-10/10)  Emergency Room Pain 6 Severely limiting. Requires emergency care and should not be seen or managed at an outpatient pain management facility. Communication becomes difficult and requires great effort. Assistance to reach the emergency department may be required. Facial flushing and profuse sweating along with potentially dangerous increases in heart rate and blood pressure will be evident.   Distressing pain 7 Self-care is very difficult. Assistance is required to transport, or use restroom. Assistance to reach the emergency department will be required. Tasks requiring coordination, such as bathing and getting dressed become very difficult.   Disabling pain 8 Self-care is no longer possible. At this level, pain is disabling. The individual is unable to do even the most "basic" activities such as walking, eating, bathing, dressing, transferring to a bed, or toileting. Fine motor skills are lost. It is difficult to think clearly.   Incapacitating pain 9 Pain becomes incapacitating. Thought processing is no longer possible. Difficult to remember your own name. Control of movement and coordination are lost.   The worst pain imaginable 10 At this level, most patients pass out from pain. When this level is reached, collapse of the autonomic nervous system occurs, leading to a sudden drop in blood pressure and heart rate. This in turn results in a temporary and dramatic drop in blood flow to the brain, leading to a loss of consciousness. Fainting is one of the body's self defense mechanisms. Passing out puts the brain in a calmed state and causes it to shut down for a while, in order to begin the healing  process.    Summary: 1. Refer to this scale when providing [place] with your pain level. 2. Be accurate and careful when reporting your pain level. This will help with your care. 3. Over-reporting your pain level will lead to loss of credibility. 4. Even a level of 1/10 means that there is pain and will be treated at our facility. 5. High, inaccurate reporting will be documented as "Symptom Exaggeration", leading to loss of credibility and suspicions of possible secondary gains such as obtaining more narcotics, or wanting to appear disabled, for fraudulent reasons. 6. Only pain levels of 5 or below will be seen at our facility. 7. Pain levels of 6 and above will be sent to the Emergency Department and the appointment cancelled. ____________________________________________________________________________________________   ____________________________________________________________________________________________  Medication Rules  Applies to: All patients receiving prescriptions (written or electronic).  Pharmacy of record: Pharmacy where electronic prescriptions will be sent. If written prescriptions are taken to a different pharmacy, please inform the nursing staff. The pharmacy listed in the electronic medical record should be the one where you would like electronic prescriptions to be sent.  Prescription refills: Only during scheduled appointments. Applies to both, written and electronic prescriptions.  NOTE: The following applies primarily to controlled substances (Opioid* Pain Medications).   Patient's responsibilities: 1. Pain Pills: Bring all pain pills to every appointment (except for procedure appointments). 2. Pill Bottles: Bring pills in original pharmacy bottle. Always bring newest bottle. Bring bottle, even if empty. 3. Medication refills: You are responsible for knowing  and keeping track of what medications you need refilled. The day before your appointment, write a list of all  prescriptions that need to be refilled. Bring that list to your appointment and give it to the admitting nurse. Prescriptions will be written only during appointments. If you forget a medication, it will not be "Called in", "Faxed", or "electronically sent". You will need to get another appointment to get these prescribed. 4. Prescription Accuracy: You are responsible for carefully inspecting your prescriptions before leaving our office. Have the discharge nurse carefully go over each prescription with you, before taking them home. Make sure that your name is accurately spelled, that your address is correct. Check the name and dose of your medication to make sure it is accurate. Check the number of pills, and the written instructions to make sure they are clear and accurate. Make sure that you are given enough medication to last until your next medication refill appointment. 5. Taking Medication: Take medication as prescribed. Never take more pills than instructed. Never take medication more frequently than prescribed. Taking less pills or less frequently is permitted and encouraged, when it comes to controlled substances (written prescriptions).  6. Inform other Doctors: Always inform, all of your healthcare providers, of all the medications you take. 7. Pain Medication from other Providers: You are not allowed to accept any additional pain medication from any other Doctor or Healthcare provider. There are two exceptions to this rule. (see below) In the event that you require additional pain medication, you are responsible for notifying us, as stated below. 8. Medication Agreement: You are responsible for carefully reading and following our Medication Agreement. This must be signed before receiving any prescriptions from our practice. Safely store a copy of your signed Agreement. Violations to the Agreement will result in no further prescriptions. (Additional copies of our Medication Agreement are available  upon request.) 9. Laws, Rules, & Regulations: All patients are expected to follow all [address] and [employer], Rules, [employer]. Ignorance of the Laws does not constitute a valid excuse. The use of any illegal substances is prohibited. 10. Adopted CDC guidelines & recommendations: Target dosing levels will be at or below 60 MME/day. Use of benzodiazepines** is not recommended.  Exceptions: There are only two exceptions to the rule of not receiving pain medications from other Healthcare Providers. 1. Exception #1 (Emergencies): In the event of an emergency (i.e.: accident requiring emergency care), you are allowed to receive additional pain medication. However, you are responsible for: As soon as you are able, call our office [phone], at any time of the day or night, and leave a message stating your name, the date and nature of the emergency, and the name and dose of the medication prescribed. In the event that your call is answered by a member of our staff, make sure to document and save the date, time, and the name of the person that took your information.  2. Exception #2 (Planned Surgery): In the event that you are scheduled by another doctor or dentist to have any type of surgery or procedure, you are allowed (for a period no longer than 30 days), to receive additional pain medication, for the acute post-op pain. However, in this case, you are responsible for picking up a copy of our "Post-op Pain Management for Surgeons" handout, and giving it to your surgeon or dentist. This document is available at our office, and does not require an appointment to obtain it. Simply go to our office  during business hours (Monday-Thursday from 8:00 AM to 4:00 PM) (Friday 8:00 AM to 12:00 Noon) or if you have a scheduled appointment with [place], prior to your surgery, and ask for it by name. In addition, you will need to provide [place] with your name, name of your surgeon, type of surgery, and date of  procedure or surgery.  *Opioid medications include: morphine, codeine, oxycodone, oxymorphone, hydrocodone, hydromorphone, meperidine, tramadol, tapentadol, buprenorphine, fentanyl, methadone. **Benzodiazepine medications include: diazepam (Valium), alprazolam (Xanax), clonazepam (Klonopine), lorazepam (Ativan), clorazepate (Tranxene), chlordiazepoxide (Librium), estazolam (Prosom), oxazepam (Serax), temazepam (Restoril), triazolam (Halcion) (Last updated: 09/03/2017) ____________________________________________________________________________________________  ____________________________________________________________________________________________  Medication Recommendations and Reminders  Applies to: All patients receiving prescriptions (written and/or electronic).  Medication Rules & Regulations: These rules and regulations exist for your safety and that of others. They are not flexible and neither are we. Dismissing or ignoring them will be considered "non-compliance" with medication therapy, resulting in complete and irreversible termination of such therapy. (See document titled "Medication Rules" for more details.) In all conscience, because of safety reasons, we cannot continue providing a therapy where the patient does not follow instructions.  Pharmacy of record:   Definition: This is the pharmacy where your electronic prescriptions will be sent.   We do not endorse any particular pharmacy.  You are not restricted in your choice of pharmacy.  The pharmacy listed in the electronic medical record should be the one where you want electronic prescriptions to be sent.  If you choose to change pharmacy, simply notify our nursing staff of your choice of new pharmacy.  Recommendations:  Keep all of your pain medications in a safe place, under lock and key, even if you live alone.   After you fill your prescription, take 1 week's worth of pills and put them away in a safe place. You  should keep a separate, properly labeled bottle for this purpose. The remainder should be kept in the original bottle. Use this as your primary supply, until it runs out. Once it's gone, then you know that you have 1 week's worth of medicine, and it is time to come in for a prescription refill. If you do this correctly, it is unlikely that you will ever run out of medicine.  To make sure that the above recommendation works, it is very important that you make sure your medication refill appointments are scheduled at least 1 week before you run out of medicine. To do this in an effective manner, make sure that you do not leave the office without scheduling your next medication management appointment. Always ask the nursing staff to show you in your prescription , when your medication will be running out. Then arrange for the receptionist to get you a return appointment, at least 7 days before you run out of medicine. Do not wait until you have 1 or 2 pills left, to come in. This is very poor planning and does not take into consideration that we may need to cancel appointments due to bad weather, sickness, or emergencies affecting our staff.  Prescription refills and/or changes in medication(s):   Prescription refills, and/or changes in dose or medication, will be conducted only during scheduled medication management appointments. (Applies to both, written and electronic prescriptions.)  No refills on procedure days. No medication will be changed or started on procedure days. No changes, adjustments, and/or refills will be conducted on a procedure day. Doing so will interfere with the diagnostic portion of the procedure.  No phone refills. No medications  will be "called into the pharmacy".  No Fax refills.  No weekend refills.  No Holliday refills.  No after hours refills.  Remember:  Business hours are:  Monday to Thursday 8:00 AM to 4:00 PM Provider's Schedule: Thad Ranger, NP - Appointments  are:  Medication management: Monday to Thursday 8:00 AM to 4:00 PM Delano Metz, MD - Appointments are:  Medication management: Monday and Wednesday 8:00 AM to 4:00 PM Procedure day: Tuesday and Thursday 7:30 AM to 4:00 PM Edward Jolly, MD - Appointments are:  Medication management: Tuesday and Thursday 8:00 AM to 4:00 PM Procedure day: Monday and Wednesday 7:30 AM to 4:00 PM (Last update: 09/03/2017) ____________________________________________________________________________________________  ____________________________________________________________________________________________  Preparing for Procedure with Sedation  Instructions: . Oral Intake: Do not eat or drink anything for at least 8 hours prior to your procedure. . Transportation: Public transportation is not allowed. Bring an adult driver. The driver must be physically present in our waiting room before any procedure can be started. Marland Kitchen Physical Assistance: Bring an adult physically capable of assisting you, in the event you need help. This adult should keep you company at home for at least 6 hours after the procedure. . Blood Pressure Medicine: Take your blood pressure medicine with a sip of water the morning of the procedure. . Blood thinners:  . Diabetics on insulin: Notify the staff so that you can be scheduled 1st case in the morning. If your diabetes requires high dose insulin, take only  of your normal insulin dose the morning of the procedure and notify the staff that you have done so. . Preventing infections: Shower with an antibacterial soap the morning of your procedure. . Build-up your immune system: Take 1000 mg of Vitamin C with every meal (3 times a day) the day prior to your procedure. Marland Kitchen Antibiotics: Inform the staff if you have a condition or reason that requires you to take antibiotics before dental procedures. . Pregnancy: If you are pregnant, call and cancel the procedure. . Sickness: If you have a  cold, fever, or any active infections, call and cancel the procedure. . Arrival: You must be in the facility at least 30 minutes prior to your scheduled procedure. . Children: Do not bring children with you. . Dress appropriately: Bring dark clothing that you would not mind if they get stained. . Valuables: Do not bring any jewelry or valuables.  Procedure appointments are reserved for interventional treatments only. Marland Kitchen No Prescription Refills. . No medication changes will be discussed during procedure appointments. . No disability issues will be discussed.  Remember:  Regular Business hours are:  Monday to Thursday 8:00 AM to 4:00 PM  Provider's Schedule: Delano Metz, MD:  Procedure days: Tuesday and Thursday 7:30 AM to 4:00 PM  Edward Jolly, MD:  Procedure days: Monday and Wednesday 7:30 AM to 4:00 PM ____________________________________________________________________________________________   Selective Nerve Root Block Patient Information  Description: Specific nerve roots exit the spinal canal and these nerves can be compressed and inflamed by a bulging disc and bone spurs.  By injecting steroids on the nerve root, we can potentially decrease the inflammation surrounding these nerves, which often leads to decreased pain.  Also, by injecting local anesthesia on the nerve root, this can provide [place] helpful information to give to your referring doctor if it decreases your pain.  Selective nerve root blocks can be done along the spine from the neck to the low back depending on the location of your pain.   After numbing  the skin with local anesthesia, a small needle is passed to the nerve root and the position of the needle is verified using x-ray pictures.  After the needle is in correct position, we then deposit the medication.  You may experience a pressure sensation while this is being done.  The entire block usually lasts less than 15 minutes.  Conditions that may be treated  with selective nerve root blocks:  Low back and leg pain  Spinal stenosis  Diagnostic block prior to potential surgery  Neck and arm pain  Post laminectomy syndrome  Preparation for the injection:  3. Do not eat any solid food or dairy products within 8 hours of your appointment. 4. You may drink clear liquids up to 3 hours before an appointment.  Clear liquids include water, black coffee, juice or soda.  No milk or cream please. 5. You may take your regular medications, including pain medications, with a sip of water before your appointment.  Diabetics should hold regular insulin (if taken separately) and take 1/2 normal NPH dose the morning of the procedure.  Carry some sugar containing items with you to your appointment. 6. A driver must accompany you and be prepared to drive you home after your procedure. 7. Bring all your current medications with you. 8. An IV may be inserted and sedation may be given at the discretion of the physician. 9. A blood pressure cuff, EKG, and other monitors will often be applied during the procedure.  Some patients may need to have extra oxygen administered for a short period. 10. You will be asked to provide medical information, including allergies, prior to the procedure.  We must know immediately if you are taking blood  Thinners (like Coumadin) or if you are allergic to IV iodine contrast (dye).  Possible side-effects: All are usually temporary  Bleeding from needle site  Light headedness  Numbness and tingling  Decreased blood pressure  Weakness in arms/legs  Pressure sensation in back/neck  Pain at injection site (several days)  Possible complications: All are extremely rare  Infection  Nerve injury  Spinal headache (a headache wore with upright position)  Call if you experience:  Fever/chills associated with headache or increased back/neck pain  Headache worsened by an upright position  New onset weakness or numbness  of an extremity below the injection site  Hives or difficulty breathing (go to the emergency room)  Inflammation or drainage at the injection site(s)  Severe back/neck pain greater than usual  New symptoms which are concerning to you  Please note:  Although the local anesthetic injected can often make your back or neck feel good for several hours after the injection the pain will likely return.  It takes 3-5 days for steroids to work on the nerve root. You may not notice any pain relief for at least one week.  If effective, we will often do a series of 3 injections spaced 3-6 weeks apart to maximally decrease your pain.    If you have any questions, please call [phone] [facility]

## 2017-09-21 NOTE — Progress Notes (Signed)
Safety precautions to be maintained throughout the outpatient stay will include: orient to surroundings, keep bed in low position, maintain call bell within reach at all times, provide assistance with transfer out of bed and ambulation.  

## 2017-09-21 NOTE — Progress Notes (Signed)
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  Seronegative. b.  Sulfasalazine, no improvement. c.  S/p Methotrexate. d.  S/p Leflunomide. e.  Enbrel, loss of efficacy. f.  Simponi.   Lumbosacral Radiculitis  Bulge of Lumbar Disc Without Myelopathy   seen by [facility] comp   Long Term Prescription Benzodiazepine Use   Pharmacologic Therapy  Problems Influencing Health Status  Disorder of Skeletal System  Other Long Term (Current) Drug Therapy  Long Term Current Use of Opiate Analgesic  Other Specified Health Status  Vitamin D Deficiency  Insomnia, Persistent  Depression, major, single episode, moderate (HCC)  Menopausal Symptom    Plan of Care  Pharmacotherapy (Medications Ordered): Meds ordered this encounter  Medications  . traMADol (ULTRAM) 50 MG tablet    Sig: Take 1 tablet (50 mg total) by mouth every 12 (twelve) hours as needed for severe pain.    Dispense:  60 tablet    Refill:  0    Fill one day early if pharmacy is closed on scheduled refill date. Do not fill until: 09/21/17 To last until: 10/21/17    Procedure Orders     Lumbar Transforaminal Epidural Lab Orders  No laboratory test(s) ordered today   Imaging Orders  No imaging studies ordered today   Referral Orders  No referral(s) requested today    Pharmacological management options:  Opioid Analgesics: We'll take over management today. See above orders Membrane stabilizer: We have discussed the possibility of optimizing this mode of therapy, if tolerated Muscle relaxant: We have discussed the possibility of a trial NSAID: We have discussed the possibility of a trial Other analgesic(s): To be determined at a later time   Interventional management options: Planned, scheduled, and/or pending:    Diagnostic bilateral L5 transforaminal LESI #1 under fluoroscopic guidance and IV sedation   Considering:   Diagnostic bilateral L5 transforaminal LESI  Diagnostic bilateral  lumbar facet block  Possible bilateral lumbar facet RFA  Diagnostic bilateral intra-articular hip joint injection  Diagnostic bilateral femoral nerve block + obturator nerve block  Possible bilateral femoral nerve + obturator nerve RFA  Diagnostic bilateral intra-articular shoulder joint injection  Diagnostic bilateral suprascapular nerve block   Possible bilateral suprascapular nerve RFA  Diagnostic Bilateral intra-articular wrist injections  Diagnostic bilateral intra-articular knee injections  Possible bilateral intra-articular Hyalgan knee injection  Diagnostic bilateral genicular nerve block  Possible bilateral genicular nerve RFA    PRN Procedures:   None at this time   Provider-requested follow-up: Return for Procedure (w/ sedation): (B) L5 TFESI #1.  Future Appointments  Date Time Provider [place]  10/15/2017  8:45 AM Vevelyn Francois, NP [facility] None  11/18/2017  2:40 PM Steele Sizer, MD [facility]    Primary Care Physician: Steele Sizer, MD Location: [facility] Note by: Gaspar Cola, MD Date: 09/21/2017; Time: 2:00 PM

## 2017-09-30 NOTE — Progress Notes (Signed)
Patient's Name: Candice Guzman  MRN: [id number]  Referring Provider: Delano Metz, MD  DOB: 06-Nov-1960  PCP: Alba Cory, MD  DOS: 10/01/2017  Note by: [name], MD  Service setting: Ambulatory outpatient  Specialty: Interventional Pain Management  Patient type: Established  Location: [facility] (AMB) Pain Management Facility  Visit type: Interventional Procedure   Primary Reason for Visit: Interventional Pain Management Treatment. CC: Back Pain (lower)  Procedure:  Anesthesia, Analgesia, Anxiolysis:  Type: Trans-Foraminal Epidural Steroid Injection Purpose: Diagnostic/Therapeutic Region: Posterolateral Lumbosacral Level: L5 Level Laterality: Bilateral Paravertebral Target Area: The 6 o'clock position under the pedicle, on the affected side. Approach: Posterior Percutaneous Paravertebral approach. Position: Prone  Type: Moderate (Conscious) Sedation combined with Local Anesthesia Indication(s): Analgesia and Anxiety Route: Intravenous (IV) IV Access: Secured Sedation: Meaningful verbal contact was maintained at all times during the procedure  Local Anesthetic: Lidocaine 1-2%   Indications: 1. DDD (degenerative disc disease), lumbar   2. Grade 1 Anterolisthesis of L5 over S1 & L3 over L4   3. Lumbar foraminal stenosis (L5-S1) (Bilateral)    Pain Score: Pre-procedure: 3 /10 Post-procedure: 0-No pain/10  Pre-op Assessment:  Candice Guzman is a 57 y.o. (year old), female patient, seen today for interventional treatment. She  has a past surgical history that includes Ganglion cyst excision; Cesarean section; Breast biopsy (Left, 2010); Metacarpophalangeal joint arthroplasty (Right, 06/06/2016); and Finger arthroplasty (Left, 07/24/2017). Candice Guzman has a current medication list which includes the following prescription(s): alprazolam, clonidine, diltiazem, duloxetine, estradiol, humira pen, tramadol, and zolpidem, and the following Facility-Administered Medications: fentanyl  and midazolam. Her primarily concern today is the Back Pain (lower)  Initial Vital Signs:  Pulse Rate: 87 Temp: 98.2 F (36.8 C) Resp: 18 BP: (!) 141/59 SpO2: 100 %  BMI: Estimated body mass index is 22.66 kg/m as calculated from the following:   Height as of this encounter: 5\' 4"  (1.626 m).   Weight as of this encounter: 132 lb (59.9 kg).  Risk Assessment: Allergies: Reviewed. She is allergic to codeine and methotrexate.  Allergy Precautions: None required Coagulopathies: Reviewed. None identified.  Blood-thinner therapy: None at this time Active Infection(s): Reviewed. None identified. Candice Guzman is afebrile  Site Confirmation: Candice Guzman was asked to confirm the procedure and laterality before marking the site Procedure checklist: Completed Consent: Before the procedure and under the influence of no sedative(s), amnesic(s), or anxiolytics, the patient was informed of the treatment options, risks and possible complications. To fulfill our ethical and legal obligations, as recommended by the American Medical Association's Code of Ethics, I have informed the patient of my clinical impression; the nature and purpose of the treatment or procedure; the risks, benefits, and possible complications of the intervention; the alternatives, including doing nothing; the risk(s) and benefit(s) of the alternative treatment(s) or procedure(s); and the risk(s) and benefit(s) of doing nothing. The patient was provided information about the general risks and possible complications associated with the procedure. These may include, but are not limited to: failure to achieve desired goals, infection, bleeding, organ or nerve damage, allergic reactions, paralysis, and death. In addition, the patient was informed of those risks and complications associated to Spine-related procedures, such as failure to decrease pain; infection (i.e.: Meningitis, epidural or intraspinal abscess); bleeding (i.e.: epidural  hematoma, subarachnoid hemorrhage, or any other type of intraspinal or peri-dural bleeding); organ or nerve damage (i.e.: Any type of peripheral nerve, nerve root, or spinal cord injury) with subsequent damage to sensory, motor, and/or autonomic systems, resulting in permanent pain,  numbness, and/or weakness of one or several areas of the body; allergic reactions; (i.e.: anaphylactic reaction); and/or death. Furthermore, the patient was informed of those risks and complications associated with the medications. These include, but are not limited to: allergic reactions (i.e.: anaphylactic or anaphylactoid reaction(s)); adrenal axis suppression; blood sugar elevation that in diabetics may result in ketoacidosis or comma; water retention that in patients with history of congestive heart failure may result in shortness of breath, pulmonary edema, and decompensation with resultant heart failure; weight gain; swelling or edema; medication-induced neural toxicity; particulate matter embolism and blood vessel occlusion with resultant organ, and/or nervous system infarction; and/or aseptic necrosis of one or more joints. Finally, the patient was informed that Medicine is not an exact science; therefore, there is also the possibility of unforeseen or unpredictable risks and/or possible complications that may result in a catastrophic outcome. The patient indicated having understood very clearly. We have given the patient no guarantees and we have made no promises. Enough time was given to the patient to ask questions, all of which were answered to the patient's satisfaction. Candice Guzman has indicated that she wanted to continue with the procedure. Attestation: I, the ordering provider, attest that I have discussed with the patient the benefits, risks, side-effects, alternatives, likelihood of achieving goals, and potential problems during recovery for the procedure that I have provided informed consent. Date  Time:  10/01/2017  8:05 AM  Pre-Procedure Preparation:  Monitoring: As per clinic protocol. Respiration, ETCO2, SpO2, BP, heart rate and rhythm monitor placed and checked for adequate function Safety Precautions: Patient was assessed for positional comfort and pressure points before starting the procedure. Time-out: I initiated and conducted the "Time-out" before starting the procedure, as per protocol. The patient was asked to participate by confirming the accuracy of the "Time Out" information. Verification of the correct person, site, and procedure were performed and confirmed by me, the nursing staff, and the patient. "Time-out" conducted as per Joint Commission's Universal Protocol (UP.01.01.01). Time: 0910  Description of Procedure:       Area Prepped: Entire Posterior Lumbosacral Area Prepping solution: ChloraPrep (2% chlorhexidine gluconate and 70% isopropyl alcohol) Safety Precautions: Aspiration looking for blood return was conducted prior to all injections. At no point did we inject any substances, as a needle was being advanced. No attempts were made at seeking any paresthesias. Safe injection practices and needle disposal techniques used. Medications properly checked for expiration dates. SDV (single dose vial) medications used. Description of the Procedure: Protocol guidelines were followed. The patient was placed in position over the procedure table. The target area was identified and the area prepped in the usual manner. Skin desensitized using vapocoolant spray. Skin & deeper tissues infiltrated with local anesthetic. Appropriate amount of time allowed to pass for local anesthetics to take effect. The procedure needles were then advanced to the target area. Proper needle placement secured. Negative aspiration confirmed. Solution injected in intermittent fashion, asking for systemic symptoms every 0.5cc of injectate. The needles were then removed and the area cleansed, making sure to leave some  of the prepping solution back to take advantage of its long term bactericidal properties. Vitals:   10/01/17 0926 10/01/17 0936 10/01/17 0946 10/01/17 0956  BP: 131/77 (!) 145/82 (!) 148/82 (!) 145/80  Pulse:      Resp: 17 17 16 18   Temp:  97.9 F (36.6 C)    TempSrc:  Temporal    SpO2: 100% 98% 94% 98%  Weight:  Height:        Start Time: 0910 hrs. End Time: 0925 hrs. Materials:  Needle(s) Type: Regular needle Gauge: 22G Length: 5-in Medication(s): Please see orders for medications and dosing details.  Imaging Guidance (Spinal):  Type of Imaging Technique: Fluoroscopy Guidance (Spinal) Indication(s): Assistance in needle guidance and placement for procedures requiring needle placement in or near specific anatomical locations not easily accessible without such assistance. Exposure Time: Please see nurses notes. Contrast: Before injecting any contrast, we confirmed that the patient did not have an allergy to iodine, shellfish, or radiological contrast. Once satisfactory needle placement was completed at the desired level, radiological contrast was injected. Contrast injected under live fluoroscopy. No contrast complications. See chart for type and volume of contrast used. Fluoroscopic Guidance: I was personally present during the use of fluoroscopy. "Tunnel Vision Technique" used to obtain the best possible view of the target area. Parallax error corrected before commencing the procedure. "Direction-depth-direction" technique used to introduce the needle under continuous pulsed fluoroscopy. Once target was reached, antero-posterior, oblique, and lateral fluoroscopic projection used confirm needle placement in all planes. Images permanently stored in EMR. Interpretation: I personally interpreted the imaging intraoperatively. Adequate needle placement confirmed in multiple planes. Appropriate spread of contrast into desired area was observed. No evidence of afferent or efferent  intravascular uptake. No intrathecal or subarachnoid spread observed. Permanent images saved into the patient's record.  Antibiotic Prophylaxis:   Anti-infectives (From admission, onward)   None     Indication(s): None identified  Post-operative Assessment:  Post-procedure Vital Signs:  Pulse Rate: 87 Temp: 97.9 F (36.6 C) Resp: 18 BP: (!) 145/80 SpO2: 98 %  EBL: None  Complications: No immediate post-treatment complications observed by team, or reported by patient.  Note: The patient tolerated the entire procedure well. A repeat set of vitals were taken after the procedure and the patient was kept under observation following institutional policy, for this type of procedure. Post-procedural neurological assessment was performed, showing return to baseline, prior to discharge. The patient was provided with post-procedure discharge instructions, including a section on how to identify potential problems. Should any problems arise concerning this procedure, the patient was given instructions to immediately contact us, at any time, without hesitation. In any case, we plan to contact the patient by telephone for a follow-up status report regarding this interventional procedure.  Comments:  No additional relevant information.  Plan of Care    Imaging Orders     DG C-Arm 1-60 Min-No Report  Procedure Orders     Lumbar Transforaminal Epidural  Medications ordered for procedure: Meds ordered this encounter  Medications  . iopamidol (ISOVUE-M) 41 % intrathecal injection 10 mL    Must be Myelogram-compatible. If not available, you may substitute with a water-soluble, non-ionic, hypoallergenic, myelogram-compatible radiological contrast medium.  Marland Kitchen lidocaine (XYLOCAINE) 2 % (with pres) injection 400 mg  . midazolam (VERSED) 5 MG/5ML injection 1-2 mg    Make sure Flumazenil is available in the pyxis when using this medication. If oversedation occurs, administer 0.2 mg IV over 15 sec. If  after 45 sec no response, administer 0.2 mg again over 1 min; may repeat at 1 min intervals; not to exceed 4 doses (1 mg)  . fentaNYL (SUBLIMAZE) injection 25-50 mcg    Make sure Narcan is available in the pyxis when using this medication. In the event of respiratory depression (RR< 8/min): Titrate NARCAN (naloxone) in increments of 0.1 to 0.2 mg IV at 2-3 minute intervals, until desired degree of  reversal.  . lactated ringers infusion 1,000 mL  . sodium chloride flush (NS) 0.9 % injection 1 mL  . ropivacaine (PF) 2 mg/mL (0.2%) (NAROPIN) injection 1 mL  . dexamethasone (DECADRON) injection 10 mg  . sodium chloride flush (NS) 0.9 % injection 1 mL  . ropivacaine (PF) 2 mg/mL (0.2%) (NAROPIN) injection 1 mL  . dexamethasone (DECADRON) injection 10 mg   Medications administered: We administered iopamidol, lidocaine, midazolam, fentaNYL, lactated ringers, sodium chloride flush, ropivacaine (PF) 2 mg/mL (0.2%), dexamethasone, sodium chloride flush, ropivacaine (PF) 2 mg/mL (0.2%), and dexamethasone.  See the medical record for exact dosing, route, and time of administration.  New Prescriptions   No medications on file   Disposition: Discharge home  Discharge Date & Time: 10/01/2017; 1000 hrs.   Physician-requested Follow-up: Return for post-procedure eval (2 wks), w/ Dr. Laban Emperor.  Future Appointments  Date Time Provider Department Center  10/15/2017  8:45 AM Barbette Merino, NP [facility] None  10/26/2017  9:15 AM Delano Metz, MD [facility] None  11/18/2017  2:40 PM Alba Cory, MD [facility]   Primary Care Physician: Alba Cory, MD Location: [facility] Note by: [name], MD Date: 10/01/2017; Time: 12:33 PM  Disclaimer:  Medicine is not an Visual merchandiser. The only guarantee in medicine is that nothing is guaranteed. It is important to note that the decision to proceed with this intervention was based on the information collected from  the patient. The Data and conclusions were drawn from the patient's questionnaire, the interview, and the physical examination. Because the information was provided in large part by the patient, it cannot be guaranteed that it has not been purposely or unconsciously manipulated. Every effort has been made to obtain as much relevant data as possible for this evaluation. It is important to note that the conclusions that lead to this procedure are derived in large part from the available data. Always take into account that the treatment will also be dependent on availability of resources and existing treatment guidelines, considered by other Pain Management Practitioners as being common knowledge and practice, at the time of the intervention. For Medico-Legal purposes, it is also important to point out that variation in procedural techniques and pharmacological choices are the acceptable norm. The indications, contraindications, technique, and results of the above procedure should only be interpreted and judged by a Board-Certified Interventional Pain Specialist with extensive familiarity and expertise in the same exact procedure and technique.

## 2017-10-01 ENCOUNTER — Ambulatory Visit (HOSPITAL_BASED_OUTPATIENT_CLINIC_OR_DEPARTMENT_OTHER): Payer: Managed Care, Other (non HMO) | Admitting: Pain Medicine

## 2017-10-01 ENCOUNTER — Encounter: Payer: Self-pay | Admitting: Pain Medicine

## 2017-10-01 ENCOUNTER — Ambulatory Visit: Payer: Managed Care, Other (non HMO) | Admitting: Pain Medicine

## 2017-10-01 ENCOUNTER — Other Ambulatory Visit: Payer: Self-pay

## 2017-10-01 ENCOUNTER — Ambulatory Visit
Admission: RE | Admit: 2017-10-01 | Discharge: 2017-10-01 | Disposition: A | Discharge status: Home / Self Care | Payer: Managed Care, Other (non HMO) | Source: Ambulatory Visit | Attending: Pain Medicine | Admitting: Pain Medicine

## 2017-10-01 VITALS — BP 145/80 | HR 87 | Temp 97.9°F | Resp 18 | Ht 64.0 in | Wt 132.0 lb

## 2017-10-01 DIAGNOSIS — M48061 Spinal stenosis, lumbar region without neurogenic claudication: Secondary | ICD-10-CM

## 2017-10-01 DIAGNOSIS — M5136 Other intervertebral disc degeneration, lumbar region: Secondary | ICD-10-CM

## 2017-10-01 DIAGNOSIS — M79605 Pain in left leg: Secondary | ICD-10-CM

## 2017-10-01 DIAGNOSIS — M4310 Spondylolisthesis, site unspecified: Secondary | ICD-10-CM

## 2017-10-01 DIAGNOSIS — G8929 Other chronic pain: Secondary | ICD-10-CM

## 2017-10-01 DIAGNOSIS — M79604 Pain in right leg: Secondary | ICD-10-CM

## 2017-10-01 MED ORDER — DEXAMETHASONE SODIUM PHOSPHATE 10 MG/ML IJ SOLN
10.0000 mg | Freq: Once | INTRAMUSCULAR | Status: AC
  Administered 2017-10-01: 10 mg
  Filled 2017-10-01: qty 1

## 2017-10-01 MED ORDER — LIDOCAINE HCL 2 % IJ SOLN
20.0000 mL | Freq: Once | INTRAMUSCULAR | Status: AC
  Administered 2017-10-01: 400 mg
  Filled 2017-10-01: qty 20

## 2017-10-01 MED ORDER — FENTANYL CITRATE (PF) 100 MCG/2ML IJ SOLN
25.0000 ug | INTRAMUSCULAR | Status: DC | PRN
  Administered 2017-10-01: 50 ug via INTRAVENOUS
  Filled 2017-10-01: qty 2

## 2017-10-01 MED ORDER — ROPIVACAINE HCL 2 MG/ML IJ SOLN
1.0000 mL | Freq: Once | INTRAMUSCULAR | Status: AC
  Administered 2017-10-01: 10 mL via EPIDURAL
  Filled 2017-10-01: qty 10

## 2017-10-01 MED ORDER — SODIUM CHLORIDE 0.9 % IJ SOLN
INTRAMUSCULAR | Status: AC
  Filled 2017-10-01: qty 10

## 2017-10-01 MED ORDER — SODIUM CHLORIDE 0.9% FLUSH
1.0000 mL | Freq: Once | INTRAVENOUS | Status: AC
  Administered 2017-10-01: 10 mL

## 2017-10-01 MED ORDER — MIDAZOLAM HCL 5 MG/5ML IJ SOLN
1.0000 mg | INTRAMUSCULAR | Status: DC | PRN
  Administered 2017-10-01: 3 mg via INTRAVENOUS
  Filled 2017-10-01: qty 5

## 2017-10-01 MED ORDER — IOPAMIDOL (ISOVUE-M 200) INJECTION 41%
10.0000 mL | Freq: Once | INTRAMUSCULAR | Status: AC
  Administered 2017-10-01: 10 mL via EPIDURAL
  Filled 2017-10-01: qty 10

## 2017-10-01 MED ORDER — LACTATED RINGERS IV SOLN
1000.0000 mL | Freq: Once | INTRAVENOUS | Status: AC
  Administered 2017-10-01: 1000 mL via INTRAVENOUS

## 2017-10-01 NOTE — Patient Instructions (Signed)

## 2017-10-02 ENCOUNTER — Telehealth: Payer: Self-pay

## 2017-10-02 NOTE — Telephone Encounter (Signed)
Post procedure phone call.  Patient states she is doing well.  

## 2017-10-15 ENCOUNTER — Ambulatory Visit: Payer: Managed Care, Other (non HMO) | Attending: Nurse Practitioner | Admitting: Nurse Practitioner

## 2017-10-15 ENCOUNTER — Encounter: Payer: Self-pay | Admitting: Nurse Practitioner

## 2017-10-15 ENCOUNTER — Other Ambulatory Visit: Payer: Self-pay

## 2017-10-15 VITALS — BP 148/90 | HR 78 | Temp 98.3°F | Resp 16 | Ht 64.0 in | Wt 130.0 lb

## 2017-10-15 DIAGNOSIS — F329 Major depressive disorder, single episode, unspecified: Secondary | ICD-10-CM | POA: Diagnosis not present

## 2017-10-15 DIAGNOSIS — G4700 Insomnia, unspecified: Secondary | ICD-10-CM | POA: Insufficient documentation

## 2017-10-15 DIAGNOSIS — Z825 Family history of asthma and other chronic lower respiratory diseases: Secondary | ICD-10-CM | POA: Insufficient documentation

## 2017-10-15 DIAGNOSIS — M25511 Pain in right shoulder: Secondary | ICD-10-CM | POA: Diagnosis not present

## 2017-10-15 DIAGNOSIS — M5442 Lumbago with sciatica, left side: Secondary | ICD-10-CM

## 2017-10-15 DIAGNOSIS — M25531 Pain in right wrist: Secondary | ICD-10-CM | POA: Diagnosis not present

## 2017-10-15 DIAGNOSIS — G894 Chronic pain syndrome: Secondary | ICD-10-CM | POA: Insufficient documentation

## 2017-10-15 DIAGNOSIS — E559 Vitamin D deficiency, unspecified: Secondary | ICD-10-CM | POA: Insufficient documentation

## 2017-10-15 DIAGNOSIS — M069 Rheumatoid arthritis, unspecified: Secondary | ICD-10-CM | POA: Diagnosis not present

## 2017-10-15 DIAGNOSIS — Z9889 Other specified postprocedural states: Secondary | ICD-10-CM | POA: Diagnosis not present

## 2017-10-15 DIAGNOSIS — M48061 Spinal stenosis, lumbar region without neurogenic claudication: Secondary | ICD-10-CM | POA: Diagnosis not present

## 2017-10-15 DIAGNOSIS — M7918 Myalgia, other site: Secondary | ICD-10-CM | POA: Insufficient documentation

## 2017-10-15 DIAGNOSIS — M79604 Pain in right leg: Secondary | ICD-10-CM | POA: Diagnosis not present

## 2017-10-15 DIAGNOSIS — M79605 Pain in left leg: Secondary | ICD-10-CM

## 2017-10-15 DIAGNOSIS — Z809 Family history of malignant neoplasm, unspecified: Secondary | ICD-10-CM | POA: Insufficient documentation

## 2017-10-15 DIAGNOSIS — M25512 Pain in left shoulder: Secondary | ICD-10-CM | POA: Insufficient documentation

## 2017-10-15 DIAGNOSIS — Z79899 Other long term (current) drug therapy: Secondary | ICD-10-CM | POA: Diagnosis not present

## 2017-10-15 DIAGNOSIS — M5441 Lumbago with sciatica, right side: Secondary | ICD-10-CM

## 2017-10-15 DIAGNOSIS — M5126 Other intervertebral disc displacement, lumbar region: Secondary | ICD-10-CM | POA: Diagnosis not present

## 2017-10-15 DIAGNOSIS — M5136 Other intervertebral disc degeneration, lumbar region: Secondary | ICD-10-CM | POA: Insufficient documentation

## 2017-10-15 DIAGNOSIS — M25532 Pain in left wrist: Secondary | ICD-10-CM | POA: Diagnosis not present

## 2017-10-15 DIAGNOSIS — G8929 Other chronic pain: Secondary | ICD-10-CM

## 2017-10-15 DIAGNOSIS — Z885 Allergy status to narcotic agent status: Secondary | ICD-10-CM | POA: Insufficient documentation

## 2017-10-15 MED ORDER — TRAMADOL HCL 50 MG PO TABS: 50.0000 mg | ORAL_TABLET | Freq: Two times a day (BID) | ORAL | 1 refills | Status: DC | PRN

## 2017-10-15 NOTE — Progress Notes (Signed)
Patient's Name: Candice Guzman  MRN: [id number]  Referring Provider: Steele Sizer, MD  DOB: 01-01-1961  PCP: Steele Sizer, MD  DOS: 10/15/2017  Note by: Vevelyn Francois NP  Service setting: Ambulatory outpatient  Specialty: Interventional Pain Management  Location: [facility] (AMB) Pain Management Facility    Patient type: Established    Primary Reason(s) for Visit: Encounter for prescription drug management & post-procedure evaluation of chronic illness with mild to moderate exacerbation(Level of risk: moderate) CC: Back Pain (lower) and Shoulder Pain (bilateral)  HPI  Candice Guzman is a 57 y.o. year old, female patient, who comes today for a post-procedure evaluation and medication management. She has Insomnia, persistent; Bulge of lumbar disc without myelopathy; Depression, major, single episode, moderate ([place]); Lumbosacral radiculitis; Menopausal symptom; Vitamin D deficiency; Inflammatory arthritis ([place]); Chronic pain of multiple joints; Chronic pain syndrome; Chronic knee pain (Tertiary Area of Pain) (Bilateral); Chronic wrist pain (Fourth Area of Pain) (Bilateral); Disorder of skeletal system; Other long term (current) drug therapy; Long term current use of opiate analgesic; Other specified health status; Long term prescription benzodiazepine use; Chronic lower extremity pain (Secondary Area of Pain) (Bilateral) (R>L); Chronic low back pain (Primary Area of Pain) (Bilateral) (R>L); Pharmacologic therapy; Problems influencing health status; Spondylosis without myelopathy or radiculopathy, lumbosacral region; Chronic hip pain (Bilateral) (R>L); Chronic shoulder pain (Bilateral) (L>R); Grade 1 Anterolisthesis of L5 over S1 & L3 over L4; Grade 1 Retrolisthesis L1 over L2 & L2 over L3; DDD (degenerative disc disease), lumbar; 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 Her primarily concern today is the Back Pain (lower) and Shoulder Pain (bilateral)  Pain Assessment: Location: Lower Back Radiating: both feet Onset: More than a month ago Duration: Chronic pain Quality: Aching Severity: 3 /10 (self-reported pain score)  Note: Reported level is compatible with observation.                          Effect on ADL:   Timing: Constant Modifying factors: nothing  Candice Guzman was last seen on 07/08/2017 for a procedure. During today's appointment we reviewed Candice Guzman's post-procedure results, as well as her outpatient medication regimen. She states that it has helped her leg and feet a lot. She states that she has not had any leg pain. She had not had any relief with her back.   Further details on both, my assessment(s), as well as the proposed treatment plan, please see below.  Controlled Substance Pharmacotherapy Assessment REMS (Risk Evaluation and Mitigation Strategy)  Analgesic: Tramadol 50 mg 1 tab PO BID (100 mg/day) MME/day:10 mg/day   Landis Martins, RN  10/15/2017  9:04 AM  Sign at close encounter Nursing Pain Medication Assessment:  Safety precautions to be maintained throughout the outpatient stay will include: orient to surroundings, keep bed in low position, maintain call bell within reach at all times, provide assistance with transfer out of bed and ambulation.  Medication Inspection Compliance: Pill count conducted under aseptic conditions, in front of the patient. Neither the pills nor the bottle was removed from the patient's sight at any time. Once count was completed pills were immediately returned to the patient in their original bottle.  Medication: Tramadol (Ultram) Pill/Patch Count: 36 of 60 pills remain Pill/Patch  Appearance: Markings consistent with prescribed  medication Bottle Appearance: Standard pharmacy container. Clearly labeled. Filled Date: 03/18 / 2019 Last Medication intake:  Today   Pharmacokinetics: Liberation and absorption (onset of action): WNL Distribution (time to peak effect): WNL Metabolism and excretion (duration of action): WNL         Pharmacodynamics: Desired effects: Analgesia: Candice Guzman reports >50% benefit. Functional ability: Patient reports that medication allows her to accomplish basic ADLs Clinically meaningful improvement in function (CMIF): Sustained CMIF goals met Perceived effectiveness: Described as relatively effective, allowing for increase in activities of daily living (ADL) Undesirable effects: Side-effects or Adverse reactions: None reported Monitoring: [place] PMP: Online review of the past 68-monthperiod conducted. Compliant with practice rules and regulations Last UDS on record: Summary  Date Value Ref Range Status  07/08/2017 FINAL  Final    Comment:    ==================================================================== [facility] COMP DRUG ANALYSIS,UR ==================================================================== Test                             Result       Flag       Units Drug Present and Declared for Prescription Verification   Alpha-hydroxyalprazolam        55           EXPECTED   ng/mg creat    Alpha-hydroxyalprazolam is an expected metabolite of alprazolam.    Source of alprazolam is a scheduled prescription medication.   Tramadol                       3138         EXPECTED   ng/mg creat   O-Desmethyltramadol            3363         EXPECTED   ng/mg creat   N-Desmethyltramadol            2280         EXPECTED   ng/mg creat    Source of tramadol is a prescription medication.    O-desmethyltramadol and N-desmethyltramadol are expected    metabolites of tramadol.   Zolpidem Acid                  PRESENT      EXPECTED    Zolpidem acid is an expected metabolite of zolpidem.    Duloxetine                     PRESENT      EXPECTED Drug Present not Declared for Prescription Verification   Doxylamine                     PRESENT      UNEXPECTED Drug Absent but Declared for Prescription Verification   Tizanidine                     Not Detected UNEXPECTED    Tizanidine, as indicated in the declared medication list, is not    always detected even when used as directed.   Clonidine                      Not Detected UNEXPECTED ==================================================================== Test                      Result    Flag   Units      Ref Range  Creatinine              40               mg/dL      >=20 ==================================================================== Declared Medications:  The flagging and interpretation on this report are based on the  following declared medications.  Unexpected results may arise from  inaccuracies in the declared medications.  **Note: The testing scope of this panel includes these medications:  Alprazolam (Xanax)  Clonidine (Catapres)  Duloxetine (Cymbalta)  Tramadol (Ultram)  **Note: The testing scope of this panel does not include small to  moderate amounts of these reported medications:  Tizanidine (Zanaflex)  Zolpidem (Ambien)  **Note: The testing scope of this panel does not include following  reported medications:  Adalimumab (Humira)  Meloxicam (Mobic)  Methotrexate ==================================================================== For clinical consultation, please call [phone]. ====================================================================    UDS interpretation: Compliant          Medication Assessment Form: Reviewed. Patient indicates being compliant with therapy Treatment compliance: Compliant Risk Assessment Profile: Aberrant behavior: See prior evaluations. None observed or detected today Comorbid factors increasing risk of overdose: See prior notes. No additional risks detected  today Risk of substance use disorder (SUD): Low Opioid Risk Tool - 09/21/17 1154      Family History of Substance Abuse   Alcohol  Negative    Illegal Drugs  Negative    Rx Drugs  Negative      Personal History of Substance Abuse   Alcohol  Negative    Illegal Drugs  Negative    Rx Drugs  Negative      Age   Age between 6-45 years   No      Psychological Disease   Psychological Disease  Negative    Depression  Negative      Total Score   Opioid Risk Tool Scoring  0    Opioid Risk Interpretation  Low Risk      ORT Scoring interpretation table:  Score <3 = Low Risk for SUD  Score between 4-7 = Moderate Risk for SUD  Score >8 = High Risk for Opioid Abuse   Risk Mitigation Strategies:  Patient Counseling: Covered Patient-Prescriber Agreement (PPA): Present and active  Notification to other healthcare providers: Done  Pharmacologic Plan: No change in therapy, at this time.             Post-Procedure Assessment  10/01/2017 Procedure: transforaminal lumbar epidural steroid injection Pre-procedure pain score:        /10 Post-procedure pain score: 0/10         Influential Factors: BMI: 22.31 kg/m Intra-procedural challenges: None observed.         Assessment challenges: None detected.              Reported side-effects: None.        Post-procedural adverse reactions or complications: None reported         Sedation: Please see nurses note. When no sedatives are used, the analgesic levels obtained are directly associated to the effectiveness of the local anesthetics. However, when sedation is provided, the level of analgesia obtained during the initial 1 hour following the intervention, is believed to be the result of a combination of factors. These factors may include, but are not limited to: 1. The effectiveness of the local anesthetics used. 2. The effects of the analgesic(s) and/or anxiolytic(s) used. 3. The degree of discomfort experienced by the patient at the time of  the procedure. 4.  The patients ability and reliability in recalling and recording the events. 5. The presence and influence of possible secondary gains and/or psychosocial factors. Reported result: Relief experienced during the 1st hour after the procedure: 50 % (Ultra-Short Term Relief)            Interpretative annotation: Clinically appropriate result. Analgesia during this period is likely to be Local Anesthetic and/or IV Sedative (Analgesic/Anxiolytic) related.          Effects of local anesthetic: The analgesic effects attained during this period are directly associated to the localized infiltration of local anesthetics and therefore cary significant diagnostic value as to the etiological location, or anatomical origin, of the pain. Expected duration of relief is directly dependent on the pharmacodynamics of the local anesthetic used. Long-acting (4-6 hours) anesthetics used.  Reported result: Relief during the next 4 to 6 hour after the procedure: 50 % (Short-Term Relief)            Interpretative annotation: Clinically appropriate result. Analgesia during this period is likely to be Local Anesthetic-related.          Long-term benefit: Defined as the period of time past the expected duration of local anesthetics (1 hour for short-acting and 4-6 hours for long-acting). With the possible exception of prolonged sympathetic blockade from the local anesthetics, benefits during this period are typically attributed to, or associated with, other factors such as analgesic sensory neuropraxia, antiinflammatory effects, or beneficial biochemical changes provided by agents other than the local anesthetics.  Reported result: Extended relief following procedure: 10 % (Long-Term Relief)            Interpretative annotation: Clinically appropriate result. Good relief. No permanent benefit expected. Inflammation plays a part in the etiology to the pain.          Current benefits: Defined as reported results  that persistent at this point in time.   Analgesia: 50 %            Function: Ms. Mysliwiec reports improvement in function ROM: Ms. Temples reports improvement in ROM Interpretative annotation: Ongoing benefit.    Effective diagnostic intervention.          Interpretation: Results would suggest a successful diagnostic intervention.                  Plan:  Please see "Plan of Care" for details.                Laboratory Chemistry  Inflammation Markers (CRP: Acute Phase) (ESR: Chronic Phase) Lab Results  Component Value Date   CRP 2.2 07/08/2017   ESRSEDRATE CANCELED 07/08/2017                         Rheumatology Markers Lab Results  Component Value Date   LABURIC 2.5 07/08/2017                        Renal Function Markers Lab Results  Component Value Date   BUN 11 07/08/2017   CREATININE 0.66 07/08/2017   GFRAA 114 07/08/2017   GFRNONAA 99 07/08/2017                              Hepatic Function Markers Lab Results  Component Value Date   AST 17 07/08/2017   ALBUMIN 4.4 07/08/2017   ALKPHOS 133 (H) 07/08/2017  Electrolytes Lab Results  Component Value Date   NA 140 07/08/2017   K 4.2 07/08/2017   CL 103 07/08/2017   CALCIUM 9.0 07/08/2017   MG 2.1 07/08/2017                        Neuropathy Markers Lab Results  Component Value Date   VITAMINB12 310 07/08/2017   HGBA1C 5.10 02/11/2016                        Bone Pathology Markers Lab Results  Component Value Date   25OHVITD1 24 (L) 07/08/2017   25OHVITD2 <1.0 07/08/2017   25OHVITD3 24 07/08/2017                         Coagulation Parameters No results found for: INR, LABPROT, APTT, PLT, DDIMER                      Cardiovascular Markers No results found for: BNP, CKTOTAL, CKMB, TROPONINI, HGB, HCT                       CA Markers No results found for: CEA, CA125, LABCA2                      Note: Lab results reviewed.  Recent Diagnostic Imaging Results  DG C-Arm 1-60  Min-No Report Fluoroscopy was utilized by the requesting physician.  No radiographic  interpretation.   Complexity Note: Imaging results reviewed. Results shared with Ms. Simenson, using [employer].                         Meds   Current Outpatient Medications:  .  ALPRAZolam (XANAX) 0.5 MG tablet, Take 1 tablet (0.5 mg total) at bedtime as needed by mouth., Disp: 30 tablet, Rfl: 0 .  cloNIDine (CATAPRES) 0.1 MG tablet, Take 1 tablet (0.1 mg total) by mouth 2 (two) times daily. Take 1 tablet by mouth two  times daily, Disp: 180 tablet, Rfl: 1 .  diltiazem (CARDIZEM) 30 MG tablet, Take 1 tablet (30 mg total) by mouth 4 (four) times daily., Disp: 120 tablet, Rfl: 0 .  DULoxetine (CYMBALTA) 60 MG capsule, Take 2 capsules (120 mg total) by mouth daily., Disp: 180 capsule, Rfl: 1 .  estradiol (ESTRACE) 0.1 MG/GM vaginal cream, Place 1 Applicatorful vaginally at bedtime. Insert 1g nightly for 1 wk, then 1 g once weekly as maintenace, Disp: 42.5 g, Rfl: 1 .  HUMIRA PEN 40 MG/0.8ML PNKT, INJECT Subcutaneously EVERY other WEEK, Disp: , Rfl: 3 .  [START ON 10/21/2017] traMADol (ULTRAM) 50 MG tablet, Take 1 tablet (50 mg total) by mouth every 12 (twelve) hours as needed for severe pain., Disp: 60 tablet, Rfl: 1 .  zolpidem (AMBIEN CR) 12.5 MG CR tablet, Take 1 tablet (12.5 mg total) by mouth at bedtime., Disp: 90 tablet, Rfl: 0  ROS  Constitutional: Denies any fever or chills Gastrointestinal: No reported hemesis, hematochezia, vomiting, or acute GI distress Musculoskeletal: Denies any acute onset joint swelling, redness, loss of ROM, or weakness Neurological: No reported episodes of acute onset apraxia, aphasia, dysarthria, agnosia, amnesia, paralysis, loss of coordination, or loss of consciousness  Allergies  Ms. Rady is allergic to codeine and methotrexate.  PFSH  Drug: Ms. Mcclory  reports that she does not use drugs. Alcohol:  reports that she does not drink alcohol. Tobacco:  reports that  she has never smoked. She has never used smokeless tobacco. Medical:  has a past medical history of Depression, Insomnia, Lumbar herniated disc, Menopause, Rheumatoid arthritis ([place]), and Vitamin D deficiency. Surgical: Ms. Bachmann  has a past surgical history that includes Ganglion cyst excision; Cesarean section; Breast biopsy (Left, 2010); Metacarpophalangeal joint arthroplasty (Right, 06/06/2016); and Finger arthroplasty (Left, 07/24/2017). Family: family history includes Cancer in her brother; Cancer (age of onset: 73) in her father; Emphysema in her mother.  Constitutional Exam  General appearance: Well nourished, well developed, and well hydrated. In no apparent acute distress Vitals:   10/15/17 0858  BP: (!) 148/90  Pulse: 78  Resp: 16  Temp: 98.3 F (36.8 C)  TempSrc: Oral  SpO2: 100%  Weight: 130 lb (59 kg)  Height: _0  (1.626 m)  Psych/Mental status: Alert, oriented x 3 (person, place, & time)       Eyes: PERLA Respiratory: No evidence of acute respiratory distress   Lumbar Spine Area Exam  Skin & Axial Inspection: No masses, redness, or swelling Alignment: Symmetrical Functional ROM: Unrestricted ROM      Stability: No instability detected Muscle Tone/Strength: Functionally intact. No obvious neuro-muscular anomalies detected. Sensory (Neurological): Unimpaired Palpation: Tender       Provocative Tests: Lumbar Hyperextension and rotation test: evaluation deferred today       Lumbar Lateral bending test: evaluation deferred today       Patrick's Maneuver: evaluation deferred today                    Gait & Posture Assessment  Ambulation: Unassisted Gait: Relatively normal for age and body habitus Posture: WNL   Lower Extremity Exam    Side: Right lower extremity  Side: Left lower extremity  Skin & Extremity Inspection: Skin color, temperature, and hair growth are WNL. No peripheral edema or cyanosis. No masses, redness, swelling, asymmetry, or associated skin  lesions. No contractures.  Skin & Extremity Inspection: Skin color, temperature, and hair growth are WNL. No peripheral edema or cyanosis. No masses, redness, swelling, asymmetry, or associated skin lesions. No contractures.  Functional ROM: Unrestricted ROM          Functional ROM: Unrestricted ROM          Muscle Tone/Strength: Functionally intact. No obvious neuro-muscular anomalies detected.  Muscle Tone/Strength: Functionally intact. No obvious neuro-muscular anomalies detected.  Sensory (Neurological): Unimpaired  Sensory (Neurological): Unimpaired  Palpation: No palpable anomalies  Palpation: No palpable anomalies   Assessment  Primary Diagnosis & Pertinent Problem List: The primary encounter diagnosis was DDD (degenerative disc disease), lumbar. Diagnoses of Chronic lower extremity pain (Secondary Area of Pain) (Bilateral) (R>L), Chronic low back pain (Primary Area of Pain) (Bilateral) (R>L), and Chronic pain syndrome were also pertinent to this visit.  Status Diagnosis  Persistent Controlled Persistent 1. DDD (degenerative disc disease), lumbar   2. Chronic lower extremity pain (Secondary Area of Pain) (Bilateral) (R>L)   3. Chronic low back pain (Primary Area of Pain) (Bilateral) (R>L)   4. Chronic pain syndrome     Problems updated and reviewed during this visit: Problem  Chronic wrist pain (Fourth Area of Pain) (Bilateral)   Plan of Care  Pharmacotherapy (Medications Ordered): Meds ordered this encounter  Medications  . traMADol (ULTRAM) 50 MG tablet    Sig: Take 1 tablet (50 mg total) by mouth every 12 (twelve) hours as needed for severe pain.  Dispense:  60 tablet    Refill:  1    Fill one day early if pharmacy is closed on scheduled refill date. Do not fill until: 10/21/2017 To last until: 11/20/2017    Order Specific Question:   Supervising Provider    Answer:   Milinda Pointer [phone]   New Prescriptions   No medications on file   Medications administered  today: Cynia Abruzzo. Kellison had no medications administered during this visit. Lab-work, procedure(s), and/or referral(s): No orders of the defined types were placed in this encounter.  Imaging and/or referral(s): None  Interventional management options: Planned, scheduled, and/or pending:    Will keep follow up with Dr Delane Ginger. So they can proceed on with the plan of treatment.    Considering:   Diagnostic bilateral L5 transforaminal LESI  Diagnostic bilateral lumbar facet block  Possible bilateral lumbar facet RFA  Diagnostic bilateral intra-articular hip joint injection  Diagnostic bilateral femoral nerve block + obturator nerve block  Possible bilateral femoral nerve + obturator nerve RFA  Diagnostic bilateral intra-articular shoulder joint injection  Diagnostic bilateral suprascapular nerve block  Possible bilateral suprascapular nerve RFA  Diagnostic Bilateral intra-articular wrist injections  Diagnosticbilateral intra-articular knee injections  Possible bilateral intra-articular Hyalgan knee injection  Diagnostic bilateral genicular nerve block  Possible bilateral genicular nerve RFA    PRN Procedures:   None at this time      Provider-requested follow-up: Return in about 6 weeks (around 11/26/2017) for MedMgmt with Me Dionisio David).  Future Appointments  Date Time Provider Placer  10/26/2017  9:15 AM Milinda Pointer, MD [facility] None  11/18/2017  2:40 PM Steele Sizer, MD [facility]  11/26/2017 12:45 PM Vevelyn Francois, NP [facility] None   Primary Care Physician: Steele Sizer, MD Location: [facility] Outpatient Pain Management Facility Note by: Vevelyn Francois NP Date: 10/15/2017; Time: 11:27 AM  Pain Score Disclaimer: We use the NRS-11 scale. This is a self-reported, subjective measurement of pain severity with only modest accuracy. It is used primarily to identify changes within a particular patient. It must be understood that outpatient pain scales  are significantly less accurate that those used for research, where they can be applied under ideal controlled circumstances with minimal exposure to variables. In reality, the score is likely to be a combination of pain intensity and pain affect, where pain affect describes the degree of emotional arousal or changes in action readiness caused by the sensory experience of pain. Factors such as social and work situation, setting, emotional state, anxiety levels, expectation, and prior pain experience may influence pain perception and show large inter-individual differences that may also be affected by time variables.  Patient instructions provided during this appointment: Patient Instructions  ____________________________________________________________________________________________  Medication Rules  Applies to: All patients receiving prescriptions (written or electronic).  Pharmacy of record: Pharmacy where electronic prescriptions will be sent. If written prescriptions are taken to a different pharmacy, please inform the nursing staff. The pharmacy listed in the electronic medical record should be the one where you would like electronic prescriptions to be sent.  Prescription refills: Only during scheduled appointments. Applies to both, written and electronic prescriptions.  NOTE: The following applies primarily to controlled substances (Opioid* Pain Medications).   Patient's responsibilities: 1. Pain Pills: Bring all pain pills to every appointment (except for procedure appointments). 2. Pill Bottles: Bring pills in original pharmacy bottle. Always bring newest bottle. Bring bottle, even if empty. 3. Medication refills: You are responsible for knowing and keeping track of  what medications you need refilled. The day before your appointment, write a list of all prescriptions that need to be refilled. Bring that list to your appointment and give it to the admitting nurse. Prescriptions will be  written only during appointments. If you forget a medication, it will not be "Called in", "Faxed", or "electronically sent". You will need to get another appointment to get these prescribed. 4. Prescription Accuracy: You are responsible for carefully inspecting your prescriptions before leaving our office. Have the discharge nurse carefully go over each prescription with you, before taking them home. Make sure that your name is accurately spelled, that your address is correct. Check the name and dose of your medication to make sure it is accurate. Check the number of pills, and the written instructions to make sure they are clear and accurate. Make sure that you are given enough medication to last until your next medication refill appointment. 5. Taking Medication: Take medication as prescribed. Never take more pills than instructed. Never take medication more frequently than prescribed. Taking less pills or less frequently is permitted and encouraged, when it comes to controlled substances (written prescriptions).  6. Inform other Doctors: Always inform, all of your healthcare providers, of all the medications you take. 7. Pain Medication from other Providers: You are not allowed to accept any additional pain medication from any other Doctor or Healthcare provider. There are two exceptions to this rule. (see below) In the event that you require additional pain medication, you are responsible for notifying us, as stated below. 8. Medication Agreement: You are responsible for carefully reading and following our Medication Agreement. This must be signed before receiving any prescriptions from our practice. Safely store a copy of your signed Agreement. Violations to the Agreement will result in no further prescriptions. (Additional copies of our Medication Agreement are available upon request.) 9. Laws, Rules, & Regulations: All patients are expected to follow all Federal and [employer], Rules, [employer] Ignorance of the Laws does not constitute a valid excuse. The use of any illegal substances is prohibited. 10. Adopted CDC guidelines & recommendations: Target dosing levels will be at or below 60 MME/day. Use of benzodiazepines** is not recommended.  Exceptions: There are only two exceptions to the rule of not receiving pain medications from other Healthcare Providers. 1. Exception #1 (Emergencies): In the event of an emergency (i.e.: accident requiring emergency care), you are allowed to receive additional pain medication. However, you are responsible for: As soon as you are able, call our office [phone], at any time of the day or night, and leave a message stating your name, the date and nature of the emergency, and the name and dose of the medication prescribed. In the event that your call is answered by a member of our staff, make sure to document and save the date, time, and the name of the person that took your information.  2. Exception #2 (Planned Surgery): In the event that you are scheduled by another doctor or dentist to have any type of surgery or procedure, you are allowed (for a period no longer than 30 days), to receive additional pain medication, for the acute post-op pain. However, in this case, you are responsible for picking up a copy of our "Post-op Pain Management for Surgeons" handout, and giving it to your surgeon or dentist. This document is available at our office, and does not require an appointment to obtain it. Simply go to our office during business hours (Monday-Thursday  from 8:00 AM to 4:00 PM) (Friday 8:00 AM to 12:00 Noon) or if you have a scheduled appointment with [place], prior to your surgery, and ask for it by name. In addition, you will need to provide [place] with your name, name of your surgeon, type of surgery, and date of procedure or surgery.  *Opioid medications include: morphine, codeine, oxycodone, oxymorphone, hydrocodone, hydromorphone, meperidine,  tramadol, tapentadol, buprenorphine, fentanyl, methadone. **Benzodiazepine medications include: diazepam (Valium), alprazolam (Xanax), clonazepam (Klonopine), lorazepam (Ativan), clorazepate (Tranxene), chlordiazepoxide (Librium), estazolam (Prosom), oxazepam (Serax), temazepam (Restoril), triazolam (Halcion)  You were given one prescription for Tramadol today. (Last updated: 09/03/2017) ____________________________________________________________________________________________

## 2017-10-15 NOTE — Progress Notes (Signed)
Nursing Pain Medication Assessment:  Safety precautions to be maintained throughout the outpatient stay will include: orient to surroundings, keep bed in low position, maintain call bell within reach at all times, provide assistance with transfer out of bed and ambulation.  Medication Inspection Compliance: Pill count conducted under aseptic conditions, in front of the patient. Neither the pills nor the bottle was removed from the patient's sight at any time. Once count was completed pills were immediately returned to the patient in their original bottle.  Medication: Tramadol (Ultram) Pill/Patch Count: 36 of 60 pills remain Pill/Patch Appearance: Markings consistent with prescribed medication Bottle Appearance: Standard pharmacy container. Clearly labeled. Filled Date: 03/18 / 2019 Last Medication intake:  Today

## 2017-10-15 NOTE — Patient Instructions (Addendum)
____________________________________________________________________________________________  Medication Rules  Applies to: All patients receiving prescriptions (written or electronic).  Pharmacy of record: Pharmacy where electronic prescriptions will be sent. If written prescriptions are taken to a different pharmacy, please inform the nursing staff. The pharmacy listed in the electronic medical record should be the one where you would like electronic prescriptions to be sent.  Prescription refills: Only during scheduled appointments. Applies to both, written and electronic prescriptions.  NOTE: The following applies primarily to controlled substances (Opioid* Pain Medications).   Patient's responsibilities: 1. Pain Pills: Bring all pain pills to every appointment (except for procedure appointments). 2. Pill Bottles: Bring pills in original pharmacy bottle. Always bring newest bottle. Bring bottle, even if empty. 3. Medication refills: You are responsible for knowing and keeping track of what medications you need refilled. The day before your appointment, write a list of all prescriptions that need to be refilled. Bring that list to your appointment and give it to the admitting nurse. Prescriptions will be written only during appointments. If you forget a medication, it will not be "Called in", "Faxed", or "electronically sent". You will need to get another appointment to get these prescribed. 4. Prescription Accuracy: You are responsible for carefully inspecting your prescriptions before leaving our office. Have the discharge nurse carefully go over each prescription with you, before taking them home. Make sure that your name is accurately spelled, that your address is correct. Check the name and dose of your medication to make sure it is accurate. Check the number of pills, and the written instructions to make sure they are clear and accurate. Make sure that you are given enough medication to last  until your next medication refill appointment. 5. Taking Medication: Take medication as prescribed. Never take more pills than instructed. Never take medication more frequently than prescribed. Taking less pills or less frequently is permitted and encouraged, when it comes to controlled substances (written prescriptions).  6. Inform other Doctors: Always inform, all of your healthcare providers, of all the medications you take. 7. Pain Medication from other Providers: You are not allowed to accept any additional pain medication from any other Doctor or Healthcare provider. There are two exceptions to this rule. (see below) In the event that you require additional pain medication, you are responsible for notifying us, as stated below. 8. Medication Agreement: You are responsible for carefully reading and following our Medication Agreement. This must be signed before receiving any prescriptions from our practice. Safely store a copy of your signed Agreement. Violations to the Agreement will result in no further prescriptions. (Additional copies of our Medication Agreement are available upon request.) 9. Laws, Rules, & Regulations: All patients are expected to follow all Federal and State Laws, Statutes, Rules, & Regulations. Ignorance of the Laws does not constitute a valid excuse. The use of any illegal substances is prohibited. 10. Adopted CDC guidelines & recommendations: Target dosing levels will be at or below 60 MME/day. Use of benzodiazepines** is not recommended.  Exceptions: There are only two exceptions to the rule of not receiving pain medications from other Healthcare Providers. 1. Exception #1 (Emergencies): In the event of an emergency (i.e.: accident requiring emergency care), you are allowed to receive additional pain medication. However, you are responsible for: As soon as you are able, call our office [phone], at any time of the day or night, and leave a message stating your name, the  date and nature of the emergency, and the name and dose of the medication   prescribed. In the event that your call is answered by a member of our staff, make sure to document and save the date, time, and the name of the person that took your information.  2. Exception #2 (Planned Surgery): In the event that you are scheduled by another doctor or dentist to have any type of surgery or procedure, you are allowed (for a period no longer than 30 days), to receive additional pain medication, for the acute post-op pain. However, in this case, you are responsible for picking up a copy of our "Post-op Pain Management for Surgeons" handout, and giving it to your surgeon or dentist. This document is available at our office, and does not require an appointment to obtain it. Simply go to our office during business hours (Monday-Thursday from 8:00 AM to 4:00 PM) (Friday 8:00 AM to 12:00 Noon) or if you have a scheduled appointment with [place], prior to your surgery, and ask for it by name. In addition, you will need to provide [place] with your name, name of your surgeon, type of surgery, and date of procedure or surgery.  *Opioid medications include: morphine, codeine, oxycodone, oxymorphone, hydrocodone, hydromorphone, meperidine, tramadol, tapentadol, buprenorphine, fentanyl, methadone. **Benzodiazepine medications include: diazepam (Valium), alprazolam (Xanax), clonazepam (Klonopine), lorazepam (Ativan), clorazepate (Tranxene), chlordiazepoxide (Librium), estazolam (Prosom), oxazepam (Serax), temazepam (Restoril), triazolam (Halcion)  You were given one prescription for Tramadol today. (Last updated: 09/03/2017) ____________________________________________________________________________________________

## 2017-10-16 ENCOUNTER — Telehealth: Payer: Self-pay

## 2017-10-26 ENCOUNTER — Ambulatory Visit: Payer: Managed Care, Other (non HMO) | Admitting: Pain Medicine

## 2017-10-30 ENCOUNTER — Other Ambulatory Visit: Payer: Self-pay | Admitting: Pain Medicine

## 2017-10-30 DIAGNOSIS — G894 Chronic pain syndrome: Secondary | ICD-10-CM

## 2017-11-02 ENCOUNTER — Telehealth: Payer: Self-pay | Admitting: Nurse Practitioner

## 2017-11-02 NOTE — Telephone Encounter (Signed)
Patient states she called last week and was told her tramadol was going to be called in. Pharmacy does not have any refills.

## 2017-11-02 NOTE — Telephone Encounter (Signed)
She was given a prescription for Tramadol at her last visit. Attempted to call , message left.

## 2017-11-03 NOTE — Telephone Encounter (Signed)
Patient lvmail stating she found her script, apologizes, she will take to pharmacy

## 2017-11-11 ENCOUNTER — Other Ambulatory Visit: Payer: Self-pay

## 2017-11-11 ENCOUNTER — Ambulatory Visit: Payer: Managed Care, Other (non HMO) | Attending: Pain Medicine | Admitting: Pain Medicine

## 2017-11-11 ENCOUNTER — Encounter: Payer: Self-pay | Admitting: Pain Medicine

## 2017-11-11 VITALS — BP 134/86 | HR 77 | Temp 98.2°F

## 2017-11-11 DIAGNOSIS — M4316 Spondylolisthesis, lumbar region: Secondary | ICD-10-CM | POA: Diagnosis not present

## 2017-11-11 DIAGNOSIS — M25561 Pain in right knee: Secondary | ICD-10-CM | POA: Diagnosis not present

## 2017-11-11 DIAGNOSIS — M069 Rheumatoid arthritis, unspecified: Secondary | ICD-10-CM | POA: Diagnosis not present

## 2017-11-11 DIAGNOSIS — F329 Major depressive disorder, single episode, unspecified: Secondary | ICD-10-CM | POA: Diagnosis not present

## 2017-11-11 DIAGNOSIS — M5442 Lumbago with sciatica, left side: Secondary | ICD-10-CM | POA: Diagnosis not present

## 2017-11-11 DIAGNOSIS — M47816 Spondylosis without myelopathy or radiculopathy, lumbar region: Secondary | ICD-10-CM

## 2017-11-11 DIAGNOSIS — M4727 Other spondylosis with radiculopathy, lumbosacral region: Secondary | ICD-10-CM | POA: Diagnosis not present

## 2017-11-11 DIAGNOSIS — M25532 Pain in left wrist: Secondary | ICD-10-CM | POA: Insufficient documentation

## 2017-11-11 DIAGNOSIS — G4700 Insomnia, unspecified: Secondary | ICD-10-CM | POA: Insufficient documentation

## 2017-11-11 DIAGNOSIS — M48061 Spinal stenosis, lumbar region without neurogenic claudication: Secondary | ICD-10-CM | POA: Insufficient documentation

## 2017-11-11 DIAGNOSIS — M25531 Pain in right wrist: Secondary | ICD-10-CM | POA: Diagnosis not present

## 2017-11-11 DIAGNOSIS — M47817 Spondylosis without myelopathy or radiculopathy, lumbosacral region: Secondary | ICD-10-CM | POA: Diagnosis not present

## 2017-11-11 DIAGNOSIS — E559 Vitamin D deficiency, unspecified: Secondary | ICD-10-CM | POA: Diagnosis not present

## 2017-11-11 DIAGNOSIS — Z79899 Other long term (current) drug therapy: Secondary | ICD-10-CM | POA: Insufficient documentation

## 2017-11-11 DIAGNOSIS — M545 Low back pain: Secondary | ICD-10-CM | POA: Diagnosis present

## 2017-11-11 DIAGNOSIS — M25562 Pain in left knee: Secondary | ICD-10-CM | POA: Diagnosis not present

## 2017-11-11 DIAGNOSIS — M5441 Lumbago with sciatica, right side: Secondary | ICD-10-CM

## 2017-11-11 DIAGNOSIS — M4310 Spondylolisthesis, site unspecified: Secondary | ICD-10-CM | POA: Diagnosis not present

## 2017-11-11 DIAGNOSIS — G8929 Other chronic pain: Secondary | ICD-10-CM

## 2017-11-11 DIAGNOSIS — M5136 Other intervertebral disc degeneration, lumbar region: Secondary | ICD-10-CM | POA: Diagnosis not present

## 2017-11-11 DIAGNOSIS — M5126 Other intervertebral disc displacement, lumbar region: Secondary | ICD-10-CM | POA: Diagnosis not present

## 2017-11-11 NOTE — Progress Notes (Signed)
Patient's Name: Candice Guzman  MRN: [id number]  Referring Provider: Steele Sizer, MD  DOB: 20-Apr-1961  PCP: Steele Sizer, MD  DOS: 11/11/2017  Note by: Gaspar Cola, MD  Service setting: Ambulatory outpatient  Specialty: Interventional Pain Management  Location: [facility] (AMB) Pain Management Facility    Patient type: Established   Primary Reason(s) for Visit: Encounter for post-procedure evaluation of chronic illness with mild to moderate exacerbation CC: Back Pain (lower)  HPI  [name] is a 57 y.o. year old, female patient, who comes today for a post-procedure evaluation. She has Insomnia, persistent; Bulge of lumbar disc without myelopathy; Depression, major, single episode, moderate ([place]); Lumbosacral radiculitis; Menopausal symptom; Vitamin D deficiency; Inflammatory arthritis ([place]); Chronic pain of multiple joints; Chronic pain syndrome; Chronic knee pain (Tertiary Area of Pain) (Bilateral); Chronic wrist pain (Fourth Area of Pain) (Bilateral); Disorder of skeletal system; Other long term (current) drug therapy; Long term current use of opiate analgesic; Other specified health status; Long term prescription benzodiazepine use; Chronic lower extremity pain (Secondary Area of Pain) (Bilateral) (R>L); Chronic low back pain (Primary Area of Pain) (Bilateral) (R>L); Pharmacologic therapy; Problems influencing health status; Spondylosis without myelopathy or radiculopathy, lumbosacral region; Chronic hip pain (Bilateral) (R>L); Chronic shoulder pain (Bilateral) (L>R); Grade 1 Anterolisthesis of L5 over S1 & L3 over L4; Grade 1 Retrolisthesis L1 over L2 & L2 over L3; DDD (degenerative disc disease), lumbar; 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 Her primarily concern today is the Back Pain (lower)  Pain Assessment: Location: Lower Back Radiating: Pain radiates down both leg to foot Onset: More than a month ago Duration: Chronic pain Quality: Tightness, Constant, Aching, Pressure Severity: 4 /10 (subjective, self-reported pain score)  Note: Reported level is inconsistent with clinical observations. Clinically the patient looks like a 2/10 A 2/10 is viewed as "Mild to Moderate" and described as noticeable and distracting. Impossible to hide from other people. More frequent flare-ups. Still possible to adapt and function close to normal. It can be very annoying and may have occasional stronger flare-ups. With discipline, patients may get used to it and adapt.       When using our objective Pain Scale, levels between 6 and 10/10 are said to belong in an emergency room, as it progressively worsens from a 6/10, described as severely limiting, requiring emergency care not usually available at an outpatient pain management facility. At a 6/10 level, communication becomes difficult and requires great effort. Assistance to reach the emergency department may be required. Facial flushing and profuse sweating along with potentially dangerous increases in heart rate and blood pressure will be evident. Effect on ADL:  more painful due to working and sitting down for prolonging period of time Timing: Constant Modifying factors: laying down, meds, heating pad BP: 134/86  HR: 77  [name] comes in today for post-procedure evaluation after the treatment done on 10/01/2017.  Further details on both, my assessment(s), as well as the proposed treatment plan, please see below.  Post-Procedure Assessment  10/30/2017 Procedure: Diagnostic bilateral L5 transforaminal LESI #1 under fluoroscopic guidance and IV sedation Pre-procedure pain score:  3/10 Post-procedure pain score: 0/10 (100%  relief) Influential Factors: BMI:   Intra-procedural challenges: None observed.         Assessment  challenges: None detected.              Reported side-effects: None.        Post-procedural adverse reactions or complications: None reported         Sedation: Sedation provided. When no sedatives are used, the analgesic levels obtained are directly associated to the effectiveness of the local anesthetics. However, when sedation is provided, the level of analgesia obtained during the initial 1 hour following the intervention, is believed to be the result of a combination of factors. These factors may include, but are not limited to: 1. The effectiveness of the local anesthetics used. 2. The effects of the analgesic(s) and/or anxiolytic(s) used. 3. The degree of discomfort experienced by the patient at the time of the procedure. 4. The patients ability and reliability in recalling and recording the events. 5. The presence and influence of possible secondary gains and/or psychosocial factors. Reported result: Relief experienced during the 1st hour after the procedure:  50% (Ultra-Short Term Relief)            Interpretative annotation: Clinically appropriate result. Analgesia during this period is likely to be Local Anesthetic and/or IV Sedative (Analgesic/Anxiolytic) related.          Effects of local anesthetic: The analgesic effects attained during this period are directly associated to the localized infiltration of local anesthetics and therefore cary significant diagnostic value as to the etiological location, or anatomical origin, of the pain. Expected duration of relief is directly dependent on the pharmacodynamics of the local anesthetic used. Long-acting (4-6 hours) anesthetics used.  Reported result: Relief during the next 4 to 6 hour after the procedure:  50% (Short-Term Relief)            Interpretative annotation: Clinically appropriate result. Analgesia during this period is likely to be  Local Anesthetic-related.          Long-term benefit: Defined as the period of time past the expected duration of local anesthetics (1 hour for short-acting and 4-6 hours for long-acting). With the possible exception of prolonged sympathetic blockade from the local anesthetics, benefits during this period are typically attributed to, or associated with, other factors such as analgesic sensory neuropraxia, antiinflammatory effects, or beneficial biochemical changes provided by agents other than the local anesthetics.  Reported result: Extended relief following procedure:  10% (Long-Term Relief)            Interpretative annotation: Clinically appropriate result. Good relief. No permanent benefit expected. Inflammation plays a part in the etiology to the pain.          Current benefits: Defined as reported results that persistent at this point in time.   Analgesia: 50 %            Function: Somewhat improved ROM: Somewhat improved Interpretative annotation: Recurrence of symptoms. No permanent benefit expected. Effective diagnostic intervention.          Interpretation: Results would suggest a successful diagnostic intervention.                  Plan:  Please see "Plan of Care" for details.                Laboratory Chemistry  Inflammation Markers (CRP: Acute Phase) (ESR: Chronic Phase) Lab Results  Component Value Date   CRP 2.2 07/08/2017   ESRSEDRATE CANCELED 07/08/2017  Rheumatology Markers Lab Results  Component Value Date   LABURIC 2.5 07/08/2017                        Renal Function Markers Lab Results  Component Value Date   BUN 11 07/08/2017   CREATININE 0.66 07/08/2017   GFRAA 114 07/08/2017   GFRNONAA 99 07/08/2017                              Hepatic Function Markers Lab Results  Component Value Date   AST 17 07/08/2017   ALBUMIN 4.4 07/08/2017   ALKPHOS 133 (H) 07/08/2017                        Electrolytes Lab Results  Component  Value Date   NA 140 07/08/2017   K 4.2 07/08/2017   CL 103 07/08/2017   CALCIUM 9.0 07/08/2017   MG 2.1 07/08/2017                        Neuropathy Markers Lab Results  Component Value Date   VITAMINB12 310 07/08/2017   HGBA1C 5.10 02/11/2016                        Bone Pathology Markers Lab Results  Component Value Date   25OHVITD1 24 (L) 07/08/2017   25OHVITD2 <1.0 07/08/2017   25OHVITD3 24 07/08/2017                         Note: Lab results reviewed.  Recent Diagnostic Imaging Results  DG C-Arm 1-60 Min-No Report Fluoroscopy was utilized by the requesting physician.  No radiographic  interpretation.   Complexity Note: I personally reviewed the fluoroscopic imaging of the procedure.                        Meds   Current Outpatient Medications:  .  ALPRAZolam (XANAX) 0.5 MG tablet, Take 1 tablet (0.5 mg total) at bedtime as needed by mouth., Disp: 30 tablet, Rfl: 0 .  cloNIDine (CATAPRES) 0.1 MG tablet, Take 1 tablet (0.1 mg total) by mouth 2 (two) times daily. Take 1 tablet by mouth two  times daily, Disp: 180 tablet, Rfl: 1 .  DULoxetine (CYMBALTA) 60 MG capsule, Take 2 capsules (120 mg total) by mouth daily., Disp: 180 capsule, Rfl: 1 .  estradiol (ESTRACE) 0.1 MG/GM vaginal cream, Place 1 Applicatorful vaginally at bedtime. Insert 1g nightly for 1 wk, then 1 g once weekly as maintenace, Disp: 42.5 g, Rfl: 1 .  HUMIRA PEN 40 MG/0.8ML PNKT, INJECT Subcutaneously EVERY other WEEK, Disp: , Rfl: 3 .  traMADol (ULTRAM) 50 MG tablet, Take 1 tablet (50 mg total) by mouth every 12 (twelve) hours as needed for severe pain., Disp: 60 tablet, Rfl: 1 .  zolpidem (AMBIEN CR) 12.5 MG CR tablet, Take 1 tablet (12.5 mg total) by mouth at bedtime., Disp: 90 tablet, Rfl: 0  ROS  Constitutional: Denies any fever or chills Gastrointestinal: No reported hemesis, hematochezia, vomiting, or acute GI distress Musculoskeletal: Denies any acute onset joint swelling, redness, loss of ROM, or  weakness Neurological: No reported episodes of acute onset apraxia, aphasia, dysarthria, agnosia, amnesia, paralysis, loss of coordination, or loss of consciousness  Allergies  Ms. Meuth is allergic to codeine and  methotrexate.  PFSH  Drug: Ms. Ulloa  reports that she does not use drugs. Alcohol:  reports that she does not drink alcohol. Tobacco:  reports that she has never smoked. She has never used smokeless tobacco. Medical:  has a past medical history of Depression, Insomnia, Lumbar herniated disc, Menopause, Rheumatoid arthritis ([place]), and Vitamin D deficiency. Surgical: Ms. Backstrom  has a past surgical history that includes Ganglion cyst excision; Cesarean section; Breast biopsy (Left, 2010); Metacarpophalangeal joint arthroplasty (Right, 06/06/2016); and Finger arthroplasty (Left, 07/24/2017). Family: family history includes Cancer in her brother; Cancer (age of onset: 93) in her father; Emphysema in her mother.  Constitutional Exam  General appearance: Well nourished, well developed, and well hydrated. In no apparent acute distress Vitals:   11/11/17 0809  BP: 134/86  Pulse: 77  Temp: 98.2 F (36.8 C)  SpO2: 100%   BMI Assessment: Estimated body mass index is 22.31 kg/m as calculated from the following:   Height as of 10/15/17: _0  (1.626 m).   Weight as of 10/15/17: 130 lb (59 kg).  BMI interpretation table: BMI level Category Range association with higher incidence of chronic pain  <18 kg/m2 Underweight   18.5-24.9 kg/m2 Ideal body weight   25-29.9 kg/m2 Overweight Increased incidence by 20%  30-34.9 kg/m2 Obese (Class I) Increased incidence by 68%  35-39.9 kg/m2 Severe obesity (Class II) Increased incidence by 136%  >40 kg/m2 Extreme obesity (Class III) Increased incidence by 254%   Patient's current BMI Ideal Body weight  There is no height or weight on file to calculate BMI. Patient weight not recorded   BMI Readings from Last 4 Encounters:  10/15/17 22.31  kg/m  10/01/17 22.66 kg/m  09/21/17 22.31 kg/m  08/21/17 22.64 kg/m   Wt Readings from Last 4 Encounters:  10/15/17 130 lb (59 kg)  10/01/17 132 lb (59.9 kg)  09/21/17 130 lb (59 kg)  08/21/17 131 lb 14.4 oz (59.8 kg)  Psych/Mental status: Alert, oriented x 3 (person, place, & time)       Eyes: PERLA Respiratory: No evidence of acute respiratory distress  Cervical Spine Area Exam  Skin & Axial Inspection: No masses, redness, edema, swelling, or associated skin lesions Alignment: Symmetrical Functional ROM: Unrestricted ROM      Stability: No instability detected Muscle Tone/Strength: Functionally intact. No obvious neuro-muscular anomalies detected. Sensory (Neurological): Unimpaired Palpation: No palpable anomalies              Upper Extremity (UE) Exam    Side: Right upper extremity  Side: Left upper extremity  Skin & Extremity Inspection: Skin color, temperature, and hair growth are WNL. No peripheral edema or cyanosis. No masses, redness, swelling, asymmetry, or associated skin lesions. No contractures.  Skin & Extremity Inspection: Skin color, temperature, and hair growth are WNL. No peripheral edema or cyanosis. No masses, redness, swelling, asymmetry, or associated skin lesions. No contractures.  Functional ROM: Unrestricted ROM          Functional ROM: Unrestricted ROM          Muscle Tone/Strength: Functionally intact. No obvious neuro-muscular anomalies detected.  Muscle Tone/Strength: Functionally intact. No obvious neuro-muscular anomalies detected.  Sensory (Neurological): Unimpaired          Sensory (Neurological): Unimpaired          Palpation: No palpable anomalies              Palpation: No palpable anomalies  Specialized Test(s): Deferred         Specialized Test(s): Deferred          Thoracic Spine Area Exam  Skin & Axial Inspection: No masses, redness, or swelling Alignment: Symmetrical Functional ROM: Unrestricted ROM Stability: No instability  detected Muscle Tone/Strength: Functionally intact. No obvious neuro-muscular anomalies detected. Sensory (Neurological): Unimpaired Muscle strength & Tone: No palpable anomalies  Lumbar Spine Area Exam  Skin & Axial Inspection: No masses, redness, or swelling Alignment: Symmetrical Functional ROM: Decreased ROM       Stability: No instability detected Muscle Tone/Strength: Functionally intact. No obvious neuro-muscular anomalies detected. Sensory (Neurological): Movement-associated pain Palpation: Complains of area being tender to palpation       Provocative Tests: Lumbar Hyperextension and rotation test: Positive bilaterally for facet joint pain. Lumbar Lateral bending test: evaluation deferred today       Patrick's Maneuver: evaluation deferred today                    Gait & Posture Assessment  Ambulation: Unassisted Gait: Relatively normal for age and body habitus Posture: Antalgic   Lower Extremity Exam    Side: Right lower extremity  Side: Left lower extremity  Stability: No instability observed          Stability: No instability observed          Skin & Extremity Inspection: Skin color, temperature, and hair growth are WNL. No peripheral edema or cyanosis. No masses, redness, swelling, asymmetry, or associated skin lesions. No contractures.  Skin & Extremity Inspection: Skin color, temperature, and hair growth are WNL. No peripheral edema or cyanosis. No masses, redness, swelling, asymmetry, or associated skin lesions. No contractures.  Functional ROM: Unrestricted ROM                  Functional ROM: Unrestricted ROM                  Muscle Tone/Strength: Functionally intact. No obvious neuro-muscular anomalies detected.  Muscle Tone/Strength: Functionally intact. No obvious neuro-muscular anomalies detected.  Sensory (Neurological): Unimpaired  Sensory (Neurological): Unimpaired  Palpation: No palpable anomalies  Palpation: No palpable anomalies   Assessment  Primary  Diagnosis & Pertinent Problem List: The primary encounter diagnosis was Chronic low back pain (Primary Area of Pain) (Bilateral) (R>L). Diagnoses of Lumbar facet joint syndrome (Bilateral) (R>L), Spondylosis without myelopathy or radiculopathy, lumbosacral region, Lumbar facet hypertrophy (Bilateral: L3-4, L4-5, and L5-S1), Grade 1 Anterolisthesis of L5 over S1 & L3 over L4, and Grade 1 Retrolisthesis L1 over L2 & L2 over L3 were also pertinent to this visit.  Status Diagnosis  Persistent Unimproved Stable 1. Chronic low back pain (Primary Area of Pain) (Bilateral) (R>L)   2. Lumbar facet joint syndrome (Bilateral) (R>L)   3. Spondylosis without myelopathy or radiculopathy, lumbosacral region   4. Lumbar facet hypertrophy (Bilateral: L3-4, L4-5, and L5-S1)   5. Grade 1 Anterolisthesis of L5 over S1 & L3 over L4   6. Grade 1 Retrolisthesis L1 over L2 & L2 over L3     Problems updated and reviewed during this visit: No problems updated.   Time Note: Greater than 50% of the 40 minute(s) of face-to-face time spent with Ms. Badia, was spent in counseling/coordination of care regarding: the results, interpretation and significance of  her recent diagnostic interventional treatment(s), realistic expectations, the importance of providing [place] with accurate post-procedure information and the need to collect and read the AVS material.  Plan of  Care  Pharmacotherapy (Medications Ordered): No orders of the defined types were placed in this encounter.  Medications administered today: Cephus Shelling. Coulson had no medications administered during this visit.   Procedure Orders     LUMBAR FACET(MEDIAL BRANCH NERVE BLOCK) [facility] Lab Orders  No laboratory test(s) ordered today   Imaging Orders  No imaging studies ordered today   Referral Orders  No referral(s) requested today    Interventional management options: Planned, scheduled, and/or pending:   Diagnostic bilateral lumbar facet block #1  under  fluoroscopic guidance and IV sedation    Considering:   Diagnostic bilateral L5 transforaminal LESI  Diagnostic bilateral lumbar facet block  Possible bilateral lumbar facet RFA  Diagnostic bilateral intra-articular hip joint injection  Diagnostic bilateral femoral nerve block + obturator nerve block  Possible bilateral femoral nerve + obturator nerve RFA  Diagnostic bilateral intra-articular shoulder joint injection  Diagnostic bilateral suprascapular nerve block  Possible bilateral suprascapular nerve RFA  Diagnostic Bilateral intra-articular wrist injections  Diagnosticbilateral intra-articular knee injections  Possible bilateral intra-articular Hyalgan knee injection  Diagnostic bilateral genicular nerve block  Possible bilateral genicular nerve RFA    Palliative PRN treatment(s):   None at this time   Provider-requested follow-up: Return for Procedure (w/ sedation): (B) L-FCT BLK.  Future Appointments  Date Time Provider [place]  11/18/2017  2:40 PM Steele Sizer, MD [facility]  11/26/2017 12:45 PM Vevelyn Francois, NP [facility] None   Primary Care Physician: Steele Sizer, MD Location: [facility] Outpatient Pain Management Facility Note by: Gaspar Cola, MD Date: 11/11/2017; Time: 8:57 AM

## 2017-11-11 NOTE — Patient Instructions (Signed)
____________________________________________________________________________________________  Preparing for Procedure with Sedation  Instructions: . Oral Intake: Do not eat or drink anything for at least 8 hours prior to your procedure. . Transportation: Public transportation is not allowed. Bring an adult driver. The driver must be physically present in our waiting room before any procedure can be started. . Physical Assistance: Bring an adult physically capable of assisting you, in the event you need help. This adult should keep you company at home for at least 6 hours after the procedure. . Blood Pressure Medicine: Take your blood pressure medicine with a sip of water the morning of the procedure. . Blood thinners:  . Diabetics on insulin: Notify the staff so that you can be scheduled 1st case in the morning. If your diabetes requires high dose insulin, take only  of your normal insulin dose the morning of the procedure and notify the staff that you have done so. . Preventing infections: Shower with an antibacterial soap the morning of your procedure. . Build-up your immune system: Take 1000 mg of Vitamin C with every meal (3 times a day) the day prior to your procedure. . Antibiotics: Inform the staff if you have a condition or reason that requires you to take antibiotics before dental procedures. . Pregnancy: If you are pregnant, call and cancel the procedure. . Sickness: If you have a cold, fever, or any active infections, call and cancel the procedure. . Arrival: You must be in the facility at least 30 minutes prior to your scheduled procedure. . Children: Do not bring children with you. . Dress appropriately: Bring dark clothing that you would not mind if they get stained. . Valuables: Do not bring any jewelry or valuables.  Procedure appointments are reserved for interventional treatments only. . No Prescription Refills. . No medication changes will be discussed during procedure  appointments. . No disability issues will be discussed.  Remember:  Regular Business hours are:  Monday to Thursday 8:00 AM to 4:00 PM  Provider's Schedule: Zera Markwardt, MD:  Procedure days: Tuesday and Thursday 7:30 AM to 4:00 PM  Bilal Lateef, MD:  Procedure days: Monday and Wednesday 7:30 AM to 4:00 PM ____________________________________________________________________________________________    

## 2017-11-18 ENCOUNTER — Encounter: Payer: Self-pay | Admitting: Family Medicine

## 2017-11-18 ENCOUNTER — Ambulatory Visit: Payer: Managed Care, Other (non HMO) | Admitting: Family Medicine

## 2017-11-18 VITALS — BP 154/88 | HR 106 | Temp 97.6°F | Resp 18 | Ht 64.0 in | Wt 130.9 lb

## 2017-11-18 DIAGNOSIS — M545 Low back pain, unspecified: Secondary | ICD-10-CM

## 2017-11-18 DIAGNOSIS — F321 Major depressive disorder, single episode, moderate: Secondary | ICD-10-CM | POA: Diagnosis not present

## 2017-11-18 DIAGNOSIS — G4700 Insomnia, unspecified: Secondary | ICD-10-CM | POA: Diagnosis not present

## 2017-11-18 DIAGNOSIS — R030 Elevated blood-pressure reading, without diagnosis of hypertension: Secondary | ICD-10-CM

## 2017-11-18 DIAGNOSIS — G8929 Other chronic pain: Secondary | ICD-10-CM

## 2017-11-18 DIAGNOSIS — R000 Tachycardia, unspecified: Secondary | ICD-10-CM

## 2017-11-18 DIAGNOSIS — M0600 Rheumatoid arthritis without rheumatoid factor, unspecified site: Secondary | ICD-10-CM | POA: Diagnosis not present

## 2017-11-18 DIAGNOSIS — N951 Menopausal and female climacteric states: Secondary | ICD-10-CM

## 2017-11-18 MED ORDER — ZOLPIDEM TARTRATE ER 12.5 MG PO TBCR: 12.5000 mg | EXTENDED_RELEASE_TABLET | Freq: Every day | ORAL | 0 refills | Status: DC

## 2017-11-18 MED ORDER — METHOTREXATE (PF) 25 MG/0.5ML ~~LOC~~ SOAJ: 0.6000 mL | SUBCUTANEOUS | 0 refills | Status: DC

## 2017-11-18 NOTE — Patient Instructions (Signed)
Suicidal Feelings: How to Help Yourself  Suicide is the taking of one's own life. If you feel as though life is getting too tough to handle and are thinking about suicide, get help right away. To get help:  Call your local emergency services (911 in the [place]  Call a suicide hotline to speak with a trained counselor who understands how you are feeling. The following is a list of suicide hotlines in the [place]. For a list of hotlines in [place], visit www.suicide.org/hotlines/international/[place]-suicide-hotlines.html.  1-800-273-TALK (1-800-273-8255).  1-800-SUICIDE (1-800-784-2433).  1-888-628-9454. This is a hotline for Spanish speakers.  1-800-799-4TTY (1-800-799-4889). This is a hotline for TTY users.  1-866-4-U-TREVOR (1-866-488-7386). This is a hotline for lesbian, gay, bisexual, transgender, or questioning youth.  Contact a crisis center or a local suicide prevention center. To find a crisis center or suicide prevention center:  Call your local hospital, clinic, community service organization, mental health center, social service provider, or health department. Ask for assistance in connecting to a crisis center.  Visit www.suicidepreventionlifeline.org/getinvolved/locator for a list of crisis centers in the [place], or visit www.suicideprevention.[place]/thinking-about-suicide/find-a-crisis-centre for a list of centers in [place].  Visit the following websites:  National Suicide Prevention Lifeline: www.suicidepreventionlifeline.org  Hopeline: www.hopeline.com  American Foundation for Suicide Prevention: www.afsp.org  The Trevor Project (for lesbian, gay, bisexual, transgender, or questioning youth): www.thetrevorproject.org    How can I help myself feel better?  Promise yourself that you will not do anything drastic when you have suicidal feelings. Remember, there is hope. Many people have gotten through suicidal thoughts and feelings, and you will, too. You may have gotten through them before, and  this proves that you can get through them again.  Let family, friends, teachers, or counselors know how you are feeling. Try not to isolate yourself from those who care about you. Remember, they will want to help you. Talk with someone every day, even if you do not feel sociable. Face-to-face conversation is best.  Call a mental health professional and see one regularly.  Visit your primary health care provider every year.  Eat a well-balanced diet, and space your meals so you eat regularly.  Get plenty of rest.  Avoid alcohol and drugs, and remove them from your home. They will only make you feel worse.  If you are thinking of taking a lot of medicine, give your medicine to someone who can give it to you one day at a time. If you are on antidepressants and are concerned you will overdose, let your health care provider know so he or she can give you safer medicines. Ask your mental health professional about the possible side effects of any medicines you are taking.  Remove weapons, poisons, knives, and anything else that could harm you from your home.  Try to stick to routines. Follow a schedule every day. Put self-care on your schedule.  Make a list of realistic goals, and cross them off when you achieve them. Accomplishments give a sense of worth.  Wait until you are feeling better before doing the things you find difficult or unpleasant.  Exercise if you are able. You will feel better if you exercise for even a half hour each day.  Go out in the sun or into nature. This will help you recover from depression faster. If you have a favorite place to walk, go there.  Do the things that have always given you pleasure. Play your favorite music, read a good book, paint a picture, play your favorite   instrument, or do anything else that takes your mind off your depression if it is safe to do.  Keep your living space well lit.  When you are feeling well, write yourself a letter about tips and support that you can read when  you are not feeling well.  Remember that life's difficulties can be sorted out with help. Conditions can be treated. You can work on thoughts and strategies that serve you well.  This information is not intended to replace advice given to you by your health care provider. Make sure you discuss any questions you have with your health care provider.  Document Released: 12/28/2002 Document Revised: 02/20/2016 Document Reviewed: 10/18/2013  Elsevier Interactive Patient Education  2018 [employer] Inc[employer]

## 2017-11-18 NOTE — Addendum Note (Signed)
Addended by: Alba Cory F on: 11/18/2017 05:12 PM   Modules accepted: Orders
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
## 2017-11-26 ENCOUNTER — Other Ambulatory Visit: Payer: Self-pay

## 2017-11-26 ENCOUNTER — Encounter: Payer: Self-pay | Admitting: Nurse Practitioner

## 2017-11-26 ENCOUNTER — Ambulatory Visit: Payer: Managed Care, Other (non HMO) | Attending: Nurse Practitioner | Admitting: Nurse Practitioner

## 2017-11-26 VITALS — BP 145/83 | HR 87 | Temp 98.3°F | Resp 16 | Ht 64.0 in | Wt 130.0 lb

## 2017-11-26 DIAGNOSIS — M25561 Pain in right knee: Secondary | ICD-10-CM | POA: Diagnosis not present

## 2017-11-26 DIAGNOSIS — M25512 Pain in left shoulder: Secondary | ICD-10-CM | POA: Insufficient documentation

## 2017-11-26 DIAGNOSIS — M25562 Pain in left knee: Secondary | ICD-10-CM | POA: Insufficient documentation

## 2017-11-26 DIAGNOSIS — E559 Vitamin D deficiency, unspecified: Secondary | ICD-10-CM | POA: Diagnosis not present

## 2017-11-26 DIAGNOSIS — M545 Low back pain: Secondary | ICD-10-CM | POA: Insufficient documentation

## 2017-11-26 DIAGNOSIS — M48061 Spinal stenosis, lumbar region without neurogenic claudication: Secondary | ICD-10-CM | POA: Diagnosis not present

## 2017-11-26 DIAGNOSIS — M25532 Pain in left wrist: Secondary | ICD-10-CM | POA: Insufficient documentation

## 2017-11-26 DIAGNOSIS — G894 Chronic pain syndrome: Secondary | ICD-10-CM | POA: Diagnosis not present

## 2017-11-26 DIAGNOSIS — M47817 Spondylosis without myelopathy or radiculopathy, lumbosacral region: Secondary | ICD-10-CM | POA: Insufficient documentation

## 2017-11-26 DIAGNOSIS — M7918 Myalgia, other site: Secondary | ICD-10-CM | POA: Insufficient documentation

## 2017-11-26 DIAGNOSIS — M25531 Pain in right wrist: Secondary | ICD-10-CM | POA: Diagnosis not present

## 2017-11-26 DIAGNOSIS — G8929 Other chronic pain: Secondary | ICD-10-CM

## 2017-11-26 DIAGNOSIS — Z79891 Long term (current) use of opiate analgesic: Secondary | ICD-10-CM | POA: Insufficient documentation

## 2017-11-26 DIAGNOSIS — F321 Major depressive disorder, single episode, moderate: Secondary | ICD-10-CM | POA: Insufficient documentation

## 2017-11-26 DIAGNOSIS — G4700 Insomnia, unspecified: Secondary | ICD-10-CM | POA: Diagnosis not present

## 2017-11-26 DIAGNOSIS — M8938 Hypertrophy of bone, other site: Secondary | ICD-10-CM | POA: Diagnosis not present

## 2017-11-26 DIAGNOSIS — M069 Rheumatoid arthritis, unspecified: Secondary | ICD-10-CM | POA: Diagnosis not present

## 2017-11-26 DIAGNOSIS — M25511 Pain in right shoulder: Secondary | ICD-10-CM | POA: Diagnosis not present

## 2017-11-26 DIAGNOSIS — F329 Major depressive disorder, single episode, unspecified: Secondary | ICD-10-CM | POA: Diagnosis not present

## 2017-11-26 DIAGNOSIS — M5126 Other intervertebral disc displacement, lumbar region: Secondary | ICD-10-CM | POA: Insufficient documentation

## 2017-11-26 DIAGNOSIS — Z79899 Other long term (current) drug therapy: Secondary | ICD-10-CM | POA: Insufficient documentation

## 2017-11-26 DIAGNOSIS — M5136 Other intervertebral disc degeneration, lumbar region: Secondary | ICD-10-CM | POA: Insufficient documentation

## 2017-11-26 DIAGNOSIS — M47816 Spondylosis without myelopathy or radiculopathy, lumbar region: Secondary | ICD-10-CM | POA: Diagnosis not present

## 2017-11-26 MED ORDER — TRAMADOL HCL 50 MG PO TABS: 50.0000 mg | ORAL_TABLET | Freq: Two times a day (BID) | ORAL | 2 refills | Status: DC | PRN

## 2017-11-26 NOTE — Patient Instructions (Addendum)
You have been given Rx fro Tramadol to last until 03/20/2018  ____________________________________________________________________________________________  Medication Rules  Applies to: All patients receiving prescriptions (written or electronic).  Pharmacy of record: Pharmacy where electronic prescriptions will be sent. If written prescriptions are taken to a different pharmacy, please inform the nursing staff. The pharmacy listed in the electronic medical record should be the one where you would like electronic prescriptions to be sent.  Prescription refills: Only during scheduled appointments. Applies to both, written and electronic prescriptions.  NOTE: The following applies primarily to controlled substances (Opioid* Pain Medications).   Patient's responsibilities: 1. Pain Pills: Bring all pain pills to every appointment (except for procedure appointments). 2. Pill Bottles: Bring pills in original pharmacy bottle. Always bring newest bottle. Bring bottle, even if empty. 3. Medication refills: You are responsible for knowing and keeping track of what medications you need refilled. The day before your appointment, write a list of all prescriptions that need to be refilled. Bring that list to your appointment and give it to the admitting nurse. Prescriptions will be written only during appointments. If you forget a medication, it will not be "Called in", "Faxed", or "electronically sent". You will need to get another appointment to get these prescribed. 4. Prescription Accuracy: You are responsible for carefully inspecting your prescriptions before leaving our office. Have the discharge nurse carefully go over each prescription with you, before taking them home. Make sure that your name is accurately spelled, that your address is correct. Check the name and dose of your medication to make sure it is accurate. Check the number of pills, and the written instructions to make sure they are clear and  accurate. Make sure that you are given enough medication to last until your next medication refill appointment. 5. Taking Medication: Take medication as prescribed. Never take more pills than instructed. Never take medication more frequently than prescribed. Taking less pills or less frequently is permitted and encouraged, when it comes to controlled substances (written prescriptions).  6. Inform other Doctors: Always inform, all of your healthcare providers, of all the medications you take. 7. Pain Medication from other Providers: You are not allowed to accept any additional pain medication from any other Doctor or Healthcare provider. There are two exceptions to this rule. (see below) In the event that you require additional pain medication, you are responsible for notifying us, as stated below. 8. Medication Agreement: You are responsible for carefully reading and following our Medication Agreement. This must be signed before receiving any prescriptions from our practice. Safely store a copy of your signed Agreement. Violations to the Agreement will result in no further prescriptions. (Additional copies of our Medication Agreement are available upon request.) 9. Laws, Rules, & Regulations: All patients are expected to follow all [address] and [employer], Rules, [employer]. Ignorance of the Laws does not constitute a valid excuse. The use of any illegal substances is prohibited. 10. Adopted CDC guidelines & recommendations: Target dosing levels will be at or below 60 MME/day. Use of benzodiazepines** is not recommended.  Exceptions: There are only two exceptions to the rule of not receiving pain medications from other Healthcare Providers. 1. Exception #1 (Emergencies): In the event of an emergency (i.e.: accident requiring emergency care), you are allowed to receive additional pain medication. However, you are responsible for: As soon as you are able, call our office [phone], at any  time of the day or night, and leave a message stating your name, the date and  nature of the emergency, and the name and dose of the medication prescribed. In the event that your call is answered by a member of our staff, make sure to document and save the date, time, and the name of the person that took your information.  2. Exception #2 (Planned Surgery): In the event that you are scheduled by another doctor or dentist to have any type of surgery or procedure, you are allowed (for a period no longer than 30 days), to receive additional pain medication, for the acute post-op pain. However, in this case, you are responsible for picking up a copy of our "Post-op Pain Management for Surgeons" handout, and giving it to your surgeon or dentist. This document is available at our office, and does not require an appointment to obtain it. Simply go to our office during business hours (Monday-Thursday from 8:00 AM to 4:00 PM) (Friday 8:00 AM to 12:00 Noon) or if you have a scheduled appointment with [place], prior to your surgery, and ask for it by name. In addition, you will need to provide [place] with your name, name of your surgeon, type of surgery, and date of procedure or surgery.  *Opioid medications include: morphine, codeine, oxycodone, oxymorphone, hydrocodone, hydromorphone, meperidine, tramadol, tapentadol, buprenorphine, fentanyl, methadone. **Benzodiazepine medications include: diazepam (Valium), alprazolam (Xanax), clonazepam (Klonopine), lorazepam (Ativan), clorazepate (Tranxene), chlordiazepoxide (Librium), estazolam (Prosom), oxazepam (Serax), temazepam (Restoril), triazolam (Halcion) (Last updated: 09/03/2017) ____________________________________________________________________________________________

## 2017-11-26 NOTE — Progress Notes (Signed)
Nursing Pain Medication Assessment:  Safety precautions to be maintained throughout the outpatient stay will include: orient to surroundings, keep bed in low position, maintain call bell within reach at all times, provide assistance with transfer out of bed and ambulation.  Medication Inspection Compliance: Pill count conducted under aseptic conditions, in front of the patient. Neither the pills nor the bottle was removed from the patient's sight at any time. Once count was completed pills were immediately returned to the patient in their original bottle.  Medication: Tramadol (Ultram) Pill/Patch Count: 35 of 60 pills remain Pill/Patch Appearance: Markings consistent with prescribed medication Bottle Appearance: Standard pharmacy container. Clearly labeled. Filled Date: 3 / 29 / 2019 Last Medication intake:  Today

## 2017-11-26 NOTE — Progress Notes (Signed)
Patient's Name: Candice Guzman  MRN: [id number]  Referring Provider: Steele Sizer, MD  DOB: May 28, 1961  PCP: Steele Sizer, MD  DOS: 11/26/2017  Note by: Vevelyn Francois NP  Service setting: Ambulatory outpatient  Specialty: Interventional Pain Management  Location: [facility] (AMB) Pain Management Facility    Patient type: Established    Primary Reason(s) for Visit: Encounter for prescription drug management. (Level of risk: moderate)  CC: Back Pain (lower)  HPI  Ms. Stradford is a 57 y.o. year old, female patient, who comes today for a medication management evaluation. She has Insomnia, persistent; Bulge of lumbar disc without myelopathy; Depression, major, single episode, moderate ([place]); Lumbosacral radiculitis; Menopausal symptom; Vitamin D deficiency; Inflammatory arthritis ([place]); Chronic pain of multiple joints; Chronic pain syndrome; Chronic knee pain (Tertiary Area of Pain) (Bilateral); Chronic wrist pain (Fourth Area of Pain) (Bilateral); Disorder of skeletal system; Other long term (current) drug therapy; Long term current use of opiate analgesic; Other specified health status; Long term prescription benzodiazepine use; Chronic lower extremity pain (Secondary Area of Pain) (Bilateral) (R>L); Chronic low back pain (Primary Area of Pain) (Bilateral) (R>L); Pharmacologic therapy; Problems influencing health status; Spondylosis without myelopathy or radiculopathy, lumbosacral region; Chronic hip pain (Bilateral) (R>L); Chronic shoulder pain (Bilateral) (L>R); Grade 1 Anterolisthesis of L5 over S1 & L3 over L4; Grade 1 Retrolisthesis L1 over L2 & L2 over L3; DDD (degenerative disc disease), lumbar; 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 Her primarily concern today is the Back Pain (lower)  Pain Assessment: Location: Lower, Right, Left Back Radiating: down the backs of both legs to the tops of both feet; c/o swelling over the past 2 days Onset: More than a month ago Duration: Chronic pain Quality: Throbbing, Constant Severity: 3 /10 (subjective, self-reported pain score)  Note: Reported level is compatible with observation.                          Effect on ADL: more painful due to working and sitting down for long periods of time Timing: Constant Modifying factors: medications, reclining and heating pad BP: (!) 145/83  HR: 87  Ms. Klett was last scheduled for an appointment on 11/02/2017 for medication management. During today's appointment we reviewed Ms. Pottle's chronic pain status, as well as her outpatient medication regimen. She admits that she has more numbness than tingling. She also has swelling in the right ankle and feet. She continues to take her MTX and humeria.   The patient  reports that she does not use drugs. Her body mass index is 22.31 kg/m.  Further details on both, my assessment(s), as well as the proposed treatment plan, please see below.  Controlled Substance Pharmacotherapy Assessment REMS (Risk Evaluation and Mitigation Strategy)  Analgesic:Tramadol 50 mg1 tab PO BID (100 mg/day) MME/day:77m/day   WRise Patience RN  11/26/2017 12:48 PM  Sign at close encounter Nursing Pain Medication Assessment:  Safety precautions to be maintained throughout the outpatient stay will include: orient to surroundings, keep bed in low position, maintain call bell within reach at all times, provide assistance with transfer out of bed and ambulation.  Medication Inspection Compliance: Pill count conducted under aseptic conditions, in front of the patient. Neither the pills nor the bottle  was removed from the patient's sight at any time. Once count was completed  pills were immediately returned to the patient in their original bottle.  Medication: Tramadol (Ultram) Pill/Patch Count: 35 of 60 pills remain Pill/Patch Appearance: Markings consistent with prescribed medication Bottle Appearance: Standard pharmacy container. Clearly labeled. Filled Date: 3 / 80 / 2019 Last Medication intake:  Today   Pharmacokinetics: Liberation and absorption (onset of action): WNL Distribution (time to peak effect): WNL Metabolism and excretion (duration of action): WNL         Pharmacodynamics: Desired effects: Analgesia: Ms. Mermelstein reports >50% benefit. Functional ability: Patient reports that medication allows her to accomplish basic ADLs Clinically meaningful improvement in function (CMIF): Sustained CMIF goals met Perceived effectiveness: Described as relatively effective, allowing for increase in activities of daily living (ADL) Undesirable effects: Side-effects or Adverse reactions: None reported Monitoring: [place] PMP: Online review of the past 38-monthperiod conducted. Compliant with practice rules and regulations Last UDS on record: Summary  Date Value Ref Range Status  07/08/2017 FINAL  Final    Comment:    ==================================================================== [facility] COMP DRUG ANALYSIS,UR ==================================================================== Test                             Result       Flag       Units Drug Present and Declared for Prescription Verification   Alpha-hydroxyalprazolam        55           EXPECTED   ng/mg creat    Alpha-hydroxyalprazolam is an expected metabolite of alprazolam.    Source of alprazolam is a scheduled prescription medication.   Tramadol                       3138         EXPECTED   ng/mg creat   O-Desmethyltramadol            3363         EXPECTED   ng/mg creat   N-Desmethyltramadol            2280         EXPECTED   ng/mg creat    Source of tramadol is a prescription medication.     O-desmethyltramadol and N-desmethyltramadol are expected    metabolites of tramadol.   Zolpidem Acid                  PRESENT      EXPECTED    Zolpidem acid is an expected metabolite of zolpidem.   Duloxetine                     PRESENT      EXPECTED Drug Present not Declared for Prescription Verification   Doxylamine                     PRESENT      UNEXPECTED Drug Absent but Declared for Prescription Verification   Tizanidine                     Not Detected UNEXPECTED    Tizanidine, as indicated in the declared medication list, is not    always detected even when used as directed.   Clonidine                      Not Detected UNEXPECTED ====================================================================  Test                      Result    Flag   Units      Ref Range   Creatinine              40               mg/dL      >=20 ==================================================================== Declared Medications:  The flagging and interpretation on this report are based on the  following declared medications.  Unexpected results may arise from  inaccuracies in the declared medications.  **Note: The testing scope of this panel includes these medications:  Alprazolam (Xanax)  Clonidine (Catapres)  Duloxetine (Cymbalta)  Tramadol (Ultram)  **Note: The testing scope of this panel does not include small to  moderate amounts of these reported medications:  Tizanidine (Zanaflex)  Zolpidem (Ambien)  **Note: The testing scope of this panel does not include following  reported medications:  Adalimumab (Humira)  Meloxicam (Mobic)  Methotrexate ==================================================================== For clinical consultation, please call [phone]. ====================================================================    UDS interpretation: Compliant          Medication Assessment Form: Reviewed. Patient indicates being compliant with therapy Treatment compliance:  Compliant Risk Assessment Profile: Aberrant behavior: See prior evaluations. None observed or detected today Comorbid factors increasing risk of overdose: See prior notes. No additional risks detected today Risk of substance use disorder (SUD): Low Opioid Risk Tool - 11/26/17 1256      Family History of Substance Abuse   Alcohol  Negative    Illegal Drugs  Negative    Rx Drugs  Negative      Personal History of Substance Abuse   Alcohol  Negative    Illegal Drugs  Negative    Rx Drugs  Negative      Age   Age between 35-45 years   No      History of Preadolescent Sexual Abuse   History of Preadolescent Sexual Abuse  Negative or Female      Psychological Disease   Psychological Disease  Negative    Depression  Positive      Total Score   Opioid Risk Tool Scoring  1    Opioid Risk Interpretation  Low Risk      ORT Scoring interpretation table:  Score <3 = Low Risk for SUD  Score between 4-7 = Moderate Risk for SUD  Score >8 = High Risk for Opioid Abuse   Risk Mitigation Strategies:  Patient Counseling: Covered Patient-Prescriber Agreement (PPA): Present and active  Notification to other healthcare providers: Done  Pharmacologic Plan: No change in therapy, at this time.             Laboratory Chemistry  Inflammation Markers (CRP: Acute Phase) (ESR: Chronic Phase) Lab Results  Component Value Date   CRP 2.2 07/08/2017   ESRSEDRATE CANCELED 07/08/2017                         Rheumatology Markers Lab Results  Component Value Date   LABURIC 2.5 07/08/2017                        Renal Function Markers Lab Results  Component Value Date   BUN 11 07/08/2017   CREATININE 0.66 07/08/2017   BCR 17 07/08/2017   GFRAA 114 07/08/2017   GFRNONAA 99 07/08/2017  Hepatic Function Markers Lab Results  Component Value Date   AST 17 07/08/2017   ALBUMIN 4.4 07/08/2017   ALKPHOS 133 (H) 07/08/2017                        Electrolytes Lab  Results  Component Value Date   NA 140 07/08/2017   K 4.2 07/08/2017   CL 103 07/08/2017   CALCIUM 9.0 07/08/2017   MG 2.1 07/08/2017                        Neuropathy Markers Lab Results  Component Value Date   VITAMINB12 310 07/08/2017   HGBA1C 5.10 02/11/2016                        Bone Pathology Markers Lab Results  Component Value Date   25OHVITD1 24 (L) 07/08/2017   25OHVITD2 <1.0 07/08/2017   25OHVITD3 24 07/08/2017                         Coagulation Parameters No results found for: INR, LABPROT, APTT, PLT, DDIMER                      Cardiovascular Markers No results found for: BNP, CKTOTAL, CKMB, TROPONINI, HGB, HCT                       CA Markers No results found for: CEA, CA125, LABCA2                      Note: Lab results reviewed.  Recent Diagnostic Imaging Results  DG C-Arm 1-60 Min-No Report Fluoroscopy was utilized by the requesting physician.  No radiographic  interpretation.   Complexity Note: Imaging results reviewed. Results shared with Ms. Maravilla, using [employer].                         Meds   Current Outpatient Medications:  .  ALPRAZolam (XANAX) 0.5 MG tablet, Take 1 tablet (0.5 mg total) at bedtime as needed by mouth., Disp: 30 tablet, Rfl: 0 .  cloNIDine (CATAPRES) 0.1 MG tablet, Take 1 tablet (0.1 mg total) by mouth 2 (two) times daily. Take 1 tablet by mouth two  times daily, Disp: 180 tablet, Rfl: 1 .  DULoxetine (CYMBALTA) 60 MG capsule, Take 2 capsules (120 mg total) by mouth daily., Disp: 180 capsule, Rfl: 1 .  estradiol (ESTRACE) 0.1 MG/GM vaginal cream, Place 1 Applicatorful vaginally at bedtime. Insert 1g nightly for 1 wk, then 1 g once weekly as maintenace, Disp: 42.5 g, Rfl: 1 .  HUMIRA PEN 40 MG/0.8ML PNKT, INJECT Subcutaneously EVERY other WEEK, Disp: , Rfl: 3 .  Methotrexate, PF, 25 MG/0.5ML SOAJ, Inject 0.6 mLs into the skin once a week., Disp: 2 mL, Rfl: 0 .  [START ON 12/20/2017] traMADol (ULTRAM) 50 MG tablet, Take  1 tablet (50 mg total) by mouth every 12 (twelve) hours as needed for severe pain., Disp: 60 tablet, Rfl: 2 .  zolpidem (AMBIEN CR) 12.5 MG CR tablet, Take 1 tablet (12.5 mg total) by mouth at bedtime., Disp: 90 tablet, Rfl: 0  ROS  Constitutional: Denies any fever or chills Gastrointestinal: No reported hemesis, hematochezia, vomiting, or acute GI distress Musculoskeletal: Denies any acute onset joint swelling, redness, loss of ROM, or weakness Neurological:  No reported episodes of acute onset apraxia, aphasia, dysarthria, agnosia, amnesia, paralysis, loss of coordination, or loss of consciousness  Allergies  Ms. Dorantes is allergic to codeine and methotrexate.  PFSH  Drug: Ms. Dobosz  reports that she does not use drugs. Alcohol:  reports that she does not drink alcohol. Tobacco:  reports that she has never smoked. She has never used smokeless tobacco. Medical:  has a past medical history of Depression, Insomnia, Lumbar herniated disc, Menopause, Rheumatoid arthritis ([place]), and Vitamin D deficiency. Surgical: Ms. Roseman  has a past surgical history that includes Ganglion cyst excision; Cesarean section; Breast biopsy (Left, 2010); Metacarpophalangeal joint arthroplasty (Right, 06/06/2016); and Finger arthroplasty (Left, 07/24/2017). Family: family history includes Cancer in her brother; Cancer (age of onset: 55) in her father; Emphysema in her mother.  Constitutional Exam  General appearance: Well nourished, well developed, and well hydrated. In no apparent acute distress Vitals:   11/26/17 1244  BP: (!) 145/83  Pulse: 87  Resp: 16  Temp: 98.3 F (36.8 C)  TempSrc: Oral  SpO2: 100%  Weight: 130 lb (59 kg)  Height: _0  (1.626 m)  Psych/Mental status: Alert, oriented x 3 (person, place, & time)       Eyes: PERLA Respiratory: No evidence of acute respiratory distress  Cervical Spine Area Exam  Skin & Axial Inspection: No masses, redness, edema, swelling, or associated skin  lesions Alignment: Symmetrical Functional ROM: Unrestricted ROM      Stability: No instability detected Muscle Tone/Strength: Functionally intact. No obvious neuro-muscular anomalies detected. Sensory (Neurological): Unimpaired Palpation: No palpable anomalies              Upper Extremity (UE) Exam    Side: Right upper extremity  Side: Left upper extremity  Skin & Extremity Inspection: Skin color, temperature, and hair growth are WNL. No peripheral edema or cyanosis. No masses, redness, swelling, asymmetry, or associated skin lesions. No contractures.  Skin & Extremity Inspection: Skin color, temperature, and hair growth are WNL. No peripheral edema or cyanosis. No masses, redness, swelling, asymmetry, or associated skin lesions. No contractures.  Functional ROM: Unrestricted ROM          Functional ROM: Unrestricted ROM          Muscle Tone/Strength: Functionally intact. No obvious neuro-muscular anomalies detected.  Muscle Tone/Strength: Functionally intact. No obvious neuro-muscular anomalies detected.  Sensory (Neurological): Unimpaired          Sensory (Neurological): Unimpaired          Palpation: No palpable anomalies              Palpation: No palpable anomalies              Provocative Test(s):  Phalen's test: deferred Tinel's test: deferred Apley's scratch test (touch opposite shoulder):  Action 1 (Across chest): deferred Action 2 (Overhead): deferred Action 3 (LB reach): deferred   Provocative Test(s):  Phalen's test: deferred Tinel's test: deferred Apley's scratch test (touch opposite shoulder):  Action 1 (Across chest): deferred Action 2 (Overhead): deferred Action 3 (LB reach): deferred    Thoracic Spine Area Exam  Skin & Axial Inspection: No masses, redness, or swelling Alignment: Symmetrical Functional ROM: Unrestricted ROM Stability: No instability detected Muscle Tone/Strength: Functionally intact. No obvious neuro-muscular anomalies detected. Sensory  (Neurological): Unimpaired Muscle strength & Tone: No palpable anomalies  Lumbar Spine Area Exam  Skin & Axial Inspection: No masses, redness, or swelling Alignment: Symmetrical Functional ROM: Unrestricted ROM  Stability: No instability detected Muscle Tone/Strength: Functionally intact. No obvious neuro-muscular anomalies detected. Sensory (Neurological): Unimpaired Palpation: No palpable anomalies       Provocative Tests: Lumbar Hyperextension/rotation test: deferred today       Lumbar quadrant test (Kemp's test): deferred today       Lumbar Lateral bending test: deferred today       Patrick's Maneuver: deferred today                   FABER test: deferred today       Thigh-thrust test: deferred today       S-I compression test: deferred today       S-I distraction test: deferred today        Gait & Posture Assessment  Ambulation: Unassisted Gait: Relatively normal for age and body habitus Posture: WNL   Lower Extremity Exam    Side: Right lower extremity  Side: Left lower extremity  Stability: No instability observed          Stability: No instability observed          Skin & Extremity Inspection: Skin color, temperature, and hair growth are WNL. No peripheral edema or cyanosis. No masses, redness, swelling, asymmetry, or associated skin lesions. No contractures.  Skin & Extremity Inspection: Skin color, temperature, and hair growth are WNL. No peripheral edema or cyanosis. No masses, redness, swelling, asymmetry, or associated skin lesions. No contractures.  Functional ROM: Unrestricted ROM                  Functional ROM: Unrestricted ROM                  Muscle Tone/Strength: Functionally intact. No obvious neuro-muscular anomalies detected.  Muscle Tone/Strength: Functionally intact. No obvious neuro-muscular anomalies detected.  Sensory (Neurological): Unimpaired  Sensory (Neurological): Unimpaired  Palpation: No palpable anomalies  Palpation: No palpable anomalies    Assessment  Primary Diagnosis & Pertinent Problem List: The primary encounter diagnosis was Lumbar facet joint syndrome (Bilateral) (R>L). Diagnoses of Spondylosis without myelopathy or radiculopathy, lumbosacral region, Chronic knee pain (Tertiary Area of Pain) (Bilateral), and Chronic pain syndrome were also pertinent to this visit.  Status Diagnosis  Controlled Controlled Controlled 1. Lumbar facet joint syndrome (Bilateral) (R>L)   2. Spondylosis without myelopathy or radiculopathy, lumbosacral region   3. Chronic knee pain (Tertiary Area of Pain) (Bilateral)   4. Chronic pain syndrome     Problems updated and reviewed during this visit: No problems updated. Plan of Care  Pharmacotherapy (Medications Ordered): Meds ordered this encounter  Medications  . traMADol (ULTRAM) 50 MG tablet    Sig: Take 1 tablet (50 mg total) by mouth every 12 (twelve) hours as needed for severe pain.    Dispense:  60 tablet    Refill:  2    Fill one day early if pharmacy is closed on scheduled refill date. Do not fill until: 12/20/2017 To last until: 03/20/18    Order Specific Question:   Supervising Provider    Answer:   Milinda Pointer [phone]   New Prescriptions   No medications on file   Medications administered today: Athalia Setterlund. [place] had no medications administered during this visit. Lab-work, procedure(s), and/or referral(s): No orders of the defined types were placed in this encounter.  Imaging and/or referral(s): None   Interventional management options: Planned, scheduled, and/or pending: Not at this time.    Considering: Diagnostic bilateral L5 transforaminal LESI Diagnostic bilateral lumbar facet block  Possible bilateral lumbar facet RFA Diagnostic bilateral intra-articular hip joint injection Diagnostic bilateral femoral nerve block + obturator nerve block Possible bilateral femoral nerve + obturator nerve RFA Diagnostic bilateral intra-articular  shoulder joint injection Diagnostic bilateral suprascapular nerve block Possible bilateral suprascapular nerve RFA Diagnostic Bilateral intra-articular wrist injections Diagnosticbilateral intra-articular knee injections Possible bilateral intra-articular Hyalgan knee injection Diagnostic bilateral genicular nerve block Possible bilateral genicular nerve RFA   PRN Procedures: None at this time      Provider-requested follow-up: Return in about 1 month (around 12/24/2017) for MedMgmt with Me Donella Stade Edison Pace).  Future Appointments  Date Time Provider [place]  12/14/2017  1:45 PM Vevelyn Francois, NP [facility] None  02/19/2018  3:00 PM Steele Sizer, MD [facility]   Primary Care Physician: Steele Sizer, MD Location: [facility] Note by: Vevelyn Francois NP Date: 11/26/2017; Time: 4:16 PM  Pain Score Disclaimer: We use the NRS-11 scale. This is a self-reported, subjective measurement of pain severity with only modest accuracy. It is used primarily to identify changes within a particular patient. It must be understood that outpatient pain scales are significantly less accurate that those used for research, where they can be applied under ideal controlled circumstances with minimal exposure to variables. In reality, the score is likely to be a combination of pain intensity and pain affect, where pain affect describes the degree of emotional arousal or changes in action readiness caused by the sensory experience of pain. Factors such as social and work situation, setting, emotional state, anxiety levels, expectation, and prior pain experience may influence pain perception and show large inter-individual differences that may also be affected by time variables.  Patient instructions provided during this appointment: Patient Instructions  You have been given Rx fro Tramadol to last until  03/20/2018  ____________________________________________________________________________________________  Medication Rules  Applies to: All patients receiving prescriptions (written or electronic).  Pharmacy of record: Pharmacy where electronic prescriptions will be sent. If written prescriptions are taken to a different pharmacy, please inform the nursing staff. The pharmacy listed in the electronic medical record should be the one where you would like electronic prescriptions to be sent.  Prescription refills: Only during scheduled appointments. Applies to both, written and electronic prescriptions.  NOTE: The following applies primarily to controlled substances (Opioid* Pain Medications).   Patient's responsibilities: 1. Pain Pills: Bring all pain pills to every appointment (except for procedure appointments). 2. Pill Bottles: Bring pills in original pharmacy bottle. Always bring newest bottle. Bring bottle, even if empty. 3. Medication refills: You are responsible for knowing and keeping track of what medications you need refilled. The day before your appointment, write a list of all prescriptions that need to be refilled. Bring that list to your appointment and give it to the admitting nurse. Prescriptions will be written only during appointments. If you forget a medication, it will not be "Called in", "Faxed", or "electronically sent". You will need to get another appointment to get these prescribed. 4. Prescription Accuracy: You are responsible for carefully inspecting your prescriptions before leaving our office. Have the discharge nurse carefully go over each prescription with you, before taking them home. Make sure that your name is accurately spelled, that your address is correct. Check the name and dose of your medication to make sure it is accurate. Check the number of pills, and the written instructions to make sure they are clear and accurate. Make sure that you are given enough  medication to last until your next  medication refill appointment. 5. Taking Medication: Take medication as prescribed. Never take more pills than instructed. Never take medication more frequently than prescribed. Taking less pills or less frequently is permitted and encouraged, when it comes to controlled substances (written prescriptions).  6. Inform other Doctors: Always inform, all of your healthcare providers, of all the medications you take. 7. Pain Medication from other Providers: You are not allowed to accept any additional pain medication from any other Doctor or Healthcare provider. There are two exceptions to this rule. (see below) In the event that you require additional pain medication, you are responsible for notifying us, as stated below. 8. Medication Agreement: You are responsible for carefully reading and following our Medication Agreement. This must be signed before receiving any prescriptions from our practice. Safely store a copy of your signed Agreement. Violations to the Agreement will result in no further prescriptions. (Additional copies of our Medication Agreement are available upon request.) 9. Laws, Rules, & Regulations: All patients are expected to follow all Federal and [employer], [employer], Rules, [employer]. Ignorance of the Laws does not constitute a valid excuse. The use of any illegal substances is prohibited. 10. Adopted CDC guidelines & recommendations: Target dosing levels will be at or below 60 MME/day. Use of benzodiazepines** is not recommended.  Exceptions: There are only two exceptions to the rule of not receiving pain medications from other Healthcare Providers. 1. Exception #1 (Emergencies): In the event of an emergency (i.e.: accident requiring emergency care), you are allowed to receive additional pain medication. However, you are responsible for: As soon as you are able, call our office [phone], at any time of the day or night, and leave a message  stating your name, the date and nature of the emergency, and the name and dose of the medication prescribed. In the event that your call is answered by a member of our staff, make sure to document and save the date, time, and the name of the person that took your information.  2. Exception #2 (Planned Surgery): In the event that you are scheduled by another doctor or dentist to have any type of surgery or procedure, you are allowed (for a period no longer than 30 days), to receive additional pain medication, for the acute post-op pain. However, in this case, you are responsible for picking up a copy of our "Post-op Pain Management for Surgeons" handout, and giving it to your surgeon or dentist. This document is available at our office, and does not require an appointment to obtain it. Simply go to our office during business hours (Monday-Thursday from 8:00 AM to 4:00 PM) (Friday 8:00 AM to 12:00 Noon) or if you have a scheduled appointment with [place], prior to your surgery, and ask for it by name. In addition, you will need to provide [place] with your name, name of your surgeon, type of surgery, and date of procedure or surgery.  *Opioid medications include: morphine, codeine, oxycodone, oxymorphone, hydrocodone, hydromorphone, meperidine, tramadol, tapentadol, buprenorphine, fentanyl, methadone. **Benzodiazepine medications include: diazepam (Valium), alprazolam (Xanax), clonazepam (Klonopine), lorazepam (Ativan), clorazepate (Tranxene), chlordiazepoxide (Librium), estazolam (Prosom), oxazepam (Serax), temazepam (Restoril), triazolam (Halcion) (Last updated: 09/03/2017) ____________________________________________________________________________________________

## 2017-12-03 ENCOUNTER — Other Ambulatory Visit: Payer: Self-pay | Admitting: Family Medicine

## 2017-12-03 DIAGNOSIS — G4700 Insomnia, unspecified: Secondary | ICD-10-CM

## 2017-12-14 ENCOUNTER — Encounter: Payer: Managed Care, Other (non HMO) | Admitting: Nurse Practitioner

## 2018-01-05 ENCOUNTER — Other Ambulatory Visit: Payer: Self-pay

## 2018-01-05 ENCOUNTER — Telehealth: Payer: Self-pay | Admitting: *Deleted

## 2018-01-05 ENCOUNTER — Encounter: Payer: Self-pay | Admitting: Nurse Practitioner

## 2018-01-05 ENCOUNTER — Ambulatory Visit: Payer: Managed Care, Other (non HMO) | Attending: Nurse Practitioner | Admitting: Nurse Practitioner

## 2018-01-05 VITALS — BP 158/89 | HR 73 | Temp 98.1°F | Resp 16 | Ht 64.0 in | Wt 130.0 lb

## 2018-01-05 DIAGNOSIS — M8938 Hypertrophy of bone, other site: Secondary | ICD-10-CM | POA: Insufficient documentation

## 2018-01-05 DIAGNOSIS — M47819 Spondylosis without myelopathy or radiculopathy, site unspecified: Secondary | ICD-10-CM | POA: Insufficient documentation

## 2018-01-05 DIAGNOSIS — M25551 Pain in right hip: Secondary | ICD-10-CM | POA: Insufficient documentation

## 2018-01-05 DIAGNOSIS — Z780 Asymptomatic menopausal state: Secondary | ICD-10-CM | POA: Insufficient documentation

## 2018-01-05 DIAGNOSIS — M47817 Spondylosis without myelopathy or radiculopathy, lumbosacral region: Secondary | ICD-10-CM | POA: Diagnosis not present

## 2018-01-05 DIAGNOSIS — M47897 Other spondylosis, lumbosacral region: Secondary | ICD-10-CM | POA: Diagnosis not present

## 2018-01-05 DIAGNOSIS — M25561 Pain in right knee: Secondary | ICD-10-CM | POA: Insufficient documentation

## 2018-01-05 DIAGNOSIS — M2550 Pain in unspecified joint: Secondary | ICD-10-CM

## 2018-01-05 DIAGNOSIS — M5136 Other intervertebral disc degeneration, lumbar region: Secondary | ICD-10-CM

## 2018-01-05 DIAGNOSIS — G894 Chronic pain syndrome: Secondary | ICD-10-CM | POA: Insufficient documentation

## 2018-01-05 DIAGNOSIS — Z5181 Encounter for therapeutic drug level monitoring: Secondary | ICD-10-CM | POA: Insufficient documentation

## 2018-01-05 DIAGNOSIS — M25512 Pain in left shoulder: Secondary | ICD-10-CM | POA: Insufficient documentation

## 2018-01-05 DIAGNOSIS — M48061 Spinal stenosis, lumbar region without neurogenic claudication: Secondary | ICD-10-CM | POA: Insufficient documentation

## 2018-01-05 DIAGNOSIS — F329 Major depressive disorder, single episode, unspecified: Secondary | ICD-10-CM | POA: Insufficient documentation

## 2018-01-05 DIAGNOSIS — M25531 Pain in right wrist: Secondary | ICD-10-CM | POA: Insufficient documentation

## 2018-01-05 DIAGNOSIS — F1390 Sedative, hypnotic, or anxiolytic use, unspecified, uncomplicated: Secondary | ICD-10-CM | POA: Diagnosis not present

## 2018-01-05 DIAGNOSIS — M47896 Other spondylosis, lumbar region: Secondary | ICD-10-CM | POA: Insufficient documentation

## 2018-01-05 DIAGNOSIS — M5417 Radiculopathy, lumbosacral region: Secondary | ICD-10-CM | POA: Insufficient documentation

## 2018-01-05 DIAGNOSIS — E559 Vitamin D deficiency, unspecified: Secondary | ICD-10-CM | POA: Insufficient documentation

## 2018-01-05 DIAGNOSIS — M25532 Pain in left wrist: Secondary | ICD-10-CM | POA: Diagnosis not present

## 2018-01-05 DIAGNOSIS — M4807 Spinal stenosis, lumbosacral region: Secondary | ICD-10-CM | POA: Diagnosis not present

## 2018-01-05 DIAGNOSIS — M25511 Pain in right shoulder: Secondary | ICD-10-CM | POA: Diagnosis not present

## 2018-01-05 DIAGNOSIS — M545 Low back pain: Secondary | ICD-10-CM | POA: Insufficient documentation

## 2018-01-05 DIAGNOSIS — G4700 Insomnia, unspecified: Secondary | ICD-10-CM | POA: Insufficient documentation

## 2018-01-05 DIAGNOSIS — M069 Rheumatoid arthritis, unspecified: Secondary | ICD-10-CM | POA: Insufficient documentation

## 2018-01-05 DIAGNOSIS — M7918 Myalgia, other site: Secondary | ICD-10-CM | POA: Diagnosis not present

## 2018-01-05 DIAGNOSIS — M4316 Spondylolisthesis, lumbar region: Secondary | ICD-10-CM | POA: Insufficient documentation

## 2018-01-05 DIAGNOSIS — M25562 Pain in left knee: Secondary | ICD-10-CM | POA: Diagnosis not present

## 2018-01-05 DIAGNOSIS — M79605 Pain in left leg: Secondary | ICD-10-CM | POA: Diagnosis not present

## 2018-01-05 DIAGNOSIS — G8929 Other chronic pain: Secondary | ICD-10-CM

## 2018-01-05 DIAGNOSIS — M79604 Pain in right leg: Secondary | ICD-10-CM | POA: Diagnosis not present

## 2018-01-05 DIAGNOSIS — M5106 Intervertebral disc disorders with myelopathy, lumbar region: Secondary | ICD-10-CM | POA: Insufficient documentation

## 2018-01-05 DIAGNOSIS — Z79899 Other long term (current) drug therapy: Secondary | ICD-10-CM | POA: Insufficient documentation

## 2018-01-05 DIAGNOSIS — M25552 Pain in left hip: Secondary | ICD-10-CM | POA: Insufficient documentation

## 2018-01-05 DIAGNOSIS — Z79891 Long term (current) use of opiate analgesic: Secondary | ICD-10-CM | POA: Insufficient documentation

## 2018-01-05 DIAGNOSIS — Z885 Allergy status to narcotic agent status: Secondary | ICD-10-CM | POA: Insufficient documentation

## 2018-01-05 MED ORDER — TRAMADOL HCL 50 MG PO TABS: 50.0000 mg | ORAL_TABLET | Freq: Two times a day (BID) | ORAL | 2 refills | Status: DC | PRN

## 2018-01-05 NOTE — Telephone Encounter (Signed)
Called patient back to let her know that she failed to get her Rx and this appt today.  Left message on mobile phone # to please come and pick up Rx for Tramadol at her convenience.

## 2018-01-05 NOTE — Progress Notes (Signed)
Nursing Pain Medication Assessment:  Safety precautions to be maintained throughout the outpatient stay will include: orient to surroundings, keep bed in low position, maintain call bell within reach at all times, provide assistance with transfer out of bed and ambulation.  Medication Inspection Compliance: Pill count conducted under aseptic conditions, in front of the patient. Neither the pills nor the bottle was removed from the patient's sight at any time. Once count was completed pills were immediately returned to the patient in their original bottle.  Medication: Tramadol (Ultram) Pill/Patch Count: 43 of 60 pills remain Pill/Patch Appearance: Markings consistent with prescribed medication Bottle Appearance: Standard pharmacy container. Clearly labeled. Filled Date: 6 / 24 / 2019 Last Medication intake:  Yesterday

## 2018-01-05 NOTE — Patient Instructions (Signed)
____________________________________________________________________________________________  Medication Rules  Applies to: All patients receiving prescriptions (written or electronic).  Pharmacy of record: Pharmacy where electronic prescriptions will be sent. If written prescriptions are taken to a different pharmacy, please inform the nursing staff. The pharmacy listed in the electronic medical record should be the one where you would like electronic prescriptions to be sent.  Prescription refills: Only during scheduled appointments. Applies to both, written and electronic prescriptions.  NOTE: The following applies primarily to controlled substances (Opioid* Pain Medications).   Patient's responsibilities: 1. Pain Pills: Bring all pain pills to every appointment (except for procedure appointments). 2. Pill Bottles: Bring pills in original pharmacy bottle. Always bring newest bottle. Bring bottle, even if empty. 3. Medication refills: You are responsible for knowing and keeping track of what medications you need refilled. The day before your appointment, write a list of all prescriptions that need to be refilled. Bring that list to your appointment and give it to the admitting nurse. Prescriptions will be written only during appointments. If you forget a medication, it will not be "Called in", "Faxed", or "electronically sent". You will need to get another appointment to get these prescribed. 4. Prescription Accuracy: You are responsible for carefully inspecting your prescriptions before leaving our office. Have the discharge nurse carefully go over each prescription with you, before taking them home. Make sure that your name is accurately spelled, that your address is correct. Check the name and dose of your medication to make sure it is accurate. Check the number of pills, and the written instructions to make sure they are clear and accurate. Make sure that you are given enough medication to last  until your next medication refill appointment. 5. Taking Medication: Take medication as prescribed. Never take more pills than instructed. Never take medication more frequently than prescribed. Taking less pills or less frequently is permitted and encouraged, when it comes to controlled substances (written prescriptions).  6. Inform other Doctors: Always inform, all of your healthcare providers, of all the medications you take. 7. Pain Medication from other Providers: You are not allowed to accept any additional pain medication from any other Doctor or Healthcare provider. There are two exceptions to this rule. (see below) In the event that you require additional pain medication, you are responsible for notifying us, as stated below. 8. Medication Agreement: You are responsible for carefully reading and following our Medication Agreement. This must be signed before receiving any prescriptions from our practice. Safely store a copy of your signed Agreement. Violations to the Agreement will result in no further prescriptions. (Additional copies of our Medication Agreement are available upon request.) 9. Laws, Rules, & Regulations: All patients are expected to follow all Federal and State Laws, Statutes, Rules, & Regulations. Ignorance of the Laws does not constitute a valid excuse. The use of any illegal substances is prohibited. 10. Adopted CDC guidelines & recommendations: Target dosing levels will be at or below 60 MME/day. Use of benzodiazepines** is not recommended.  Exceptions: There are only two exceptions to the rule of not receiving pain medications from other Healthcare Providers. 1. Exception #1 (Emergencies): In the event of an emergency (i.e.: accident requiring emergency care), you are allowed to receive additional pain medication. However, you are responsible for: As soon as you are able, call our office [phone], at any time of the day or night, and leave a message stating your name, the  date and nature of the emergency, and the name and dose of the medication   prescribed. In the event that your call is answered by a member of our staff, make sure to document and save the date, time, and the name of the person that took your information.  2. Exception #2 (Planned Surgery): In the event that you are scheduled by another doctor or dentist to have any type of surgery or procedure, you are allowed (for a period no longer than 30 days), to receive additional pain medication, for the acute post-op pain. However, in this case, you are responsible for picking up a copy of our "Post-op Pain Management for Surgeons" handout, and giving it to your surgeon or dentist. This document is available at our office, and does not require an appointment to obtain it. Simply go to our office during business hours (Monday-Thursday from 8:00 AM to 4:00 PM) (Friday 8:00 AM to 12:00 Noon) or if you have a scheduled appointment with us, prior to your surgery, and ask for it by name. In addition, you will need to provide us with your name, name of your surgeon, type of surgery, and date of procedure or surgery.  *Opioid medications include: morphine, codeine, oxycodone, oxymorphone, hydrocodone, hydromorphone, meperidine, tramadol, tapentadol, buprenorphine, fentanyl, methadone. **Benzodiazepine medications include: diazepam (Valium), alprazolam (Xanax), clonazepam (Klonopine), lorazepam (Ativan), clorazepate (Tranxene), chlordiazepoxide (Librium), estazolam (Prosom), oxazepam (Serax), temazepam (Restoril), triazolam (Halcion) (Last updated: 09/03/2017) ____________________________________________________________________________________________    

## 2018-01-05 NOTE — Progress Notes (Signed)
Patient's Name: Candice Guzman  MRN: [id number]  Referring Provider: Steele Sizer, MD  DOB: Mar 21, 1961  PCP: Steele Sizer, MD  DOS: 01/05/2018  Note by: Vevelyn Francois NP  Service setting: Ambulatory outpatient  Specialty: Interventional Pain Management  Location: [facility] (AMB) Pain Management Facility    Patient type: Established    Primary Reason(s) for Visit: Encounter for prescription drug management. (Level of risk: moderate)  CC: Back Pain (lower)  HPI  Candice Guzman is a 57 y.o. year old, female patient, who comes today for a medication management evaluation. She has Insomnia, persistent; Bulge of lumbar disc without myelopathy; Depression, major, single episode, moderate ([place]); Lumbosacral radiculitis; Menopausal symptom; Vitamin D deficiency; Inflammatory arthritis ([place]); Chronic pain of multiple joints; Chronic pain syndrome; Chronic knee pain (Tertiary Area of Pain) (Bilateral); Chronic wrist pain (Fourth Area of Pain) (Bilateral); Disorder of skeletal system; Other long term (current) drug therapy; Long term current use of opiate analgesic; Other specified health status; Long term prescription benzodiazepine use; Chronic lower extremity pain (Secondary Area of Pain) (Bilateral) (R>L); Chronic low back pain (Primary Area of Pain) (Bilateral) (R>L); Pharmacologic therapy; Problems influencing health status; Spondylosis without myelopathy or radiculopathy, lumbosacral region; Chronic hip pain (Bilateral) (R>L); Chronic shoulder pain (Bilateral) (L>R); Grade 1 Anterolisthesis of L5 over S1 & L3 over L4; Grade 1 Retrolisthesis L1 over L2 & L2 over L3; DDD (degenerative disc disease), lumbar; 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 Her primarily concern today is the Back Pain (lower)  Pain Assessment: Location:   Back Radiating: down back of both legs to the top of both feet,  sweeling off and on in hands and knees Quality: Aching, Constant, Discomfort Severity: 2 /10 (subjective, self-reported pain score)  Note: Reported level is compatible with observation.                          BP: (!) 158/89  HR: 73  Candice Guzman was last scheduled for an appointment on 11/26/2017 for medication management. During today's appointment we reviewed Candice Guzman's chronic pain status, as well as her outpatient medication regimen. She denies any concerns today. She continues to work at [employer].  The patient  reports that she does not use drugs. Her body mass index is 22.31 kg/m.  Further details on both, my assessment(s), as well as the proposed treatment plan, please see below.  Controlled Substance Pharmacotherapy Assessment REMS (Risk Evaluation and Mitigation Strategy)  Analgesic:Tramadol 50 mg1 tab PO BID (100 mg/day) MME/day:76m/day GIgnatius Specking RN  01/05/2018  2:05 PM  Sign at close encounter Nursing Pain Medication Assessment:  Safety precautions to be maintained throughout the outpatient stay will include: orient to surroundings, keep bed in low position, maintain call bell within reach at all times, provide assistance with transfer out of bed and ambulation.  Medication Inspection Compliance: Pill count conducted under aseptic conditions, in front of the patient. Neither the pills nor the bottle was removed from the patient's sight at any time. Once count was completed pills were immediately returned to the patient in their original bottle.  Medication: Tramadol (Ultram) Pill/Patch Count: 43 of 60 pills remain Pill/Patch Appearance: Markings consistent with prescribed medication Bottle Appearance: Standard pharmacy container. Clearly labeled. Filled  Date: 72 / 24 /  2019 Last Medication intake:  Yesterday   Pharmacokinetics: Liberation and absorption (onset of action): WNL Distribution (time to peak effect): WNL Metabolism and excretion (duration of action): WNL         Pharmacodynamics: Desired effects: Analgesia: Candice Guzman reports >50% benefit. Functional ability: Patient reports that medication allows her to accomplish basic ADLs Clinically meaningful improvement in function (CMIF): Sustained CMIF goals met Perceived effectiveness: Described as relatively effective, allowing for increase in activities of daily living (ADL) Undesirable effects: Side-effects or Adverse reactions: None reported Monitoring: [place] PMP: Online review of the past 53-monthperiod conducted. Compliant with practice rules and regulations Last UDS on record: Summary  Date Value Ref Range Status  07/08/2017 FINAL  Final    Comment:    ==================================================================== TOXASSURE COMP DRUG ANALYSIS,UR ==================================================================== Test                             Result       Flag       Units Drug Present and Declared for Prescription Verification   Alpha-hydroxyalprazolam        55           EXPECTED   ng/mg creat    Alpha-hydroxyalprazolam is an expected metabolite of alprazolam.    Source of alprazolam is a scheduled prescription medication.   Tramadol                       3138         EXPECTED   ng/mg creat   O-Desmethyltramadol            3363         EXPECTED   ng/mg creat   N-Desmethyltramadol            2280         EXPECTED   ng/mg creat    Source of tramadol is a prescription medication.    O-desmethyltramadol and N-desmethyltramadol are expected    metabolites of tramadol.   Zolpidem Acid                  PRESENT      EXPECTED    Zolpidem acid is an expected metabolite of zolpidem.   Duloxetine                     PRESENT      EXPECTED Drug Present not Declared for Prescription  Verification   Doxylamine                     PRESENT      UNEXPECTED Drug Absent but Declared for Prescription Verification   Tizanidine                     Not Detected UNEXPECTED    Tizanidine, as indicated in the declared medication list, is not    always detected even when used as directed.   Clonidine                      Not Detected UNEXPECTED ==================================================================== Test                      Result    Flag   Units      Ref Range   Creatinine  40               mg/dL      >=20 ==================================================================== Declared Medications:  The flagging and interpretation on this report are based on the  following declared medications.  Unexpected results may arise from  inaccuracies in the declared medications.  **Note: The testing scope of this panel includes these medications:  Alprazolam (Xanax)  Clonidine (Catapres)  Duloxetine (Cymbalta)  Tramadol (Ultram)  **Note: The testing scope of this panel does not include small to  moderate amounts of these reported medications:  Tizanidine (Zanaflex)  Zolpidem (Ambien)  **Note: The testing scope of this panel does not include following  reported medications:  Adalimumab (Humira)  Meloxicam (Mobic)  Methotrexate ==================================================================== For clinical consultation, please call [phone]. ====================================================================    UDS interpretation: Compliant          Medication Assessment Form: Reviewed. Patient indicates being compliant with therapy Treatment compliance: Compliant Risk Assessment Profile: Aberrant behavior: See prior evaluations. None observed or detected today Comorbid factors increasing risk of overdose: See prior notes. No additional risks detected today Risk of substance use disorder (SUD): Low Opioid Risk Tool - 01/05/18 1403      Family  History of Substance Abuse   Alcohol  Negative    Illegal Drugs  Negative    Rx Drugs  Negative      Personal History of Substance Abuse   Alcohol  Negative    Illegal Drugs  Negative    Rx Drugs  Negative      Psychological Disease   Psychological Disease  Negative    Depression  Positive      Total Score   Opioid Risk Tool Scoring  1    Opioid Risk Interpretation  Low Risk      ORT Scoring interpretation table:  Score <3 = Low Risk for SUD  Score between 4-7 = Moderate Risk for SUD  Score >8 = High Risk for Opioid Abuse   Risk Mitigation Strategies:  Patient Counseling: Covered Patient-Prescriber Agreement (PPA): Present and active  Notification to other healthcare providers: Done  Pharmacologic Plan: No change in therapy, at this time.             Laboratory Chemistry  Inflammation Markers (CRP: Acute Phase) (ESR: Chronic Phase) Lab Results  Component Value Date   CRP 2.2 07/08/2017   ESRSEDRATE CANCELED 07/08/2017                         Rheumatology Markers Lab Results  Component Value Date   LABURIC 2.5 07/08/2017                        Renal Function Markers Lab Results  Component Value Date   BUN 11 07/08/2017   CREATININE 0.66 07/08/2017   BCR 17 07/08/2017   GFRAA 114 07/08/2017   GFRNONAA 99 07/08/2017                             Hepatic Function Markers Lab Results  Component Value Date   AST 17 07/08/2017   ALBUMIN 4.4 07/08/2017   ALKPHOS 133 (H) 07/08/2017                        Electrolytes Lab Results  Component Value Date   NA 140 07/08/2017   K 4.2 07/08/2017   CL  103 07/08/2017   CALCIUM 9.0 07/08/2017   MG 2.1 07/08/2017                        Neuropathy Markers Lab Results  Component Value Date   VITAMINB12 310 07/08/2017   HGBA1C 5.10 02/11/2016                        Bone Pathology Markers Lab Results  Component Value Date   25OHVITD1 24 (L) 07/08/2017   25OHVITD2 <1.0 07/08/2017   25OHVITD3 24 07/08/2017                          Coagulation Parameters No results found for: INR, LABPROT, APTT, PLT, DDIMER                      Cardiovascular Markers No results found for: BNP, CKTOTAL, CKMB, TROPONINI, HGB, HCT                       CA Markers No results found for: CEA, CA125, LABCA2                      Note: Lab results reviewed.  Recent Diagnostic Imaging Results  DG C-Arm 1-60 Min-No Report Fluoroscopy was utilized by the requesting physician.  No radiographic  interpretation.   Complexity Note: Imaging results reviewed. Results shared with Candice Guzman, using [employer].                         Meds   Current Outpatient Medications:  .  ALPRAZolam (XANAX) 0.5 MG tablet, Take 1 tablet (0.5 mg total) at bedtime as needed by mouth., Disp: 30 tablet, Rfl: 0 .  cloNIDine (CATAPRES) 0.1 MG tablet, Take 1 tablet (0.1 mg total) by mouth 2 (two) times daily. Take 1 tablet by mouth two  times daily, Disp: 180 tablet, Rfl: 1 .  DULoxetine (CYMBALTA) 60 MG capsule, Take 2 capsules (120 mg total) by mouth daily., Disp: 180 capsule, Rfl: 1 .  estradiol (ESTRACE) 0.1 MG/GM vaginal cream, Place 1 Applicatorful vaginally at bedtime. Insert 1g nightly for 1 wk, then 1 g once weekly as maintenace, Disp: 42.5 g, Rfl: 1 .  HUMIRA PEN 40 MG/0.8ML PNKT, INJECT Subcutaneously EVERY other WEEK, Disp: , Rfl: 3 .  Methotrexate, PF, 25 MG/0.5ML SOAJ, Inject 0.6 mLs into the skin once a week., Disp: 2 mL, Rfl: 0 .  [START ON 03/20/2018] traMADol (ULTRAM) 50 MG tablet, Take 1 tablet (50 mg total) by mouth every 12 (twelve) hours as needed for severe pain., Disp: 60 tablet, Rfl: 2 .  zolpidem (AMBIEN CR) 12.5 MG CR tablet, Take 1 tablet (12.5 mg total) by mouth at bedtime., Disp: 90 tablet, Rfl: 0  ROS  Constitutional: Denies any fever or chills Gastrointestinal: No reported hemesis, hematochezia, vomiting, or acute GI distress Musculoskeletal: Denies any acute onset joint swelling, redness, loss of ROM, or  weakness Neurological: No reported episodes of acute onset apraxia, aphasia, dysarthria, agnosia, amnesia, paralysis, loss of coordination, or loss of consciousness  Allergies  Candice Guzman is allergic to codeine and methotrexate.  PFSH  Drug: Candice Guzman  reports that she does not use drugs. Alcohol:  reports that she does not drink alcohol. Tobacco:  reports that she has never smoked. She has never used smokeless tobacco. Medical:  has a past medical history of Depression, Insomnia, Lumbar herniated disc, Menopause, Rheumatoid arthritis ([place]), and Vitamin D deficiency. Surgical: Candice Guzman  has a past surgical history that includes Ganglion cyst excision; Cesarean section; Breast biopsy (Left, 2010); Metacarpophalangeal joint arthroplasty (Right, 06/06/2016); and Finger arthroplasty (Left, 07/24/2017). Family: family history includes Cancer in her brother; Cancer (age of onset: 80) in her father; Emphysema in her mother.  Constitutional Exam  General appearance: Well nourished, well developed, and well hydrated. In no apparent acute distress Vitals:   01/05/18 1353  BP: (!) 158/89  Pulse: 73  Resp: 16  Temp: 98.1 F (36.7 C)  SpO2: 100%  Weight: 130 lb (59 kg)  Height: 5' 4"  (1.626 m)   BMI Assessment: Estimated body mass index is 22.31 kg/m as calculated from the following:   Height as of this encounter: 5' 4"  (1.626 m).   Weight as of this encounter: 130 lb (59 kg). Psych/Mental status: Alert, oriented x 3 (person, place, & time)       Eyes: PERLA Respiratory: No evidence of acute respiratory distress  Cervical Spine Area Exam  Skin & Axial Inspection: No masses, redness, edema, swelling, or associated skin lesions Alignment: Symmetrical Functional ROM: Unrestricted ROM      Stability: No instability detected Muscle Tone/Strength: Functionally intact. No obvious neuro-muscular anomalies detected. Sensory (Neurological): Unimpaired Palpation: No palpable anomalies               Upper Extremity (UE) Exam    Side: Right upper extremity  Side: Left upper extremity  Skin & Extremity Inspection: Heberden's nodes (DIP)  Skin & Extremity Inspection: Heberden's nodes (DIP)  Functional ROM: Decreased ROM          Functional ROM: Adequate ROM          Muscle Tone/Strength: Functionally intact. No obvious neuro-muscular anomalies detected.  Muscle Tone/Strength: Functionally intact. No obvious neuro-muscular anomalies detected.  Sensory (Neurological): Unimpaired          Sensory (Neurological): Unimpaired          Palpation: No palpable anomalies              Palpation: No palpable anomalies              Provocative Test(s):  Phalen's test: deferred Tinel's test: deferred Apley's scratch test (touch opposite shoulder):  Action 1 (Across chest): deferred Action 2 (Overhead): deferred Action 3 (LB reach): deferred   Provocative Test(s):  Phalen's test: deferred Tinel's test: deferred Apley's scratch test (touch opposite shoulder):  Action 1 (Across chest): deferred Action 2 (Overhead): deferred Action 3 (LB reach): deferred    Thoracic Spine Area Exam  Skin & Axial Inspection: No masses, redness, or swelling Alignment: Symmetrical Functional ROM: Unrestricted ROM Stability: No instability detected Muscle Tone/Strength: Functionally intact. No obvious neuro-muscular anomalies detected. Sensory (Neurological): Unimpaired Muscle strength & Tone: No palpable anomalies  Lumbar Spine Area Exam  Skin & Axial Inspection: No masses, redness, or swelling Alignment: Symmetrical Functional ROM: Unrestricted ROM       Stability: No instability detected Muscle Tone/Strength: Functionally intact. No obvious neuro-muscular anomalies detected. Sensory (Neurological): Unimpaired Palpation: No palpable anomalies       Provocative Tests: Lumbar Hyperextension/rotation test: deferred today       Lumbar quadrant test (Kemp's test): deferred today       Lumbar Lateral bending  test: deferred today       Patrick's Maneuver: deferred today  FABER test: deferred today       Thigh-thrust test: deferred today       S-I compression test: deferred today       S-I distraction test: deferred today        Gait & Posture Assessment  Ambulation: Unassisted Gait: Relatively normal for age and body habitus Posture: WNL   Lower Extremity Exam    Side: Right lower extremity  Side: Left lower extremity  Stability: No instability observed          Stability: No instability observed          Skin & Extremity Inspection: Skin color, temperature, and hair growth are WNL. No peripheral edema or cyanosis. No masses, redness, swelling, asymmetry, or associated skin lesions. No contractures.  Skin & Extremity Inspection: Skin color, temperature, and hair growth are WNL. No peripheral edema or cyanosis. No masses, redness, swelling, asymmetry, or associated skin lesions. No contractures.  Functional ROM: Unrestricted ROM                  Functional ROM: Unrestricted ROM                  Muscle Tone/Strength: Functionally intact. No obvious neuro-muscular anomalies detected.  Muscle Tone/Strength: Functionally intact. No obvious neuro-muscular anomalies detected.  Sensory (Neurological): Unimpaired  Sensory (Neurological): Unimpaired  Palpation: No palpable anomalies  Palpation: No palpable anomalies   Assessment  Primary Diagnosis & Pertinent Problem List: The primary encounter diagnosis was Spondylosis without myelopathy or radiculopathy, lumbosacral region. Diagnoses of Chronic pain of multiple joints, DDD (degenerative disc disease), lumbar, Chronic pain syndrome, and Long term prescription benzodiazepine use were also pertinent to this visit.  Status Diagnosis  Controlled Persistent Controlled 1. Spondylosis without myelopathy or radiculopathy, lumbosacral region   2. Chronic pain of multiple joints   3. DDD (degenerative disc disease), lumbar   4. Chronic pain  syndrome   5. 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 traMADol (ULTRAM) 50 MG tablet    Sig: Take 1 tablet (50 mg total) by mouth every 12 (twelve) hours as needed for severe pain.    Dispense:  60 tablet    Refill:  2    Fill one day early if pharmacy is closed on scheduled refill date. Do not fill until: 03/20/2018 To last until: 06/18/2018    Order Specific Question:   Supervising Provider    Answer:   Milinda Pointer [phone]   New Prescriptions   No  medications on file   Medications administered today: Candice Guzman had no medications administered during this visit. Lab-work, procedure(s), and/or referral(s): Orders Placed This Encounter  Procedures  . ToxASSURE Select 13 (MW), Urine   Imaging and/or referral(s): None  Interventional management options: Planned, scheduled, and/or pending: Not at this time.     Considering: Diagnostic bilateral L5 transforaminal LESI Diagnostic bilateral lumbar facet block Possible bilateral lumbar facet RFA Diagnostic bilateral intra-articular hip joint injection Diagnostic bilateral femoral nerve block + obturator nerve block Possible bilateral femoral nerve + obturator nerve RFA Diagnostic bilateral intra-articular shoulder joint injection Diagnostic bilateral suprascapular nerve block Possible bilateral suprascapular nerve RFA Diagnostic Bilateral intra-articular wrist injections Diagnosticbilateral intra-articular knee injections Possible bilateral intra-articular Hyalgan knee injection Diagnostic bilateral genicular nerve block Possible bilateral genicular nerve RFA   PRN Procedures: None at this time   Provider-requested follow-up: Return in about 5 months (around 06/07/2018) for MedMgmt with Me Donella Stade Edison Pace).  Future Appointments  Date Time Provider [place]  02/19/2018  3:00 PM Steele Sizer, MD [place] [facility]  06/07/2018  2:30 PM Vevelyn Francois, NP [facility] None   Primary Care Physician: Steele Sizer, MD Location: [facility] Outpatient Pain Management Facility Note by: Vevelyn Francois NP Date: 01/05/2018; Time: 7:14 PM  Pain Score Disclaimer: We use the NRS-11 scale. This is a self-reported, subjective measurement of pain severity with only modest accuracy. It is used primarily to identify changes within a particular patient. It must be understood that outpatient pain scales are significantly less accurate that those used for research, where they can be applied under ideal controlled circumstances with minimal exposure to variables. In reality, the score is likely to be a combination of pain intensity and pain affect, where pain affect describes the degree of emotional arousal or changes in action readiness caused by the sensory experience of pain. Factors such as social and work situation, setting, emotional state,  anxiety levels, expectation, and prior pain experience may influence pain perception and show large inter-individual differences that may also be affected by time variables.  Patient instructions provided during this appointment: Patient Instructions  ____________________________________________________________________________________________  Medication Rules  Applies to: All patients receiving prescriptions (written or electronic).  Pharmacy of record: Pharmacy where electronic prescriptions will be sent. If written prescriptions are taken to a different pharmacy, please inform the nursing staff. The pharmacy listed in the electronic medical record should be the one where you would like electronic prescriptions to be sent.  Prescription refills: Only during scheduled appointments. Applies to both, written and electronic prescriptions.  NOTE: The following applies primarily to controlled substances (Opioid* Pain Medications).   Patient's responsibilities: 1. Pain Pills: Bring all pain pills to every appointment (except for procedure appointments). 2. Pill Bottles: Bring pills in original pharmacy bottle. Always bring newest bottle. Bring bottle, even if empty. 3. Medication refills: You are responsible for knowing and keeping track of what medications you need refilled. The day before your appointment, write a list of all prescriptions that need to be refilled. Bring that list to your appointment and give it to the admitting nurse. Prescriptions will be written only during appointments. If you forget a medication, it will not be "Called in", "Faxed", or "electronically sent". You will need to get another appointment to get these prescribed. 4. Prescription Accuracy: You are responsible for carefully inspecting your prescriptions before leaving our office. Have the discharge nurse carefully go over each prescription with you, before taking them home. Make sure that your name is  accurately  spelled, that your address is correct. Check the name and dose of your medication to make sure it is accurate. Check the number of pills, and the written instructions to make sure they are clear and accurate. Make sure that you are given enough medication to last until your next medication refill appointment. 5. Taking Medication: Take medication as prescribed. Never take more pills than instructed. Never take medication more frequently than prescribed. Taking less pills or less frequently is permitted and encouraged, when it comes to controlled substances (written prescriptions).  6. Inform other Doctors: Always inform, all of your healthcare providers, of all the medications you take. 7. Pain Medication from other Providers: You are not allowed to accept any additional pain medication from any other Doctor or Healthcare provider. There are two exceptions to this rule. (see below) In the event that you require additional pain medication, you are responsible for notifying us, as stated below. 8. Medication Agreement: You are responsible for carefully reading and following our Medication Agreement. This must be signed before receiving any prescriptions from our practice. Safely store a copy of your signed Agreement. Violations to the Agreement will result in no further prescriptions. (Additional copies of our Medication Agreement are available upon request.) 9. Laws, Rules, & Regulations: All patients are expected to follow all Federal and [employer], [employer], Rules, [employer]. Ignorance of the Laws does not constitute a valid excuse. The use of any illegal substances is prohibited. 10. Adopted CDC guidelines & recommendations: Target dosing levels will be at or below 60 MME/day. Use of benzodiazepines** is not recommended.  Exceptions: There are only two exceptions to the rule of not receiving pain medications from other Healthcare Providers. 1. Exception #1 (Emergencies): In the event of an emergency  (i.e.: accident requiring emergency care), you are allowed to receive additional pain medication. However, you are responsible for: As soon as you are able, call our office [phone], at any time of the day or night, and leave a message stating your name, the date and nature of the emergency, and the name and dose of the medication prescribed. In the event that your call is answered by a member of our staff, make sure to document and save the date, time, and the name of the person that took your information.  2. Exception #2 (Planned Surgery): In the event that you are scheduled by another doctor or dentist to have any type of surgery or procedure, you are allowed (for a period no longer than 30 days), to receive additional pain medication, for the acute post-op pain. However, in this case, you are responsible for picking up a copy of our "Post-op Pain Management for Surgeons" handout, and giving it to your surgeon or dentist. This document is available at our office, and does not require an appointment to obtain it. Simply go to our office during business hours (Monday-Thursday from 8:00 AM to 4:00 PM) (Friday 8:00 AM to 12:00 Noon) or if you have a scheduled appointment with [place], prior to your surgery, and ask for it by name. In addition, you will need to provide [place] with your name, name of your surgeon, type of surgery, and date of procedure or surgery.  *Opioid medications include: morphine, codeine, oxycodone, oxymorphone, hydrocodone, hydromorphone, meperidine, tramadol, tapentadol, buprenorphine, fentanyl, methadone. **Benzodiazepine medications include: diazepam (Valium), alprazolam (Xanax), clonazepam (Klonopine), lorazepam (Ativan), clorazepate (Tranxene), chlordiazepoxide (Librium), estazolam (Prosom), oxazepam (Serax), temazepam (Restoril), triazolam (Halcion) (Last updated: 09/03/2017) ____________________________________________________________________________________________

## 2018-01-06 ENCOUNTER — Other Ambulatory Visit: Payer: Self-pay | Admitting: Family Medicine

## 2018-01-06 DIAGNOSIS — G8929 Other chronic pain: Secondary | ICD-10-CM

## 2018-01-06 DIAGNOSIS — M545 Low back pain, unspecified: Secondary | ICD-10-CM

## 2018-01-06 DIAGNOSIS — N951 Menopausal and female climacteric states: Secondary | ICD-10-CM

## 2018-01-06 DIAGNOSIS — F321 Major depressive disorder, single episode, moderate: Secondary | ICD-10-CM

## 2018-01-06 NOTE — Telephone Encounter (Signed)
Refill request for general medication: Cymbalta 60 mg  Last office visit: 11/18/2017  Last physical exam: None indicated  Follow-ups on file. 02/19/2018

## 2018-01-10 LAB — TOXASSURE SELECT 13 (MW), URINE

## 2018-02-03 ENCOUNTER — Other Ambulatory Visit: Payer: Self-pay | Admitting: Family Medicine

## 2018-02-03 DIAGNOSIS — M5126 Other intervertebral disc displacement, lumbar region: Secondary | ICD-10-CM

## 2018-02-03 DIAGNOSIS — M5136 Other intervertebral disc degeneration, lumbar region: Secondary | ICD-10-CM

## 2018-02-03 DIAGNOSIS — F419 Anxiety disorder, unspecified: Secondary | ICD-10-CM

## 2018-02-05 ENCOUNTER — Other Ambulatory Visit: Payer: Self-pay | Admitting: Family Medicine

## 2018-02-05 DIAGNOSIS — N951 Menopausal and female climacteric states: Secondary | ICD-10-CM

## 2018-02-19 ENCOUNTER — Ambulatory Visit: Payer: Managed Care, Other (non HMO) | Admitting: Family Medicine

## 2018-02-19 ENCOUNTER — Encounter: Payer: Self-pay | Admitting: Family Medicine

## 2018-02-19 VITALS — BP 128/82 | HR 97 | Temp 98.6°F | Resp 14 | Ht 64.0 in | Wt 126.5 lb

## 2018-02-19 DIAGNOSIS — N951 Menopausal and female climacteric states: Secondary | ICD-10-CM

## 2018-02-19 DIAGNOSIS — G4700 Insomnia, unspecified: Secondary | ICD-10-CM | POA: Diagnosis not present

## 2018-02-19 DIAGNOSIS — M0600 Rheumatoid arthritis without rheumatoid factor, unspecified site: Secondary | ICD-10-CM

## 2018-02-19 DIAGNOSIS — Z1231 Encounter for screening mammogram for malignant neoplasm of breast: Secondary | ICD-10-CM | POA: Diagnosis not present

## 2018-02-19 DIAGNOSIS — M545 Low back pain: Secondary | ICD-10-CM

## 2018-02-19 DIAGNOSIS — M358 Other specified systemic involvement of connective tissue: Secondary | ICD-10-CM

## 2018-02-19 DIAGNOSIS — G8929 Other chronic pain: Secondary | ICD-10-CM

## 2018-02-19 DIAGNOSIS — Z1239 Encounter for other screening for malignant neoplasm of breast: Secondary | ICD-10-CM

## 2018-02-19 DIAGNOSIS — F321 Major depressive disorder, single episode, moderate: Secondary | ICD-10-CM | POA: Diagnosis not present

## 2018-02-19 DIAGNOSIS — L940 Localized scleroderma [morphea]: Secondary | ICD-10-CM

## 2018-02-19 DIAGNOSIS — R439 Unspecified disturbances of smell and taste: Secondary | ICD-10-CM

## 2018-02-19 MED ORDER — PREGABALIN 50 MG PO CAPS: 50.0000 mg | ORAL_CAPSULE | Freq: Three times a day (TID) | ORAL | 0 refills | Status: DC

## 2018-02-19 MED ORDER — CLONIDINE HCL 0.1 MG PO TABS: 0.1000 mg | ORAL_TABLET | Freq: Two times a day (BID) | ORAL | 0 refills | Status: DC

## 2018-02-19 MED ORDER — SERTRALINE HCL 50 MG PO TABS: 50.0000 mg | ORAL_TABLET | Freq: Every day | ORAL | 0 refills | Status: DC

## 2018-02-19 MED ORDER — ZOLPIDEM TARTRATE ER 12.5 MG PO TBCR: 12.5000 mg | EXTENDED_RELEASE_TABLET | Freq: Every day | ORAL | 0 refills | Status: DC

## 2018-02-19 NOTE — Progress Notes (Addendum)
Name: Candice Guzman   MRN: [id number]    DOB: [birth year]   Date:02/19/2018       Progress Note  Subjective  Chief Complaint  Chief Complaint  Patient presents with  . Follow-up    3 mth f/u  . RA  . Insomnia  . Depression  . Menopause  . Back Pain    HPI  Seronegative Rheumatoid Arthritis: She has been off Simponi for over one year because of insurance no longer covers medication. She is back on Humiraand since May 2018 off methotrexate because of hair loss. She is still under the care of Dr. Gavin Potters. Still has daily pain on different joints, currently worse on left hand with some synovitis.   Insomnia: able to sleep with Ambien CR , able to fall and stay asleep with medication. Unchanged   Major Depression: she tried Effexor and Duloxetine, but it did not seem to improve depression symptoms. She states mood is affected by constant pain and also the hot flashes and the fact that she feels like she smells. She denies suicidal thoughts or ideation. Feels tired all the time.   Menopause: she is now on Clonidine she is also using a topical drysol on axillar - prescribed by Dermatologist. She still feels hot but no longer sweaty since she started Drysol   Back pain: she states she has a constant lower back tightness ,she saw Dr. Idalia Needle for back injections for DDD but insurance denied any more coverage. She states having a flare at this time. Pain right now is 4-5/10, but goes up to 8 when standing. No radiculitis No bowel or bladder incontinence.   Abnormal sense of taste and smell: saw by ENT and advised to have MRI, she states she is worried about cost. She states that since the cooler weather sense of smell not as intense. Unchanged    Patient Active Problem List   Diagnosis Date Noted  . Long term prescription benzodiazepine use 09/21/2017  . Chronic lower extremity pain (Secondary Area of Pain) (Bilateral) (R>L) 09/21/2017  . Chronic low back pain (Primary Area of  Pain) (Bilateral) (R>L) 09/21/2017  . Pharmacologic therapy 09/21/2017  . Problems influencing health status 09/21/2017  . Spondylosis without myelopathy or radiculopathy, lumbosacral region 09/21/2017  . Chronic hip pain (Bilateral) (R>L) 09/21/2017  . Chronic shoulder pain (Bilateral) (L>R) 09/21/2017  . Grade 1 Anterolisthesis of L5 over S1 & L3 over L4 09/21/2017  . Grade 1 Retrolisthesis L1 over L2 & L2 over L3 09/21/2017  . DDD (degenerative disc disease), lumbar 09/21/2017  . 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 Lumbar facet joint syndrome (Bilateral) (R>L) 09/21/2017  . Chronic musculoskeletal pain 09/21/2017  . Neurogenic pain 09/21/2017  . Lumbar foraminal stenosis (L5-S1) (Bilateral) 09/21/2017  . Lumbar lateral recess stenosis (Bilateral) (B: L4-5) (L>R: L3-4) 09/21/2017  . Lumbar facet hypertrophy (Bilateral: L3-4, L4-5, and L5-S1) 09/21/2017  . Chronic pain syndrome 07/08/2017  . Chronic knee pain [facility] Area of Pain) (Bilateral) 07/08/2017  . Chronic wrist pain (Fourth Area of Pain) (Bilateral) 07/08/2017  . Disorder of skeletal system 07/08/2017  . Other long term (current) drug therapy 07/08/2017  . Long term current use of opiate analgesic 07/08/2017  . Other specified health status 07/08/2017  . Chronic pain of multiple joints 05/20/2017  . Insomnia, persistent 12/18/2014  . Depression, major, single episode, moderate (HCC) 12/18/2014  . Menopausal symptom 12/18/2014  . Vitamin D deficiency 12/18/2014  . Inflammatory arthritis (HCC) 02/23/2014  .  Lumbosacral radiculitis 12/12/2013  . Bulge of lumbar disc without myelopathy 06/18/2011    Past Surgical History:  Procedure Laterality Date  . BREAST BIOPSY Left 2010   neg  . CESAREAN SECTION    . FINGER ARTHROPLASTY Left 07/24/2017   Thumb  . GANGLION CYST EXCISION    . METACARPOPHALANGEAL JOINT ARTHROPLASTY Right 06/06/2016   Dr. Dayna Barker     Family History  Problem  Relation Age of Onset  . Emphysema Mother   . Cancer Father 60       lung  . Cancer Brother        tongue    Social History   Socioeconomic History  . Marital status: Divorced    Spouse name: Not on file  . Number of children: 1  . Years of education: Not on file  . Highest education level: High school graduate  Occupational History  . Not on file  Social Needs  . Financial resource strain: Somewhat hard  . Food insecurity:    Worry: Never true    Inability: Never true  . Transportation needs:    Medical: No    Non-medical: No  Tobacco Use  . Smoking status: Never Smoker  . Smokeless tobacco: Never Used  Substance and Sexual Activity  . Alcohol use: No    Alcohol/week: 0.0 standard drinks  . Drug use: No  . Sexual activity: Yes    Partners: Male  Lifestyle  . Physical activity:    Days per week: 0 days    Minutes per session: 0 min  . Stress: Very much  Relationships  . Social connections:    Talks on phone: More than three times a week    Gets together: Never    Attends religious service: Never    Active member of club or organization: No    Attends meetings of clubs or organizations: Never    Relationship status: Divorced  . Intimate partner violence:    Fear of current or ex partner: No    Emotionally abused: No    Physically abused: No    Forced sexual activity: No  Other Topics Concern  . Not on file  Social History Narrative  . Not on file     Current Outpatient Medications:  .  ALPRAZolam (XANAX) 0.5 MG tablet, TAKE 1 TABLET BY MOUTH EVERY NIGHT AT BEDTIME AS NEEDED, Disp: 30 tablet, Rfl: 0 .  cloNIDine (CATAPRES) 0.1 MG tablet, Take 1 tablet (0.1 mg total) by mouth 2 (two) times daily., Disp: 180 tablet, Rfl: 0 .  estradiol (ESTRACE) 0.1 MG/GM vaginal cream, Place 1 Applicatorful vaginally at bedtime. Insert 1g nightly for 1 wk, then 1 g once weekly as maintenace, Disp: 42.5 g, Rfl: 1 .  HUMIRA PEN 40 MG/0.8ML PNKT, Inject 40 mg into the skin 2  (two) times a week., Disp: , Rfl: 3 .  tiZANidine (ZANAFLEX) 4 MG tablet, TAKE 1 TABLET BY MOUTH  EVERY 6 HOURS AS NEEDED FOR MUSCLE SPASM(S), Disp: 120 tablet, Rfl: 0 .  [START ON 03/20/2018] traMADol (ULTRAM) 50 MG tablet, Take 1 tablet (50 mg total) by mouth every 12 (twelve) hours as needed for severe pain., Disp: 60 tablet, Rfl: 2 .  zolpidem (AMBIEN CR) 12.5 MG CR tablet, Take 1 tablet (12.5 mg total) by mouth at bedtime., Disp: 90 tablet, Rfl: 0 .  aluminum chloride (DRYSOL) 20 % external solution, Drysol Dab-O-Matic 20 % topical solution, Disp: , Rfl:   Allergies  Allergen Reactions  . Codeine  Itching  . Methotrexate Nausea Only     ROS  Constitutional: Negative for fever or weight change.  Respiratory: Negative for cough and shortness of breath.   Cardiovascular: Negative for chest pain or palpitations.  Gastrointestinal: Negative for abdominal pain, no bowel changes.  Musculoskeletal: Negative for gait problem , but has noticed joint swelling on her hand.  Skin: Negative for rash.  Neurological: Negative for dizziness or headache.  No other specific complaints in a complete review of systems (except as listed in HPI above).  Objective  Vitals:   02/19/18 1508  BP: 128/82  Pulse: 97  Resp: 14  Temp: 98.6 F (37 C)  TempSrc: Oral  SpO2: 98%  Weight: 126 lb 8 oz (57.4 kg)  Height: 5\' 4"  (1.626 m)    Body mass index is 21.71 kg/m.  Physical Exam  Constitutional: Patient appears well-developed and well-nourished. No distress.  HEENT: head atraumatic, normocephalic, pupils equal and reactive to light, neck supple, throat within normal limits Cardiovascular: Normal rate, regular rhythm and normal heart sounds.  No murmur heard. No BLE edema. Pulmonary/Chest: Effort normal and breath sounds normal. No respiratory distress. Abdominal: Soft.  There is no tenderness. Muscular skeletal: synovitis on right index finger and mcp joing  Psychiatric: Patient has a normal  mood and affect. behavior is normal. Judgment and thought content normal.  Recent Results (from the past 2160 hour(s))  ToxASSURE Select 13 (MW), Urine     Status: None   Collection Time: 01/05/18  2:37 PM  Result Value Ref Range   Summary FINAL     Comment: ==================================================================== TOXASSURE SELECT 13 (MW) ==================================================================== Test                             Result       Flag       Units Drug Present and Declared for Prescription Verification   Tramadol                       2773         EXPECTED   ng/mg creat   O-Desmethyltramadol            6615         EXPECTED   ng/mg creat   N-Desmethyltramadol            3080         EXPECTED   ng/mg creat    Source of tramadol is a prescription medication.    O-desmethyltramadol and N-desmethyltramadol are expected    metabolites of tramadol. Drug Absent but Declared for Prescription Verification   Alprazolam                     Not Detected UNEXPECTED ng/mg creat ==================================================================== Test                      Result    Flag   Units      Ref Range   Creatinine              40               mg/dL      >=28 ======================================= ============================= Declared Medications:  The flagging and interpretation on this report are based on the  following declared medications.  Unexpected results may arise from  inaccuracies in the declared medications.  **Note: The testing scope of this  panel includes these medications:  Alprazolam (Xanax)  Tramadol (Ultram)  **Note: The testing scope of this panel does not include following  reported medications:  Adalimumab (Humira)  Clonidine (Catapres)  Duloxetine (Cymbalta)  Estradiol (Estrace)  Methotrexate  Zolpidem (Ambien) ==================================================================== For clinical consultation, please call [phone]. ====================================================================      PHQ2/9: Depression screen [facility] 2/9 02/19/2018 01/05/2018 11/26/2017 11/18/2017 11/11/2017  Decreased Interest 3 0 3 3 0  Down, Depressed, Hopeless 3 0 3 3 0  PHQ - 2 Score 6 0 6 6 0  Altered sleeping 3 - 3 2 0  Tired, decreased energy 3 - 3 3 0  Change in appetite 1 - 0 1 0  Feeling bad or failure about yourself  1 - 0 1 0  Trouble concentrating 2 - 0 1 0  Moving slowly or fidgety/restless 1 - 1 1 0  Suicidal thoughts 1 - 0 1 0  PHQ-9 Score 18 - 13 16 0  Difficult doing work/chores Extremely dIfficult - Somewhat difficult Very difficult Not difficult at all  Some recent data might be hidden    Fall Risk: Fall Risk  02/19/2018 01/05/2018 11/26/2017 11/18/2017 11/11/2017  Falls in the past year? No Yes Yes Yes No  Comment - - sometimes feet go numb, pt trips going up steps sometimes or may lose balance if ground is not completely level. - -  Number falls in past yr: - 2 or more 2 or more 1 -  Injury with Fall? - No No No -  Comment - - - - -  Risk for fall due to : - Impaired balance/gait - - -  Risk for fall due to: Comment - - - - -  Follow up - - Falls prevention discussed - -     Functional Status Survey: Is the patient deaf or have difficulty hearing?: No Does the patient have difficulty seeing, even when wearing glasses/contacts?: No Does the patient have difficulty concentrating, remembering, or making decisions?: Yes Does the patient have difficulty walking or climbing stairs?: No Does the patient have difficulty dressing or bathing?: No Does the patient have difficulty doing errands alone such as visiting a doctor's office or shopping?: No    Assessment & Plan  1. Depression, major, single episode, moderate (HCC)  She is willing to try Zoloft, take one daily  - sertraline (ZOLOFT) 50 MG tablet; Take 1 tablet (50 mg total) by mouth daily.  Dispense: 90 tablet; Refill: 0  2. Breast cancer  screening  - MM DIGITAL SCREENING BILATERAL; Future  3. Menopausal symptom  - cloNIDine (CATAPRES) 0.1 MG tablet; Take 1 tablet (0.1 mg total) by mouth 2 (two) times daily.  Dispense: 180 tablet; Refill: 0  4. Insomnia, persistent  - zolpidem (AMBIEN CR) 12.5 MG CR tablet; Take 1 tablet (12.5 mg total) by mouth at bedtime.  Dispense: 90 tablet; Refill: 0  5. Chronic low back pain without sciatica, unspecified back pain laterality  Mild flare, continue prn medication Discussed gabapentin and lyrica she states lyrica helped in the past, discussed resuming it to control pain We will start at 50 mg TID and titrate up if needed   6. Seronegative rheumatoid arthritis (HCC)  Continue follow up with Dr. Gavin Potters   7. Smell and taste disorder  Seen by ENT   8. Reynolds syndrome (HCC)  Stable

## 2018-04-02 ENCOUNTER — Other Ambulatory Visit: Payer: Self-pay | Admitting: Family Medicine

## 2018-04-02 DIAGNOSIS — N951 Menopausal and female climacteric states: Secondary | ICD-10-CM

## 2018-04-18 ENCOUNTER — Other Ambulatory Visit: Payer: Self-pay | Admitting: Family Medicine

## 2018-04-18 DIAGNOSIS — N951 Menopausal and female climacteric states: Secondary | ICD-10-CM

## 2018-05-12 ENCOUNTER — Other Ambulatory Visit: Payer: Self-pay | Admitting: Family Medicine

## 2018-05-12 NOTE — Telephone Encounter (Signed)
Copied from CRM [id number]. Topic: General - Other >> May 12, 2018  3:18 PM Leafy Ro wrote: Reason for CRM: pt is calling and she is still taking generic cymbalta 60 mg. Pt has an appt 05-26-18. Pt needs a refill on generic cymbalta.  [facility] in [address]

## 2018-05-13 MED ORDER — DULOXETINE HCL 60 MG PO CPEP
60.0000 mg | ORAL_CAPSULE | Freq: Every day | ORAL | 1 refills | Status: DC
Start: 2018-05-13 — End: 2018-11-26

## 2018-05-13 NOTE — Telephone Encounter (Signed)
Sent refill of duloxetine

## 2018-05-13 NOTE — Telephone Encounter (Signed)
Patient is not taking Zoloft due to it did not agree with her.

## 2018-05-13 NOTE — Telephone Encounter (Signed)
Her last active medication was zoloft. Please verify she should not be taking both

## 2018-05-24 ENCOUNTER — Ambulatory Visit: Payer: Managed Care, Other (non HMO) | Admitting: Family Medicine
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
## 2018-05-26 ENCOUNTER — Ambulatory Visit: Payer: Managed Care, Other (non HMO) | Admitting: Pain Medicine

## 2018-05-26 ENCOUNTER — Other Ambulatory Visit: Payer: Self-pay

## 2018-05-26 ENCOUNTER — Ambulatory Visit
Admission: RE | Admit: 2018-05-26 | Discharge: 2018-05-26 | Disposition: A | Discharge status: Home / Self Care | Payer: Managed Care, Other (non HMO) | Source: Ambulatory Visit | Attending: Pain Medicine | Admitting: Pain Medicine

## 2018-05-26 ENCOUNTER — Ambulatory Visit: Payer: Managed Care, Other (non HMO) | Admitting: Family Medicine

## 2018-05-26 ENCOUNTER — Encounter: Payer: Self-pay | Admitting: Family Medicine

## 2018-05-26 ENCOUNTER — Encounter: Payer: Self-pay | Admitting: Pain Medicine

## 2018-05-26 VITALS — BP 148/81 | HR 86 | Temp 98.2°F | Ht 64.0 in | Wt 130.0 lb

## 2018-05-26 VITALS — BP 118/68 | HR 81 | Temp 98.2°F | Resp 16 | Ht 64.0 in | Wt 126.9 lb

## 2018-05-26 DIAGNOSIS — Z791 Long term (current) use of non-steroidal anti-inflammatories (NSAID): Secondary | ICD-10-CM | POA: Insufficient documentation

## 2018-05-26 DIAGNOSIS — Z79891 Long term (current) use of opiate analgesic: Secondary | ICD-10-CM

## 2018-05-26 DIAGNOSIS — M5116 Intervertebral disc disorders with radiculopathy, lumbar region: Secondary | ICD-10-CM

## 2018-05-26 DIAGNOSIS — G8929 Other chronic pain: Secondary | ICD-10-CM | POA: Insufficient documentation

## 2018-05-26 DIAGNOSIS — M47897 Other spondylosis, lumbosacral region: Secondary | ICD-10-CM | POA: Insufficient documentation

## 2018-05-26 DIAGNOSIS — F321 Major depressive disorder, single episode, moderate: Secondary | ICD-10-CM | POA: Insufficient documentation

## 2018-05-26 DIAGNOSIS — M5136 Other intervertebral disc degeneration, lumbar region: Secondary | ICD-10-CM | POA: Insufficient documentation

## 2018-05-26 DIAGNOSIS — M25552 Pain in left hip: Secondary | ICD-10-CM | POA: Insufficient documentation

## 2018-05-26 DIAGNOSIS — M25532 Pain in left wrist: Secondary | ICD-10-CM

## 2018-05-26 DIAGNOSIS — M25512 Pain in left shoulder: Secondary | ICD-10-CM

## 2018-05-26 DIAGNOSIS — N951 Menopausal and female climacteric states: Secondary | ICD-10-CM

## 2018-05-26 DIAGNOSIS — Z23 Encounter for immunization: Secondary | ICD-10-CM

## 2018-05-26 DIAGNOSIS — M358 Other specified systemic involvement of connective tissue: Secondary | ICD-10-CM

## 2018-05-26 DIAGNOSIS — M25562 Pain in left knee: Secondary | ICD-10-CM

## 2018-05-26 DIAGNOSIS — M79676 Pain in unspecified toe(s): Secondary | ICD-10-CM

## 2018-05-26 DIAGNOSIS — M25551 Pain in right hip: Secondary | ICD-10-CM | POA: Insufficient documentation

## 2018-05-26 DIAGNOSIS — M25531 Pain in right wrist: Secondary | ICD-10-CM | POA: Diagnosis not present

## 2018-05-26 DIAGNOSIS — M25561 Pain in right knee: Secondary | ICD-10-CM | POA: Diagnosis not present

## 2018-05-26 DIAGNOSIS — M25511 Pain in right shoulder: Secondary | ICD-10-CM | POA: Insufficient documentation

## 2018-05-26 DIAGNOSIS — G4700 Insomnia, unspecified: Secondary | ICD-10-CM | POA: Insufficient documentation

## 2018-05-26 DIAGNOSIS — M545 Low back pain: Secondary | ICD-10-CM

## 2018-05-26 DIAGNOSIS — M7918 Myalgia, other site: Secondary | ICD-10-CM | POA: Insufficient documentation

## 2018-05-26 DIAGNOSIS — E559 Vitamin D deficiency, unspecified: Secondary | ICD-10-CM | POA: Insufficient documentation

## 2018-05-26 DIAGNOSIS — M79604 Pain in right leg: Secondary | ICD-10-CM

## 2018-05-26 DIAGNOSIS — G894 Chronic pain syndrome: Secondary | ICD-10-CM | POA: Insufficient documentation

## 2018-05-26 DIAGNOSIS — M79641 Pain in right hand: Secondary | ICD-10-CM | POA: Insufficient documentation

## 2018-05-26 DIAGNOSIS — Z5181 Encounter for therapeutic drug level monitoring: Secondary | ICD-10-CM | POA: Insufficient documentation

## 2018-05-26 DIAGNOSIS — M8938 Hypertrophy of bone, other site: Secondary | ICD-10-CM | POA: Insufficient documentation

## 2018-05-26 DIAGNOSIS — M5441 Lumbago with sciatica, right side: Secondary | ICD-10-CM

## 2018-05-26 DIAGNOSIS — M069 Rheumatoid arthritis, unspecified: Secondary | ICD-10-CM | POA: Insufficient documentation

## 2018-05-26 DIAGNOSIS — Z79899 Other long term (current) drug therapy: Secondary | ICD-10-CM | POA: Insufficient documentation

## 2018-05-26 DIAGNOSIS — M5442 Lumbago with sciatica, left side: Secondary | ICD-10-CM

## 2018-05-26 DIAGNOSIS — M48061 Spinal stenosis, lumbar region without neurogenic claudication: Secondary | ICD-10-CM

## 2018-05-26 DIAGNOSIS — M170 Bilateral primary osteoarthritis of knee: Secondary | ICD-10-CM | POA: Insufficient documentation

## 2018-05-26 DIAGNOSIS — M79605 Pain in left leg: Secondary | ICD-10-CM | POA: Insufficient documentation

## 2018-05-26 DIAGNOSIS — M47817 Spondylosis without myelopathy or radiculopathy, lumbosacral region: Secondary | ICD-10-CM

## 2018-05-26 DIAGNOSIS — M5126 Other intervertebral disc displacement, lumbar region: Secondary | ICD-10-CM | POA: Insufficient documentation

## 2018-05-26 DIAGNOSIS — M4316 Spondylolisthesis, lumbar region: Secondary | ICD-10-CM

## 2018-05-26 DIAGNOSIS — M5416 Radiculopathy, lumbar region: Secondary | ICD-10-CM

## 2018-05-26 DIAGNOSIS — M4310 Spondylolisthesis, site unspecified: Secondary | ICD-10-CM

## 2018-05-26 DIAGNOSIS — L940 Localized scleroderma [morphea]: Secondary | ICD-10-CM

## 2018-05-26 DIAGNOSIS — M47816 Spondylosis without myelopathy or radiculopathy, lumbar region: Secondary | ICD-10-CM

## 2018-05-26 DIAGNOSIS — Z885 Allergy status to narcotic agent status: Secondary | ICD-10-CM

## 2018-05-26 DIAGNOSIS — M0600 Rheumatoid arthritis without rheumatoid factor, unspecified site: Secondary | ICD-10-CM

## 2018-05-26 MED ORDER — QUETIAPINE FUMARATE 25 MG PO TABS: 25.0000 mg | ORAL_TABLET | Freq: Every day | ORAL | 0 refills | Status: DC

## 2018-05-26 MED ORDER — CLONIDINE HCL 0.1 MG PO TABS: 0.1000 mg | ORAL_TABLET | Freq: Two times a day (BID) | ORAL | 0 refills | Status: DC

## 2018-05-26 NOTE — Patient Instructions (Addendum)
__________You have been instructed to get xrays done in the medical mall today.  You have been referred to PT and for an MRI.  Pre procedure instructions givenPreparing for Procedure with Sedation Instructions: . Oral Intake: Do not eat or drink anything for at least 8 hours prior to your procedure. . Transportation: Public transportation is not allowed. Bring an adult driver. The driver must be physically present in our waiting room before any procedure can be started. Marland Kitchen Physical Assistance: Bring an adult capable of physically assisting you, in the event you need help. . Blood Pressure Medicine: Take your blood pressure medicine with a sip of water the morning of the procedure. . Insulin: Take only  of your normal insulin dose. . Preventing infections: Shower with an antibacterial soap the morning of your procedure. . Build-up your immune system: Take 1000 mg of Vitamin C with every meal (3 times a day) the day prior to your procedure. . Pregnancy: If you are pregnant, call and cancel the procedure. . Sickness: If you have a cold, fever, or any active infections, call and cancel the procedure. . Arrival: You must be in the facility at least 30 minutes prior to your scheduled procedure. . Children: Do not bring children with you. . Dress appropriately: Bring dark clothing that you would not mind if they get stained. . Valuables: Do not bring any jewelry or valuables. Procedure appointments are reserved for interventional treatments only. Marland Kitchen No Prescription Refills. . No medication changes will be discussed during procedure appointments. No disability issues will be discussed.Selective Nerve Root Block Patient Information  Description: Specific nerve roots exit the spinal canal and these nerves can be compressed and inflamed by a bulging disc and bone spurs.  By injecting steroids on the nerve root, we can potentially decrease the inflammation surrounding these nerves, which often leads to  decreased pain.  Also, by injecting local anesthesia on the nerve root, this can provide [place] helpful information to give to your referring doctor if it decreases your pain.  Selective nerve root blocks can be done along the spine from the neck to the low back depending on the location of your pain.   After numbing the skin with local anesthesia, a small needle is passed to the nerve root and the position of the needle is verified using x-ray pictures.  After the needle is in correct position, we then deposit the medication.  You may experience a pressure sensation while this is being done.  The entire block usually lasts less than 15 minutes.  Conditions that may be treated with selective nerve root blocks:  Low back and leg pain  Spinal stenosis  Diagnostic block prior to potential surgery  Neck and arm pain  Post laminectomy syndrome  Preparation for the injection:  1. Do not eat any solid food or dairy products within 8 hours of your appointment. 2. You may drink clear liquids up to 3 hours before an appointment.  Clear liquids include water, black coffee, juice or soda.  No milk or cream please. 3. You may take your regular medications, including pain medications, with a sip of water before your appointment.  Diabetics should hold regular insulin (if taken separately) and take 1/2 normal NPH dose the morning of the procedure.  Carry some sugar containing items with you to your appointment. 4. A driver must accompany you and be prepared to drive you home after your procedure. 5. Bring all your current medications with you. 6. An IV may  be inserted and sedation may be given at the discretion of the physician. 7. A blood pressure cuff, EKG, and other monitors will often be applied during the procedure.  Some patients may need to have extra oxygen administered for a short period. 8. You will be asked to provide medical information, including allergies, prior to the procedure.  We must know  immediately if you are taking blood  Thinners (like Coumadin) or if you are allergic to IV iodine contrast (dye).  Possible side-effects: All are usually temporary  Bleeding from needle site  Light headedness  Numbness and tingling  Decreased blood pressure  Weakness in arms/legs  Pressure sensation in back/neck  Pain at injection site (several days)  Possible complications: All are extremely rare  Infection  Nerve injury  Spinal headache (a headache wore with upright position)  Call if you experience:  Fever/chills associated with headache or increased back/neck pain  Headache worsened by an upright position  New onset weakness or numbness of an extremity below the injection site  Hives or difficulty breathing (go to the emergency room)  Inflammation or drainage at the injection site(s)  Severe back/neck pain greater than usual  New symptoms which are concerning to you  Please note:  Although the local anesthetic injected can often make your back or neck feel good for several hours after the injection the pain will likely return.  It takes 3-5 days for steroids to work on the nerve root. You may not notice any pain relief for at least one week.  If effective, we will often do a series of 3 injections spaced 3-6 weeks apart to maximally decrease your pain.    If you have any questions, please call [phone] [facility]__________________________________________________________________________________  Preparing for Procedure with Sedation  Instructions: . Oral Intake: Do not eat or drink anything for at least 8 hours prior to your procedure. . Transportation: Public transportation is not allowed. Bring an adult driver. The driver must be physically present in our waiting room before any procedure can be started. Marland Kitchen Physical Assistance: Bring an adult physically capable of assisting you, in the event you need help. This  adult should keep you company at home for at least 6 hours after the procedure. . Blood Pressure Medicine: Take your blood pressure medicine with a sip of water the morning of the procedure. . Blood thinners: Notify our staff if you are taking any blood thinners. Depending on which one you take, there will be specific instructions on how and when to stop it. . Diabetics on insulin: Notify the staff so that you can be scheduled 1st case in the morning. If your diabetes requires high dose insulin, take only  of your normal insulin dose the morning of the procedure and notify the staff that you have done so. . Preventing infections: Shower with an antibacterial soap the morning of your procedure. . Build-up your immune system: Take 1000 mg of Vitamin C with every meal (3 times a day) the day prior to your procedure. Marland Kitchen Antibiotics: Inform the staff if you have a condition or reason that requires you to take antibiotics before dental procedures. . Pregnancy: If you are pregnant, call and cancel the procedure. . Sickness: If you have a cold, fever, or any active infections, call and cancel the procedure. . Arrival: You must be in the facility at least 30 minutes prior to your scheduled procedure. . Children: Do not bring children with you. Frutoso Schatz appropriately:  Bring dark clothing that you would not mind if they get stained. . Valuables: Do not bring any jewelry or valuables.  Procedure appointments are reserved for interventional treatments only. Marland Kitchen No Prescription Refills. . No medication changes will be discussed during procedure appointments. . No disability issues will be discussed.  Reasons to call and reschedule or cancel your procedure: (Following these recommendations will minimize the risk of a serious complication.) . Surgeries: Avoid having procedures within 2 weeks of any surgery. (Avoid for 2 weeks before or after any surgery). . Flu Shots: Avoid having procedures within 2 weeks of a  flu shots or . (Avoid for 2 weeks before or after immunizations). . Barium: Avoid having a procedure within 7-10 days after having had a radiological study involving the use of radiological contrast. (Myelograms, Barium swallow or enema study). . Heart attacks: Avoid any elective procedures or surgeries for the initial 6 months after a "Myocardial Infarction" (Heart Attack). . Blood thinners: It is imperative that you stop these medications before procedures. Let us know if you if you take any blood thinner.  . Infection: Avoid procedures during or within two weeks of an infection (including chest colds or gastrointestinal problems). Symptoms associated with infections include: Localized redness, fever, chills, night sweats or profuse sweating, burning sensation when voiding, cough, congestion, stuffiness, runny nose, sore throat, diarrhea, nausea, vomiting, cold or Flu symptoms, recent or current infections. It is specially important if the infection is over the area that we intend to treat. Marland Kitchen Heart and lung problems: Symptoms that may suggest an active cardiopulmonary problem include: cough, chest pain, breathing difficulties or shortness of breath, dizziness, ankle swelling, uncontrolled high or unusually low blood pressure, and/or palpitations. If you are experiencing any of these symptoms, cancel your procedure and contact your primary care physician for an evaluation.  Remember:  Regular Business hours are:  Monday to Thursday 8:00 AM to 4:00 PM  Provider's Schedule: Delano Metz, MD:  Procedure days: Tuesday and Thursday 7:30 AM to 4:00 PM  Edward Jolly, MD:  Procedure days: Monday and Wednesday 7:30 AM to 4:00 PM ____________________________________________________________________________________________

## 2018-05-26 NOTE — Progress Notes (Signed)
Name: Candice Guzman   MRN: [id number]    DOB: [birth year]   Date:05/26/2018       Progress Note  Subjective  Chief Complaint  Chief Complaint  Patient presents with  . Medication Refill  . Depression  . Insomnia  . Menopause  . Back Pain    HPI  SeronegativeRheumatoidArthritis: She has been off Simponi forover one [employer] stopped paying for it. She is back on Humiraand since May 2018 off methotrexate because of hair loss. She is still under the care of Dr. Gavin Potters. Still has daily pain on different joints, currently worse on right shoulder, she states it was severe over the weekend with large effusion and redness, better today. She used iced and heat.  Insomnia: able to sleep with Ambien CR , able to fall and stay asleep with medication.  She is aware that not approved when she gets older and for females is the lower dose but she cannot sleep with lower dose.   Major Depression: she tried Effexor and Duloxetine, but it did not seem to improve depression symptoms. She states mood is affected by constant pain and also the hot flashes and the fact that she feels like she smells. She denies suicidal thoughts or ideation. Always tired, we tried switching to Sertraline on her last visit but she was unable to tolerate medication and it caused her to feel angry, she called back and resumed Duloxetine , discussed adding seroquel in place of ambien and she is willing to try it now.   Menopause: she is now on Clonidine she is also using a topical drysol on axillar - prescribed by Dermatologist. She still feels hot but no longer sweaty since she started Drysol Stable   Back pain: she states she has a constant lower back tightness ,she saw Dr. Idalia Needle for back injections for DDD but insurance denied any more coverage. She states having a flare at this time. Pain right now is 2/10, but goes up to 8 when standing. No radiculitis No bowel or bladder incontinence. She saw Dr.  Idalia Needle this am   Abnormal sense of taste and smell: saw by ENT and advised to have MRI, she states she is worried about cost. She states that since the cooler weather sense of smell not as intense. Unchanged    Patient Active Problem List   Diagnosis Date Noted  . Chronic lumbar radicular pain (L5 dermatome) (Bilateral) (R>L) 05/26/2018  . Long term prescription benzodiazepine use 09/21/2017  . Chronic lower extremity pain (Primary Area of Pain) (Bilateral) (R>L) 09/21/2017  . Chronic low back pain (Secondary Area of Pain) (Bilateral) (R>L) 09/21/2017  . Pharmacologic therapy 09/21/2017  . Problems influencing health status 09/21/2017  . Spondylosis without myelopathy or radiculopathy, lumbosacral region 09/21/2017  . Chronic hip pain [facility] Area of Pain) (Bilateral) (R>L) 09/21/2017  . Chronic shoulder pain (Bilateral) (L>R) 09/21/2017  . Grade 1 Anterolisthesis of L5 over S1 & L3 over L4 09/21/2017  . Grade 1 Retrolisthesis L1 over L2 & L2 over L3 09/21/2017  . DDD (degenerative disc disease), lumbar 09/21/2017  . 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 Lumbar facet joint syndrome (Bilateral) (R>L) 09/21/2017  . Chronic musculoskeletal pain 09/21/2017  . Neurogenic pain 09/21/2017  . Lumbar foraminal stenosis (L5-S1) (Bilateral) 09/21/2017  . Lumbar lateral recess stenosis (Bilateral) (B: L4-5) (L>R: L3-4) 09/21/2017  . Lumbar facet hypertrophy (Bilateral: L3-4, L4-5, and L5-S1) 09/21/2017  . Chronic pain syndrome 07/08/2017  . Chronic  knee pain (Fourth Area of Pain) (Bilateral) 07/08/2017  . Chronic wrist pain (Fifth Area of Pain) (Bilateral) 07/08/2017  . Disorder of skeletal system 07/08/2017  . Other long term (current) drug therapy 07/08/2017  . Long term current use of opiate analgesic 07/08/2017  . Other specified health status 07/08/2017  . Chronic pain of multiple joints 05/20/2017  . Insomnia, persistent 12/18/2014  . Depression,  major, single episode, moderate (HCC) 12/18/2014  . Menopausal symptom 12/18/2014  . Vitamin D deficiency 12/18/2014  . Inflammatory arthritis (HCC) 02/23/2014  . Lumbosacral radiculitis 12/12/2013  . Bulge of lumbar disc without myelopathy 06/18/2011    Past Surgical History:  Procedure Laterality Date  . BREAST BIOPSY Left 2010   neg  . CESAREAN SECTION    . FINGER ARTHROPLASTY Left 07/24/2017   Thumb  . GANGLION CYST EXCISION    . METACARPOPHALANGEAL JOINT ARTHROPLASTY Right 06/06/2016   Dr. Dayna Barker     Family History  Problem Relation Age of Onset  . Emphysema Mother   . Cancer Father 60       lung  . Cancer Brother        tongue    Social History   Socioeconomic History  . Marital status: Divorced    Spouse name: Not on file  . Number of children: 1  . Years of education: Not on file  . Highest education level: High school graduate  Occupational History  . Not on file  Social Needs  . Financial resource strain: Somewhat hard  . Food insecurity:    Worry: Never true    Inability: Never true  . Transportation needs:    Medical: No    Non-medical: No  Tobacco Use  . Smoking status: Never Smoker  . Smokeless tobacco: Never Used  Substance and Sexual Activity  . Alcohol use: No    Alcohol/week: 0.0 standard drinks  . Drug use: No  . Sexual activity: Yes    Partners: Male  Lifestyle  . Physical activity:    Days per week: 0 days    Minutes per session: 0 min  . Stress: Very much  Relationships  . Social connections:    Talks on phone: More than three times a week    Gets together: Never    Attends religious service: Never    Active member of club or organization: No    Attends meetings of clubs or organizations: Never    Relationship status: Divorced  . Intimate partner violence:    Fear of current or ex partner: No    Emotionally abused: No    Physically abused: No    Forced sexual activity: No  Other Topics Concern  . Not on file  Social  History Narrative  . Not on file     Current Outpatient Medications:  .  ALPRAZolam (XANAX) 0.5 MG tablet, TAKE 1 TABLET BY MOUTH EVERY NIGHT AT BEDTIME AS NEEDED, Disp: 30 tablet, Rfl: 0 .  aluminum chloride (DRYSOL) 20 % external solution, Drysol Dab-O-Matic 20 % topical solution, Disp: , Rfl:  .  cloNIDine (CATAPRES) 0.1 MG tablet, Take 1 tablet (0.1 mg total) by mouth 2 (two) times daily., Disp: 180 tablet, Rfl: 0 .  DULoxetine (CYMBALTA) 60 MG capsule, Take 1 capsule (60 mg total) by mouth daily., Disp: 90 capsule, Rfl: 1 .  estradiol (ESTRACE) 0.1 MG/GM vaginal cream, Place 1 Applicatorful vaginally at bedtime. Insert 1g nightly for 1 wk, then 1 g once weekly as maintenace, Disp: 42.5 g,  Rfl: 1 .  HUMIRA PEN 40 MG/0.8ML PNKT, Inject 40 mg into the skin 2 (two) times a week., Disp: , Rfl: 3 .  meloxicam (MOBIC) 7.5 MG tablet, Take 7.5 mg by mouth daily., Disp: , Rfl:  .  tiZANidine (ZANAFLEX) 4 MG tablet, TAKE 1 TABLET BY MOUTH  EVERY 6 HOURS AS NEEDED FOR MUSCLE SPASM(S), Disp: 120 tablet, Rfl: 0 .  traMADol (ULTRAM) 50 MG tablet, Take 1 tablet (50 mg total) by mouth every 12 (twelve) hours as needed for severe pain., Disp: 60 tablet, Rfl: 2 .  zolpidem (AMBIEN CR) 12.5 MG CR tablet, Take 1 tablet (12.5 mg total) by mouth at bedtime., Disp: 90 tablet, Rfl: 0  Allergies  Allergen Reactions  . Codeine Itching  . Methotrexate Nausea Only    I personally reviewed active problem list, medication list, allergies, family history, social history with the patient/caregiver today.   ROS  Constitutional: Negative for fever or weight change.  Respiratory: Negative for cough and shortness of breath.   Cardiovascular: Negative for chest pain or palpitations.  Gastrointestinal: Negative for abdominal pain, no bowel changes.  Musculoskeletal: Negative for gait problem, positive for  joint swelling.  Skin: Negative for rash.  Neurological: Negative for dizziness or headache.  No other  specific complaints in a complete review of systems (except as listed in HPI above).  Objective  Vitals:   05/26/18 1023  BP: 118/68  Pulse: 81  Resp: 16  Temp: 98.2 F (36.8 C)  TempSrc: Oral  SpO2: 97%  Weight: 126 lb 14.4 oz (57.6 kg)  Height: 5\' 4"  (1.626 m)    Body mass index is 21.78 kg/m.  Physical Exam  Constitutional: Patient appears well-developed and well-nourished. No distress.  HEENT: head atraumatic, normocephalic, pupils equal and reactive to light,  neck supple, throat within normal limits Cardiovascular: Normal rate, regular rhythm and normal heart sounds.  No murmur heard. No BLE edema. Pulmonary/Chest: Effort normal and breath sounds normal. No respiratory distress. Abdominal: Soft.  There is no tenderness. Muscular skeletal: no effusions today  Psychiatric: Patient has a normal mood and affect. behavior is normal. Judgment and thought content normal.   PHQ2/9: Depression screen [facility] 2/9 05/26/2018 02/19/2018 01/05/2018 11/26/2017 11/18/2017  Decreased Interest 3 3 0 3 3  Down, Depressed, Hopeless 3 3 0 3 3  PHQ - 2 Score 6 6 0 6 6  Altered sleeping 3 3 - 3 2  Tired, decreased energy 2 3 - 3 3  Change in appetite 0 1 - 0 1  Feeling bad or failure about yourself  0 1 - 0 1  Trouble concentrating 1 2 - 0 1  Moving slowly or fidgety/restless 0 1 - 1 1  Suicidal thoughts 0 1 - 0 1  PHQ-9 Score 12 18 - 13 16  Difficult doing work/chores Somewhat difficult Extremely dIfficult - Somewhat difficult Very difficult  Some recent data might be hidden     Fall Risk: Fall Risk  05/26/2018 02/19/2018 01/05/2018 11/26/2017 11/18/2017  Falls in the past year? 0 No Yes Yes Yes  Comment - - - sometimes feet go numb, pt trips going up steps sometimes or may lose balance if ground is not completely level. -  Number falls in past yr: - - 2 or more 2 or more 1  Injury with Fall? - - No No No  Comment - - - - -  Risk for fall due to : - - Impaired balance/gait - -  Risk for  fall due to: Comment - - - - -  Follow up - - - Falls prevention discussed -     Functional Status Survey: Is the patient deaf or have difficulty hearing?: No Does the patient have difficulty seeing, even when wearing glasses/contacts?: No Does the patient have difficulty concentrating, remembering, or making decisions?: No Does the patient have difficulty walking or climbing stairs?: No Does the patient have difficulty dressing or bathing?: No Does the patient have difficulty doing errands alone such as visiting a doctor's office or shopping?: No    Assessment & Plan  1. Depression, major, single episode, moderate (HCC)  - Ambulatory referral to Psychiatry Continue Duloxetine, refer to psychiatrist since she has been depressed for many years and mediation does not seem to work, felt angry and worse on zoloft.   2. Need for immunization against influenza  - Flu Vaccine QUAD 6+ mos PF IM (Fluarix Quad PF)  3. Chronic low back pain without sciatica, unspecified back pain laterality  Continue follow up with Dr. Idalia Needle   4. Insomnia, persistent  We will try stopping Ambien, she still has 8 pills left at home and try low dose seroquel to see if she can sleep , she is aware it is a off label indications - QUEtiapine (SEROQUEL) 25 MG tablet; Take 1 tablet (25 mg total) by mouth at bedtime.  Dispense: 30 tablet; Refill: 0 - Ambulatory referral to Psychiatry  5. Seronegative rheumatoid arthritis (HCC)  Keep follow up with Dr. Gavin Potters   6. Reynolds syndrome (HCC)   7. Menopausal symptom  - cloNIDine (CATAPRES) 0.1 MG tablet; Take 1 tablet (0.1 mg total) by mouth 2 (two) times daily.  Dispense: 180 tablet; Refill: 0

## 2018-05-31 ENCOUNTER — Telehealth: Payer: Self-pay | Admitting: *Deleted

## 2018-06-07 ENCOUNTER — Encounter: Payer: Self-pay | Admitting: Nurse Practitioner

## 2018-06-07 ENCOUNTER — Ambulatory Visit: Payer: Managed Care, Other (non HMO) | Attending: Nurse Practitioner | Admitting: Nurse Practitioner

## 2018-06-07 ENCOUNTER — Other Ambulatory Visit: Payer: Self-pay

## 2018-06-07 ENCOUNTER — Ambulatory Visit: Payer: Managed Care, Other (non HMO) | Admitting: Pain Medicine

## 2018-06-07 VITALS — BP 162/86 | HR 88 | Temp 97.8°F | Ht 64.0 in | Wt 128.0 lb

## 2018-06-07 DIAGNOSIS — Z885 Allergy status to narcotic agent status: Secondary | ICD-10-CM | POA: Diagnosis not present

## 2018-06-07 DIAGNOSIS — M47817 Spondylosis without myelopathy or radiculopathy, lumbosacral region: Secondary | ICD-10-CM | POA: Insufficient documentation

## 2018-06-07 DIAGNOSIS — M5126 Other intervertebral disc displacement, lumbar region: Secondary | ICD-10-CM

## 2018-06-07 DIAGNOSIS — G4700 Insomnia, unspecified: Secondary | ICD-10-CM | POA: Diagnosis not present

## 2018-06-07 DIAGNOSIS — M5116 Intervertebral disc disorders with radiculopathy, lumbar region: Secondary | ICD-10-CM | POA: Diagnosis not present

## 2018-06-07 DIAGNOSIS — M25531 Pain in right wrist: Secondary | ICD-10-CM | POA: Insufficient documentation

## 2018-06-07 DIAGNOSIS — M48061 Spinal stenosis, lumbar region without neurogenic claudication: Secondary | ICD-10-CM | POA: Diagnosis not present

## 2018-06-07 DIAGNOSIS — Z79891 Long term (current) use of opiate analgesic: Secondary | ICD-10-CM | POA: Diagnosis not present

## 2018-06-07 DIAGNOSIS — F329 Major depressive disorder, single episode, unspecified: Secondary | ICD-10-CM | POA: Diagnosis not present

## 2018-06-07 DIAGNOSIS — E559 Vitamin D deficiency, unspecified: Secondary | ICD-10-CM | POA: Insufficient documentation

## 2018-06-07 DIAGNOSIS — M5136 Other intervertebral disc degeneration, lumbar region: Secondary | ICD-10-CM

## 2018-06-07 DIAGNOSIS — G894 Chronic pain syndrome: Secondary | ICD-10-CM | POA: Diagnosis not present

## 2018-06-07 DIAGNOSIS — M25532 Pain in left wrist: Secondary | ICD-10-CM | POA: Insufficient documentation

## 2018-06-07 DIAGNOSIS — Z79899 Other long term (current) drug therapy: Secondary | ICD-10-CM | POA: Diagnosis not present

## 2018-06-07 DIAGNOSIS — Z5181 Encounter for therapeutic drug level monitoring: Secondary | ICD-10-CM | POA: Insufficient documentation

## 2018-06-07 DIAGNOSIS — M549 Dorsalgia, unspecified: Secondary | ICD-10-CM | POA: Insufficient documentation

## 2018-06-07 DIAGNOSIS — M069 Rheumatoid arthritis, unspecified: Secondary | ICD-10-CM | POA: Diagnosis not present

## 2018-06-07 DIAGNOSIS — M25551 Pain in right hip: Secondary | ICD-10-CM

## 2018-06-07 DIAGNOSIS — M25552 Pain in left hip: Secondary | ICD-10-CM

## 2018-06-07 DIAGNOSIS — G8929 Other chronic pain: Secondary | ICD-10-CM

## 2018-06-07 MED ORDER — TRAMADOL HCL 50 MG PO TABS: 50.0000 mg | ORAL_TABLET | Freq: Two times a day (BID) | ORAL | 2 refills | Status: DC | PRN

## 2018-06-07 NOTE — Patient Instructions (Signed)
____________________________________________________________________________________________  Medication Rules  Purpose: To inform patients, and their family members, of our rules and regulations.  Applies to: All patients receiving prescriptions (written or electronic).  Pharmacy of record: Pharmacy where electronic prescriptions will be sent. If written prescriptions are taken to a different pharmacy, please inform the nursing staff. The pharmacy listed in the electronic medical record should be the one where you would like electronic prescriptions to be sent.  Electronic prescriptions: In compliance with the [place] Strengthen Opioid Misuse Prevention (STOP) Act of 2017 (Session Law 2017-74/H243), effective July 07, 2018, all controlled substances must be electronically prescribed. Calling prescriptions to the pharmacy will cease to exist.  Prescription refills: Only during scheduled appointments. Applies to all prescriptions.  NOTE: The following applies primarily to controlled substances (Opioid* Pain Medications).   Patient's responsibilities: 1. Pain Pills: Bring all pain pills to every appointment (except for procedure appointments). 2. Pill Bottles: Bring pills in original pharmacy bottle. Always bring the newest bottle. Bring bottle, even if empty. 3. Medication refills: You are responsible for knowing and keeping track of what medications you take and those you need refilled. The day before your appointment: write a list of all prescriptions that need to be refilled. The day of the appointment: give the list to the admitting nurse. Prescriptions will be written only during appointments. If you forget a medication: it will not be "Called in", "Faxed", or "electronically sent". You will need to get another appointment to get these prescribed. No early refills. Do not call asking to have your prescription filled early. 4. Prescription Accuracy: You are responsible for  carefully inspecting your prescriptions before leaving our office. Have the discharge nurse carefully go over each prescription with you, before taking them home. Make sure that your name is accurately spelled, that your address is correct. Check the name and dose of your medication to make sure it is accurate. Check the number of pills, and the written instructions to make sure they are clear and accurate. Make sure that you are given enough medication to last until your next medication refill appointment. 5. Taking Medication: Take medication as prescribed. When it comes to controlled substances, taking less pills or less frequently than prescribed is permitted and encouraged. Never take more pills than instructed. Never take medication more frequently than prescribed.  6. Inform other Doctors: Always inform, all of your healthcare providers, of all the medications you take. 7. Pain Medication from other Providers: You are not allowed to accept any additional pain medication from any other Doctor or Healthcare provider. There are two exceptions to this rule. (see below) In the event that you require additional pain medication, you are responsible for notifying us, as stated below. 8. Medication Agreement: You are responsible for carefully reading and following our Medication Agreement. This must be signed before receiving any prescriptions from our practice. Safely store a copy of your signed Agreement. Violations to the Agreement will result in no further prescriptions. (Additional copies of our Medication Agreement are available upon request.) 9. Laws, Rules, & Regulations: All patients are expected to follow all Federal and State Laws, Statutes, Rules, & Regulations. Ignorance of the Laws does not constitute a valid excuse. The use of any illegal substances is prohibited. 10. Adopted CDC guidelines & recommendations: Target dosing levels will be at or below 60 MME/day. Use of benzodiazepines** is not  recommended.  Exceptions: There are only two exceptions to the rule of not receiving pain medications from other Healthcare Providers. 1.   Exception #1 (Emergencies): In the event of an emergency (i.e.: accident requiring emergency care), you are allowed to receive additional pain medication. However, you are responsible for: As soon as you are able, call our office [phone], at any time of the day or night, and leave a message stating your name, the date and nature of the emergency, and the name and dose of the medication prescribed. In the event that your call is answered by a member of our staff, make sure to document and save the date, time, and the name of the person that took your information.  2. Exception #2 (Planned Surgery): In the event that you are scheduled by another doctor or dentist to have any type of surgery or procedure, you are allowed (for a period no longer than 30 days), to receive additional pain medication, for the acute post-op pain. However, in this case, you are responsible for picking up a copy of our "Post-op Pain Management for Surgeons" handout, and giving it to your surgeon or dentist. This document is available at our office, and does not require an appointment to obtain it. Simply go to our office during business hours (Monday-Thursday from 8:00 AM to 4:00 PM) (Friday 8:00 AM to 12:00 Noon) or if you have a scheduled appointment with us, prior to your surgery, and ask for it by name. In addition, you will need to provide us with your name, name of your surgeon, type of surgery, and date of procedure or surgery.  *Opioid medications include: morphine, codeine, oxycodone, oxymorphone, hydrocodone, hydromorphone, meperidine, tramadol, tapentadol, buprenorphine, fentanyl, methadone. **Benzodiazepine medications include: diazepam (Valium), alprazolam (Xanax), clonazepam (Klonopine), lorazepam (Ativan), clorazepate (Tranxene), chlordiazepoxide (Librium), estazolam (Prosom),  oxazepam (Serax), temazepam (Restoril), triazolam (Halcion) (Last updated: 09/03/2017) ____________________________________________________________________________________________    

## 2018-06-07 NOTE — Progress Notes (Signed)
Patient's Name: Candice Guzman  MRN: [id number]  Referring Provider: Steele Sizer, MD  DOB: 11/03/1960  PCP: Steele Sizer, MD  DOS: 06/07/2018  Note by: Vevelyn Francois NP  Service setting: Ambulatory outpatient  Specialty: Interventional Pain Management  Location: [facility] (AMB) Pain Management Facility    Patient type: Established    Primary Reason(s) for Visit: Encounter for prescription drug management. (Level of risk: moderate)  CC: Back Pain  HPI  Candice Guzman is a 57 y.o. year old, female patient, who comes today for a medication management evaluation. She has Insomnia, persistent; Bulge of lumbar disc without myelopathy; Depression, major, single episode, moderate ([place]); Lumbosacral radiculitis; Menopausal symptom; Vitamin D deficiency; Inflammatory arthritis ([place]); Chronic pain of multiple joints; Chronic pain syndrome; Chronic knee pain (Fourth Area of Pain) (Bilateral); Chronic wrist pain (Fifth Area of Pain) (Bilateral); Disorder of skeletal system; Other long term (current) drug therapy; Long term current use of opiate analgesic; Other specified health status; Long term prescription benzodiazepine use; Chronic lower extremity pain (Primary Area of Pain) (Bilateral) (R>L); Chronic low back pain (Secondary Area of Pain) (Bilateral) (R>L); Pharmacologic therapy; Problems influencing health status; Spondylosis without myelopathy or radiculopathy, lumbosacral region; Chronic hip pain [facility] Area of Pain) (Bilateral) (R>L); Chronic shoulder pain (Bilateral) (L>R); Grade 1 Anterolisthesis of L5 over S1 & L3 over L4; Grade 1 Retrolisthesis L1 over L2 & L2 over L3; DDD (degenerative disc disease), lumbar; 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 Her primarily concern today is the Back Pain  Pain Assessment: Location: Lower Back Radiating: Pain radiaties down both hip ,leg and feet Onset: More than a month ago Duration: Chronic pain Quality: Aching, Throbbing Severity: 3 /10 (subjective, self-reported pain score)  Note: Reported level is compatible with observation.                          Effect on ADL: pain is worse at night when i lay down in bed, unable to sleep at night Timing: Constant Modifying factors: laying on my side, medications, heating pad, pillow between my knees BP: (!) 162/86  HR: 88  Candice Guzman was last scheduled for an appointment on 01/05/2018 for medication management. During today's appointment we reviewed Candice Guzman chronic pain status, as well as her outpatient medication regimen.  She is waiting to have an MRI completed of her lumbar spine.  [name] that she does have some interventional therapy lined.  She is waiting for insurance approval.  She denies any new concerns today.  She only uses the tramadol as needed.  She denies any side effects.  She does continue to work full-time. The patient  reports that she does not use drugs. Her body mass index is 21.97 kg/m.  Further details on both, my assessment(s), as well as the proposed treatment plan, please see below.  Controlled Substance Pharmacotherapy Assessment REMS (Risk Evaluation and Mitigation Strategy)  Analgesic:Tramadol 50 mg1 tab PO BID (100 mg/day) MME/day:19m/day  BChauncey Fischer RN  06/07/2018  3:00 PM  Sign at close encounter Nursing Pain Medication Assessment:  Safety precautions to be maintained throughout the outpatient stay will include: orient to surroundings, keep bed in low position, maintain call  bell within reach at all times, provide assistance with transfer out of bed and ambulation.  Medication  Inspection Compliance: Pill count conducted under aseptic conditions, in front of the patient. Neither the pills nor the bottle was removed from the patient's sight at any time. Once count was completed pills were immediately returned to the patient in their original bottle.  Medication: Tramadol (Ultram) Pill/Patch Count: 16 of 60 pills remain Pill/Patch Appearance: Markings consistent with prescribed medication Bottle Appearance: Standard pharmacy container. Clearly labeled. Filled Date: 63 / 25 / 2019 Last Medication intake:  Yesterday   Pharmacokinetics: Liberation and absorption (onset of action): WNL Distribution (time to peak effect): WNL Metabolism and excretion (duration of action): WNL         Pharmacodynamics: Desired effects: Analgesia: [name] reports >50% benefit. Functional ability: Patient reports that medication allows her to accomplish basic ADLs Clinically meaningful improvement in function (CMIF): Sustained CMIF goals met Perceived effectiveness: Described as relatively effective, allowing for increase in activities of daily living (ADL) Undesirable effects: Side-effects or Adverse reactions: None reported Monitoring: [place] PMP: Online review of the past 68-monthperiod conducted. Compliant with practice rules and regulations Last UDS on record: Summary  Date Value Ref Range Status  01/05/2018 FINAL  Final    Comment:    ==================================================================== TOXASSURE SELECT 13 (MW) ==================================================================== Test                             Result       Flag       Units Drug Present and Declared for Prescription Verification   Tramadol                       2773         EXPECTED   ng/mg creat   O-Desmethyltramadol            6615         EXPECTED   ng/mg creat   N-Desmethyltramadol            3080         EXPECTED   ng/mg creat    Source of tramadol is a prescription medication.     O-desmethyltramadol and N-desmethyltramadol are expected    metabolites of tramadol. Drug Absent but Declared for Prescription Verification   Alprazolam                     Not Detected UNEXPECTED ng/mg creat ==================================================================== Test                      Result    Flag   Units      Ref Range   Creatinine              40               mg/dL      >=20 ==================================================================== Declared Medications:  The flagging and interpretation on this report are based on the  following declared medications.  Unexpected results may arise from  inaccuracies in the declared medications.  **Note: The testing scope of this panel includes these medications:  Alprazolam (Xanax)  Tramadol (Ultram)  **Note: The testing scope of this panel does not include following  reported medications:  Adalimumab (Humira)  Clonidine (Catapres)  Duloxetine (Cymbalta)  Estradiol (Estrace)  Methotrexate  Zolpidem (Ambien) ==================================================================== For clinical consultation, please call [phone] ====================================================================  UDS interpretation: Compliant          Medication Assessment Form: Reviewed. Patient indicates being compliant with therapy Treatment compliance: Compliant Risk Assessment Profile: Aberrant behavior: See prior evaluations. None observed or detected today Comorbid factors increasing risk of overdose: See prior notes. No additional risks detected today Opioid risk tool (ORT) (Total Score): 1 Personal History of Substance Abuse (SUD-Substance use disorder):  Alcohol: Negative  Illegal Drugs: Negative  Rx Drugs: Negative  ORT Risk Level calculation: Low Risk Risk of substance use disorder (SUD): Low Opioid Risk Tool - 06/07/18 1457      Family History of Substance Abuse   Alcohol  Negative    Illegal Drugs  Negative     Rx Drugs  Negative      Personal History of Substance Abuse   Alcohol  Negative    Illegal Drugs  Negative    Rx Drugs  Negative      History of Preadolescent Sexual Abuse   History of Preadolescent Sexual Abuse  Negative or Female      Psychological Disease   Psychological Disease  Negative    Depression  Positive      Total Score   Opioid Risk Tool Scoring  1    Opioid Risk Interpretation  Low Risk      ORT Scoring interpretation table:  Score <3 = Low Risk for SUD  Score between 4-7 = Moderate Risk for SUD  Score >8 = High Risk for Opioid Abuse   Risk Mitigation Strategies:  Patient Counseling: Covered Patient-Prescriber Agreement (PPA): Present and active  Notification to other healthcare providers: Done  Pharmacologic Plan: No change in therapy, at this time.             Laboratory Chemistry  Inflammation Markers (CRP: Acute Phase) (ESR: Chronic Phase) Lab Results  Component Value Date   CRP 2.2 07/08/2017   ESRSEDRATE CANCELED 07/08/2017                         Rheumatology Markers Lab Results  Component Value Date   LABURIC 2.5 07/08/2017                        Renal Function Markers Lab Results  Component Value Date   BUN 11 07/08/2017   CREATININE 0.66 07/08/2017   BCR 17 07/08/2017   GFRAA 114 07/08/2017   GFRNONAA 99 07/08/2017                             Hepatic Function Markers Lab Results  Component Value Date   AST 17 07/08/2017   ALBUMIN 4.4 07/08/2017   ALKPHOS 133 (H) 07/08/2017                        Electrolytes Lab Results  Component Value Date   NA 140 07/08/2017   K 4.2 07/08/2017   CL 103 07/08/2017   CALCIUM 9.0 07/08/2017   MG 2.1 07/08/2017                        Neuropathy Markers Lab Results  Component Value Date   VITAMINB12 310 07/08/2017   HGBA1C 5.10 02/11/2016                        CNS Tests No results found for: COLORCSF,  APPEARCSF, RBCCOUNTCSF, WBCCSF, POLYSCSF, LYMPHSCSF, EOSCSF, PROTEINCSF,  GLUCCSF, JCVIRUS, CSFOLI, IGGCSF                      Bone Pathology Markers Lab Results  Component Value Date   25OHVITD1 24 (L) 07/08/2017   25OHVITD2 <1.0 07/08/2017   25OHVITD3 24 07/08/2017                         Coagulation Parameters No results found for: INR, LABPROT, APTT, PLT, DDIMER, LABHEMA, VITAMINK1                      Cardiovascular Markers No results found for: BNP, CKTOTAL, CKMB, TROPONINI, HGB, HCT                       CA Markers No results found for: CEA, CA125, LABCA2                      Note: Lab results reviewed.  Recent Diagnostic Imaging Results  DG HIP UNILAT W OR W/O PELVIS 2-3 VIEWS RIGHT CLINICAL DATA:  RIGHT hip pain.  EXAM: DG HIP (WITH OR WITHOUT PELVIS) 2-3V RIGHT  COMPARISON:  None.  FINDINGS: There is no evidence of hip fracture or dislocation. There is no evidence of arthropathy or other focal bone abnormality.  IMPRESSION: Negative.  Electronically Signed   By: Staci Righter M.D.   On: 05/26/2018 17:05 DG HIP UNILAT W OR W/O PELVIS 2-3 VIEWS LEFT CLINICAL DATA:  Chronic BILATERAL hip pain.  EXAM: DG HIP (WITH OR WITHOUT PELVIS) 2-3V LEFT  COMPARISON:  RIGHT hip reported separately.  FINDINGS: There is no evidence of hip fracture or dislocation.  There is no evidence of arthropathy or other focal bone abnormality.  IMPRESSION: Negative.  Electronically Signed   By: Staci Righter M.D.   On: 05/26/2018 17:04  Complexity Note: Imaging results reviewed. Results shared with Ms. Niznik, using [employer].                         Meds   Current Outpatient Medications:  .  ALPRAZolam (XANAX) 0.5 MG tablet, TAKE 1 TABLET BY MOUTH EVERY NIGHT AT BEDTIME AS NEEDED, Disp: 30 tablet, Rfl: 0 .  aluminum chloride (DRYSOL) 20 % external solution, Drysol Dab-O-Matic 20 % topical solution, Disp: , Rfl:  .  cloNIDine (CATAPRES) 0.1 MG tablet, Take 1 tablet (0.1 mg total) by mouth 2 (two) times daily., Disp: 180 tablet, Rfl:  0 .  DULoxetine (CYMBALTA) 60 MG capsule, Take 1 capsule (60 mg total) by mouth daily., Disp: 90 capsule, Rfl: 1 .  estradiol (ESTRACE) 0.1 MG/GM vaginal cream, Place 1 Applicatorful vaginally at bedtime. Insert 1g nightly for 1 wk, then 1 g once weekly as maintenace, Disp: 42.5 g, Rfl: 1 .  HUMIRA PEN 40 MG/0.8ML PNKT, Inject 40 mg into the skin 2 (two) times a week., Disp: , Rfl: 3 .  meloxicam (MOBIC) 7.5 MG tablet, Take 7.5 mg by mouth daily., Disp: , Rfl:  .  QUEtiapine (SEROQUEL) 25 MG tablet, Take 1 tablet (25 mg total) by mouth at bedtime., Disp: 30 tablet, Rfl: 0 .  tiZANidine (ZANAFLEX) 4 MG tablet, TAKE 1 TABLET BY MOUTH  EVERY 6 HOURS AS NEEDED FOR MUSCLE SPASM(S), Disp: 120 tablet, Rfl: 0 .  zolpidem (AMBIEN CR) 12.5 MG CR tablet, Take 1 tablet (12.5  mg total) by mouth at bedtime., Disp: 90 tablet, Rfl: 0 .  [START ON 06/18/2018] traMADol (ULTRAM) 50 MG tablet, Take 1 tablet (50 mg total) by mouth every 12 (twelve) hours as needed for severe pain., Disp: 60 tablet, Rfl: 2  ROS  Constitutional: Denies any fever or chills Gastrointestinal: No reported hemesis, hematochezia, vomiting, or acute GI distress Musculoskeletal: Denies any acute onset joint swelling, redness, loss of ROM, or weakness Neurological: No reported episodes of acute onset apraxia, aphasia, dysarthria, agnosia, amnesia, paralysis, loss of coordination, or loss of consciousness  Allergies  Candice Guzman is allergic to codeine and methotrexate.  PFSH  Drug: Candice Guzman  reports that she does not use drugs. Alcohol:  reports that she does not drink alcohol. Tobacco:  reports that she has never smoked. She has never used smokeless tobacco. Medical:  has a past medical history of Depression, Insomnia, Lumbar herniated disc, Menopause, Rheumatoid arthritis ([place]), and Vitamin D deficiency. Surgical: Ms. Fisk  has a past surgical history that includes Ganglion cyst excision; Cesarean section; Breast biopsy (Left, 2010);  Metacarpophalangeal joint arthroplasty (Right, 06/06/2016); and Finger arthroplasty (Left, 07/24/2017). Family: family history includes Cancer in her brother; Cancer (age of onset: 19) in her father; Emphysema in her mother.  Constitutional Exam  General appearance: Well nourished, well developed, and well hydrated. In no apparent acute distress Vitals:   06/07/18 1451  BP: (!) 162/86  Pulse: 88  Temp: 97.8 F (36.6 C)  SpO2: 100%  Weight: 128 lb (58.1 kg)  Height: 5' 4"  (1.626 m)  Psych/Mental status: Alert, oriented x 3 (person, place, & time)       Eyes: PERLA Respiratory: No evidence of acute respiratory distress  Lumbar Spine Area Exam  Skin & Axial Inspection: No masses, redness, or swelling Alignment: Symmetrical Functional ROM: Unrestricted ROM       Stability: No instability detected Muscle Tone/Strength: Functionally intact. No obvious neuro-muscular anomalies detected. Sensory (Neurological): Unimpaired Palpation: No palpable anomalies         Gait & Posture Assessment  Ambulation: Unassisted Gait: Relatively normal for age and body habitus Posture: WNL   Lower Extremity Exam    Side: Right lower extremity  Side: Left lower extremity  Stability: No instability observed          Stability: No instability observed          Skin & Extremity Inspection: Skin color, temperature, and hair growth are WNL. No peripheral edema or cyanosis. No masses, redness, swelling, asymmetry, or associated skin lesions. No contractures.  Skin & Extremity Inspection: Skin color, temperature, and hair growth are WNL. No peripheral edema or cyanosis. No masses, redness, swelling, asymmetry, or associated skin lesions. No contractures.  Functional ROM: Unrestricted ROM                  Functional ROM: Unrestricted ROM                  Muscle Tone/Strength: Functionally intact. No obvious neuro-muscular anomalies detected.  Muscle Tone/Strength: Functionally intact. No obvious neuro-muscular  anomalies detected.  Sensory (Neurological): Unimpaired        Sensory (Neurological): Unimpaired        DTR: Patellar: deferred today Achilles: deferred today Plantar: deferred today  DTR: Patellar: deferred today Achilles: deferred today Plantar: deferred today  Palpation: No palpable anomalies  Palpation: No palpable anomalies   Assessment  Primary Diagnosis & Pertinent Problem List: The primary encounter diagnosis was Spondylosis without myelopathy or radiculopathy,  lumbosacral region. Diagnoses of Bulge of lumbar disc without myelopathy, Chronic pain syndrome, and Chronic hip pain (Tertiary Area of Pain) (Bilateral) (R>L) were also pertinent to this visit.  Status Diagnosis  Controlled Controlled Controlled 1. Spondylosis without myelopathy or radiculopathy, lumbosacral region   2. Bulge of lumbar disc without myelopathy   3. Chronic pain syndrome   4. Chronic hip pain (Tertiary Area of Pain) (Bilateral) (R>L)     Problems updated and reviewed during this visit: No problems updated. Plan of Care  Pharmacotherapy (Medications Ordered): Meds ordered this encounter  Medications  . traMADol (ULTRAM) 50 MG tablet    Sig: Take 1 tablet (50 mg total) by mouth every 12 (twelve) hours as needed for severe pain.    Dispense:  60 tablet    Refill:  2    Do not place this medication, or any other prescription from our practice, on "Automatic Refill". Patient may have prescription filled one day early if pharmacy is closed on scheduled refill date.    Order Specific Question:   Supervising Provider    Answer:   Milinda Pointer [[id number]]   New Prescriptions   No medications on file   Medications administered today: Candice Guzman had no medications administered during this visit. Lab-work, procedure(s), and/or referral(s): No orders of the defined types were placed in this encounter.  Imaging and/or referral(s): None Interventional management options: Planned, scheduled,  and/or pending:   Diagnostic bilateral L5 transforaminal ESI#1under fluoroscopic guidance and IV sedation    Considering:   Diagnostic bilateral L5 transforaminal LESI Diagnostic bilateral lumbar facet block Possible bilateral lumbar facet RFA Diagnostic bilateral intra-articular hip joint injection Diagnostic bilateral femoral nerve block + obturator nerve block Possible bilateral femoral nerve + obturator nerve RFA Diagnostic bilateral intra-articular shoulder joint injection Diagnostic bilateral suprascapular nerve block Possible bilateral suprascapular nerve RFA Diagnostic Bilateral intra-articular wrist injections Diagnosticbilateral intra-articular knee injections Possible bilateral intra-articular Hyalgan knee injection Diagnostic bilateral genicular nerve block Possible bilateral genicular nerve RFA   Palliative PRN treatment(s):   None at this time    Provider-requested follow-up: Return in about 6 months (around 12/07/2018) for MedMgmt.  Future Appointments  Date Time Provider [place]  06/21/2018  8:00 AM [facility] 1 [facility]  08/27/2018  3:40 PM Steele Sizer, MD [facility]   Primary Care Physician: Steele Sizer, MD Location: [facility] Outpatient Pain Management Facility Note by: Vevelyn Francois NP Date: 06/07/2018; Time: 3:08 PM  Pain Score Disclaimer: We use the NRS-11 scale. This is a self-reported, subjective measurement of pain severity with only modest accuracy. It is used primarily to identify changes within a particular patient. It must be understood that outpatient pain scales are significantly less accurate that those used for research, where they can be applied under ideal controlled circumstances with minimal exposure to variables. In reality, the score is likely to be a combination of pain intensity and pain affect, where pain affect describes the degree of emotional arousal or changes in action readiness caused by the  sensory experience of pain. Factors such as social and work situation, setting, emotional state, anxiety levels, expectation, and prior pain experience may influence pain perception and show large inter-individual differences that may also be affected by time variables.  Patient instructions provided during this appointment: Patient Instructions  ____________________________________________________________________________________________  Medication Rules  Purpose: To inform patients, and their family members, of our rules and regulations.  Applies to: All patients receiving prescriptions (written or electronic).  Pharmacy of record: Pharmacy where  electronic prescriptions will be sent. If written prescriptions are taken to a different pharmacy, please inform the nursing staff. The pharmacy listed in the electronic medical record should be the one where you would like electronic prescriptions to be sent.  Electronic prescriptions: In compliance with the [place] (STOP) Act of 2017 (Session Lanny Cramp [phone]), effective July 07, 2018, all controlled substances must be electronically prescribed. Calling prescriptions to the pharmacy will cease to exist.  Prescription refills: Only during scheduled appointments. Applies to all prescriptions.  NOTE: The following applies primarily to controlled substances (Opioid* Pain Medications).   Patient's responsibilities: 1. Pain Pills: Bring all pain pills to every appointment (except for procedure appointments). 2. Pill Bottles: Bring pills in original pharmacy bottle. Always bring the newest bottle. Bring bottle, even if empty. 3. Medication refills: You are responsible for knowing and keeping track of what medications you take and those you need refilled. The day before your appointment: write a list of all prescriptions that need to be refilled. The day of the appointment: give the list to the admitting  nurse. Prescriptions will be written only during appointments. If you forget a medication: it will not be "Called in", "Faxed", or "electronically sent". You will need to get another appointment to get these prescribed. No early refills. Do not call asking to have your prescription filled early. 4. Prescription Accuracy: You are responsible for carefully inspecting your prescriptions before leaving our office. Have the discharge nurse carefully go over each prescription with you, before taking them home. Make sure that your name is accurately spelled, that your address is correct. Check the name and dose of your medication to make sure it is accurate. Check the number of pills, and the written instructions to make sure they are clear and accurate. Make sure that you are given enough medication to last until your next medication refill appointment. 5. Taking Medication: Take medication as prescribed. When it comes to controlled substances, taking less pills or less frequently than prescribed is permitted and encouraged. Never take more pills than instructed. Never take medication more frequently than prescribed.  6. Inform other Doctors: Always inform, all of your healthcare providers, of all the medications you take. 7. Pain Medication from other Providers: You are not allowed to accept any additional pain medication from any other Doctor or Healthcare provider. There are two exceptions to this rule. (see below) In the event that you require additional pain medication, you are responsible for notifying us, as stated below. 8. Medication Agreement: You are responsible for carefully reading and following our Medication Agreement. This must be signed before receiving any prescriptions from our practice. Safely store a copy of your signed Agreement. Violations to the Agreement will result in no further prescriptions. (Additional copies of our Medication Agreement are available upon request.) 9. Laws, Rules, &  Regulations: All patients are expected to follow all Federal and [employer], [employer], Rules, [employer]. Ignorance of the Laws does not constitute a valid excuse. The use of any illegal substances is prohibited. 10. Adopted CDC guidelines & recommendations: Target dosing levels will be at or below 60 MME/day. Use of benzodiazepines** is not recommended.  Exceptions: There are only two exceptions to the rule of not receiving pain medications from other Healthcare Providers. 1. Exception #1 (Emergencies): In the event of an emergency (i.e.: accident requiring emergency care), you are allowed to receive additional pain medication. However, you are responsible for: As soon as you are able, call  our office [phone], at any time of the day or night, and leave a message stating your name, the date and nature of the emergency, and the name and dose of the medication prescribed. In the event that your call is answered by a member of our staff, make sure to document and save the date, time, and the name of the person that took your information.  2. Exception #2 (Planned Surgery): In the event that you are scheduled by another doctor or dentist to have any type of surgery or procedure, you are allowed (for a period no longer than 30 days), to receive additional pain medication, for the acute post-op pain. However, in this case, you are responsible for picking up a copy of our "Post-op Pain Management for Surgeons" handout, and giving it to your surgeon or dentist. This document is available at our office, and does not require an appointment to obtain it. Simply go to our office during business hours (Monday-Thursday from 8:00 AM to 4:00 PM) (Friday 8:00 AM to 12:00 Noon) or if you have a scheduled appointment with [place], prior to your surgery, and ask for it by name. In addition, you will need to provide [place] with your name, name of your surgeon, type of surgery, and date of procedure or surgery.  *Opioid  medications include: morphine, codeine, oxycodone, oxymorphone, hydrocodone, hydromorphone, meperidine, tramadol, tapentadol, buprenorphine, fentanyl, methadone. **Benzodiazepine medications include: diazepam (Valium), alprazolam (Xanax), clonazepam (Klonopine), lorazepam (Ativan), clorazepate (Tranxene), chlordiazepoxide (Librium), estazolam (Prosom), oxazepam (Serax), temazepam (Restoril), triazolam (Halcion) (Last updated: 09/03/2017) ____________________________________________________________________________________________

## 2018-06-07 NOTE — Progress Notes (Signed)
Nursing Pain Medication Assessment:  Safety precautions to be maintained throughout the outpatient stay will include: orient to surroundings, keep bed in low position, maintain call bell within reach at all times, provide assistance with transfer out of bed and ambulation.  Medication Inspection Compliance: Pill count conducted under aseptic conditions, in front of the patient. Neither the pills nor the bottle was removed from the patient's sight at any time. Once count was completed pills were immediately returned to the patient in their original bottle.  Medication: Tramadol (Ultram) Pill/Patch Count: 16 of 60 pills remain Pill/Patch Appearance: Markings consistent with prescribed medication Bottle Appearance: Standard pharmacy container. Clearly labeled. Filled Date: 67 / 25 / 2019 Last Medication intake:  Yesterday

## 2018-06-21 ENCOUNTER — Other Ambulatory Visit: Payer: Self-pay | Admitting: Family Medicine

## 2018-06-21 ENCOUNTER — Ambulatory Visit
Admission: RE | Admit: 2018-06-21 | Discharge: 2018-06-21 | Disposition: A | Discharge status: Home / Self Care | Payer: Managed Care, Other (non HMO) | Source: Ambulatory Visit | Attending: Pain Medicine | Admitting: Pain Medicine

## 2018-06-21 DIAGNOSIS — M79605 Pain in left leg: Secondary | ICD-10-CM | POA: Insufficient documentation

## 2018-06-21 DIAGNOSIS — M4310 Spondylolisthesis, site unspecified: Secondary | ICD-10-CM | POA: Diagnosis not present

## 2018-06-21 DIAGNOSIS — M5136 Other intervertebral disc degeneration, lumbar region: Secondary | ICD-10-CM | POA: Diagnosis not present

## 2018-06-21 DIAGNOSIS — M5442 Lumbago with sciatica, left side: Secondary | ICD-10-CM | POA: Insufficient documentation

## 2018-06-21 DIAGNOSIS — G8929 Other chronic pain: Secondary | ICD-10-CM | POA: Insufficient documentation

## 2018-06-21 DIAGNOSIS — M47816 Spondylosis without myelopathy or radiculopathy, lumbar region: Secondary | ICD-10-CM | POA: Insufficient documentation

## 2018-06-21 DIAGNOSIS — M48061 Spinal stenosis, lumbar region without neurogenic claudication: Secondary | ICD-10-CM | POA: Diagnosis not present

## 2018-06-21 DIAGNOSIS — M47817 Spondylosis without myelopathy or radiculopathy, lumbosacral region: Secondary | ICD-10-CM | POA: Insufficient documentation

## 2018-06-21 DIAGNOSIS — M4807 Spinal stenosis, lumbosacral region: Secondary | ICD-10-CM | POA: Diagnosis not present

## 2018-06-21 DIAGNOSIS — G4700 Insomnia, unspecified: Secondary | ICD-10-CM

## 2018-06-21 DIAGNOSIS — M79604 Pain in right leg: Secondary | ICD-10-CM | POA: Insufficient documentation

## 2018-06-21 DIAGNOSIS — M5441 Lumbago with sciatica, right side: Secondary | ICD-10-CM | POA: Diagnosis present

## 2018-06-21 NOTE — Telephone Encounter (Signed)
Refill request for general medication: Ambien CR 12.5  Last office visit: 05/26/2018  Last physical exam: None Indicated  Follow-ups on file. 08/27/2018

## 2018-07-15 ENCOUNTER — Telehealth: Payer: Self-pay

## 2018-07-15 NOTE — Telephone Encounter (Signed)
Copied from CRM [id number]. Topic: General - Other >> Jul 15, 2018  9:09 AM Jay SchlichterWeikart, Melissa J wrote: Reason for CRM: regarding referral to Dr Maryruth BunKapur - the specialist has called pt 2 times, she said that she would call back.  They called again, and pt never returned call to schedule.   [facility]

## 2018-07-20 ENCOUNTER — Other Ambulatory Visit: Payer: Self-pay | Admitting: Family Medicine

## 2018-07-20 ENCOUNTER — Other Ambulatory Visit: Payer: Self-pay

## 2018-07-20 ENCOUNTER — Ambulatory Visit
Admission: RE | Admit: 2018-07-20 | Discharge: 2018-07-20 | Disposition: A | Discharge status: Home / Self Care | Payer: Managed Care, Other (non HMO) | Source: Ambulatory Visit | Attending: Pain Medicine | Admitting: Pain Medicine

## 2018-07-20 ENCOUNTER — Encounter: Payer: Self-pay | Admitting: Pain Medicine

## 2018-07-20 ENCOUNTER — Ambulatory Visit (HOSPITAL_BASED_OUTPATIENT_CLINIC_OR_DEPARTMENT_OTHER): Payer: Managed Care, Other (non HMO) | Admitting: Pain Medicine

## 2018-07-20 VITALS — BP 141/78 | HR 92 | Temp 98.2°F | Resp 19 | Ht 64.0 in | Wt 128.0 lb

## 2018-07-20 DIAGNOSIS — G8929 Other chronic pain: Secondary | ICD-10-CM

## 2018-07-20 DIAGNOSIS — N951 Menopausal and female climacteric states: Secondary | ICD-10-CM

## 2018-07-20 DIAGNOSIS — M48061 Spinal stenosis, lumbar region without neurogenic claudication: Secondary | ICD-10-CM | POA: Diagnosis present

## 2018-07-20 DIAGNOSIS — M4310 Spondylolisthesis, site unspecified: Secondary | ICD-10-CM | POA: Diagnosis present

## 2018-07-20 DIAGNOSIS — M5442 Lumbago with sciatica, left side: Secondary | ICD-10-CM | POA: Insufficient documentation

## 2018-07-20 DIAGNOSIS — M5441 Lumbago with sciatica, right side: Secondary | ICD-10-CM

## 2018-07-20 DIAGNOSIS — M5136 Other intervertebral disc degeneration, lumbar region: Secondary | ICD-10-CM | POA: Diagnosis present

## 2018-07-20 DIAGNOSIS — M5416 Radiculopathy, lumbar region: Secondary | ICD-10-CM | POA: Insufficient documentation

## 2018-07-20 DIAGNOSIS — M5417 Radiculopathy, lumbosacral region: Secondary | ICD-10-CM | POA: Diagnosis present

## 2018-07-20 DIAGNOSIS — M79605 Pain in left leg: Secondary | ICD-10-CM | POA: Diagnosis present

## 2018-07-20 DIAGNOSIS — M79604 Pain in right leg: Secondary | ICD-10-CM | POA: Diagnosis present

## 2018-07-20 MED ORDER — SODIUM CHLORIDE 0.9% FLUSH
1.0000 mL | Freq: Once | INTRAVENOUS | Status: AC
  Administered 2018-07-20: 1 mL

## 2018-07-20 MED ORDER — DEXAMETHASONE SODIUM PHOSPHATE 10 MG/ML IJ SOLN
10.0000 mg | Freq: Once | INTRAMUSCULAR | Status: AC
  Administered 2018-07-20: 10 mg
  Filled 2018-07-20: qty 1

## 2018-07-20 MED ORDER — LIDOCAINE HCL 2 % IJ SOLN
20.0000 mL | Freq: Once | INTRAMUSCULAR | Status: AC
  Administered 2018-07-20: 400 mg
  Filled 2018-07-20: qty 40

## 2018-07-20 MED ORDER — ROPIVACAINE HCL 2 MG/ML IJ SOLN
1.0000 mL | Freq: Once | INTRAMUSCULAR | Status: DC
  Filled 2018-07-20: qty 10

## 2018-07-20 MED ORDER — ROPIVACAINE HCL 2 MG/ML IJ SOLN
1.0000 mL | Freq: Once | INTRAMUSCULAR | Status: AC
  Administered 2018-07-20: 1 mL via EPIDURAL
  Filled 2018-07-20: qty 10

## 2018-07-20 MED ORDER — LACTATED RINGERS IV SOLN
1000.0000 mL | Freq: Once | INTRAVENOUS | Status: AC
  Administered 2018-07-20: 1000 mL via INTRAVENOUS

## 2018-07-20 MED ORDER — IOPAMIDOL (ISOVUE-M 200) INJECTION 41%
10.0000 mL | Freq: Once | INTRAMUSCULAR | Status: AC
  Administered 2018-07-20: 1 mL via EPIDURAL
  Filled 2018-07-20: qty 10

## 2018-07-20 MED ORDER — MIDAZOLAM HCL 5 MG/5ML IJ SOLN
1.0000 mg | INTRAMUSCULAR | Status: DC | PRN
  Administered 2018-07-20: 3 mg via INTRAVENOUS
  Filled 2018-07-20: qty 5

## 2018-07-20 MED ORDER — FENTANYL CITRATE (PF) 100 MCG/2ML IJ SOLN
25.0000 ug | INTRAMUSCULAR | Status: DC | PRN
  Administered 2018-07-20: 100 ug via INTRAVENOUS
  Filled 2018-07-20: qty 2

## 2018-07-20 NOTE — Patient Instructions (Signed)

## 2018-07-20 NOTE — Progress Notes (Signed)
Patient's Name: Candice Guzman  MRN: [id number]  Referring Provider: Alba CorySowles, Krichna, MD  DOB: August 08, 1960  PCP: Alba CorySowles, Krichna, MD  DOS: 07/20/2018  Note by: Oswaldo DoneFrancisco A Vicent Febles, MD  Service setting: Ambulatory outpatient  Specialty: Interventional Pain Management  Patient type: Established  Location: [facility] (AMB) Pain Management Facility  Visit type: Interventional Procedure   Primary Reason for Visit: Interventional Pain Management Treatment. CC: Back Pain (low)  Procedure:          Anesthesia, Analgesia, Anxiolysis:  Type: Trans-Foraminal Epidural Steroid Injection #2  Purpose: Diagnostic/Therapeutic Region: Posterolateral Lumbosacral Target Area: The 6 o'clock position under the pedicle, on the affected side. Approach: Posterior Percutaneous Paravertebral approach. Level: L5 Level Laterality: Bilateral Paravertebral  Type: Moderate (Conscious) Sedation combined with Local Anesthesia Indication(s): Analgesia and Anxiety Route: Intravenous (IV) IV Access: Secured Sedation: Meaningful verbal contact was maintained at all times during the procedure  Local Anesthetic: Lidocaine 1-2%  Position: Prone   Indications: 1. DDD (degenerative disc disease), lumbar   2. Lumbar foraminal stenosis (L5-S1) (Bilateral)   3. Grade 1 Anterolisthesis of L5 over S1 & L3 over L4   4. 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 Lumbosacral radiculitis   7. Chronic lower extremity pain (Primary Area of Pain) (Bilateral) (R>L)   8. Chronic low back pain (Secondary Area of Pain) (Bilateral) (R>L)    Pain Score: Pre-procedure: 6 /10 Post-procedure: 0-No pain/10  Pre-op Assessment:  [name] is a 58 y.o. (year old), female patient, seen today for interventional treatment. She  has a past surgical history that includes Ganglion cyst excision; Cesarean section; Breast biopsy (Left, 2010); Metacarpophalangeal  joint arthroplasty (Right, 06/06/2016); and Finger arthroplasty (Left, 07/24/2017). [name] has a current medication list which includes the following prescription(s): alprazolam, aluminum chloride, clonidine, duloxetine, estradiol, humira pen, meloxicam, quetiapine, tizanidine, tramadol, and zolpidem, and the following Facility-Administered Medications: fentanyl, midazolam, and ropivacaine (pf) 2 mg/ml (0.2%). Her primarily concern today is the Back Pain (low)  Initial Vital Signs:  Pulse/HCG Rate: 92ECG Heart Rate: 89 Temp: 98.4 F (36.9 C) Resp: 18 BP: (!) 141/100(took her BP this am) SpO2: 97 %  BMI: Estimated body mass index is 21.97 kg/m as calculated from the following:   Height as of this encounter: 5\' 4"  (1.626 m).   Weight as of this encounter: 128 lb (58.1 kg).  Risk Assessment: Allergies: Reviewed. She is allergic to codeine and methotrexate.  Allergy Precautions: None required Coagulopathies: Reviewed. None identified.  Blood-thinner therapy: None at this time Active Infection(s): Reviewed. None identified. [name] is afebrile  Site Confirmation: [name] was asked to confirm the procedure and laterality before marking the site Procedure checklist: Completed Consent: Before the procedure and under the influence of no sedative(s), amnesic(s), or anxiolytics, the patient was informed of the treatment options, risks and possible complications. To fulfill our ethical and legal obligations, as recommended by the American Medical Association's Code of Ethics, I have informed the patient of my clinical impression; the nature and purpose of the treatment or procedure; the risks, benefits, and possible complications of the intervention; the alternatives, including doing nothing; the risk(s) and benefit(s) of the alternative treatment(s) or procedure(s); and the risk(s) and benefit(s) of doing nothing. The patient was provided information about the general risks and possible  complications associated with the procedure. These may include, but are not limited to: failure to achieve desired goals, infection, bleeding,  organ or nerve damage, allergic reactions, paralysis, and death. In addition, the patient was informed of those risks and complications associated to Spine-related procedures, such as failure to decrease pain; infection (i.e.: Meningitis, epidural or intraspinal abscess); bleeding (i.e.: epidural hematoma, subarachnoid hemorrhage, or any other type of intraspinal or peri-dural bleeding); organ or nerve damage (i.e.: Any type of peripheral nerve, nerve root, or spinal cord injury) with subsequent damage to sensory, motor, and/or autonomic systems, resulting in permanent pain, numbness, and/or weakness of one or several areas of the body; allergic reactions; (i.e.: anaphylactic reaction); and/or death. Furthermore, the patient was informed of those risks and complications associated with the medications. These include, but are not limited to: allergic reactions (i.e.: anaphylactic or anaphylactoid reaction(s)); adrenal axis suppression; blood sugar elevation that in diabetics may result in ketoacidosis or comma; water retention that in patients with history of congestive heart failure may result in shortness of breath, pulmonary edema, and decompensation with resultant heart failure; weight gain; swelling or edema; medication-induced neural toxicity; particulate matter embolism and blood vessel occlusion with resultant organ, and/or nervous system infarction; and/or aseptic necrosis of one or more joints. Finally, the patient was informed that Medicine is not an exact science; therefore, there is also the possibility of unforeseen or unpredictable risks and/or possible complications that may result in a catastrophic outcome. The patient indicated having understood very clearly. We have given the patient no guarantees and we have made no promises. Enough time was given to the  patient to ask questions, all of which were answered to the patient's satisfaction. Ms. Illingworth has indicated that she wanted to continue with the procedure. Attestation: I, the ordering provider, attest that I have discussed with the patient the benefits, risks, side-effects, alternatives, likelihood of achieving goals, and potential problems during recovery for the procedure that I have provided informed consent. Date  Time: 07/20/2018  8:08 AM  Pre-Procedure Preparation:  Monitoring: As per clinic protocol. Respiration, ETCO2, SpO2, BP, heart rate and rhythm monitor placed and checked for adequate function Safety Precautions: Patient was assessed for positional comfort and pressure points before starting the procedure. Time-out: I initiated and conducted the "Time-out" before starting the procedure, as per protocol. The patient was asked to participate by confirming the accuracy of the "Time Out" information. Verification of the correct person, site, and procedure were performed and confirmed by me, the nursing staff, and the patient. "Time-out" conducted as per Joint Commission's Universal Protocol (UP.01.01.01). Time: 0902  Description of Procedure:          Area Prepped: Entire Posterior Lumbosacral Area Prepping solution: ChloraPrep (2% chlorhexidine gluconate and 70% isopropyl alcohol) Safety Precautions: Aspiration looking for blood return was conducted prior to all injections. At no point did we inject any substances, as a needle was being advanced. No attempts were made at seeking any paresthesias. Safe injection practices and needle disposal techniques used. Medications properly checked for expiration dates. SDV (single dose vial) medications used. Description of the Procedure: Protocol guidelines were followed. The patient was placed in position over the procedure table. The target area was identified and the area prepped in the usual manner. Skin & deeper tissues infiltrated with local  anesthetic. Appropriate amount of time allowed to pass for local anesthetics to take effect. The procedure needles were then advanced to the target area. Proper needle placement secured. Negative aspiration confirmed. Solution injected in intermittent fashion, asking for systemic symptoms every 0.5cc of injectate. The needles were then removed and the area cleansed,  making sure to leave some of the prepping solution back to take advantage of its long term bactericidal properties.  Vitals:   07/20/18 0912 07/20/18 0922 07/20/18 0932 07/20/18 0942  BP: 138/86 (!) 145/81 (!) 141/78   Pulse:      Resp: 18 19 18 19   Temp:  98.8 F (37.1 C)  98.2 F (36.8 C)  SpO2: 96% 100% 100% 100%  Weight:      Height:        Start Time: 0902 hrs. End Time: 0912 hrs.  Materials:  Needle(s) Type: Spinal Needle Gauge: 22G Length: 3.5-in Medication(s): Please see orders for medications and dosing details.  Imaging Guidance (Spinal):          Type of Imaging Technique: Fluoroscopy Guidance (Spinal) Indication(s): Assistance in needle guidance and placement for procedures requiring needle placement in or near specific anatomical locations not easily accessible without such assistance. Exposure Time: Please see nurses notes. Contrast: Before injecting any contrast, we confirmed that the patient did not have an allergy to iodine, shellfish, or radiological contrast. Once satisfactory needle placement was completed at the desired level, radiological contrast was injected. Contrast injected under live fluoroscopy. No contrast complications. See chart for type and volume of contrast used. Fluoroscopic Guidance: I was personally present during the use of fluoroscopy. "Tunnel Vision Technique" used to obtain the best possible view of the target area. Parallax error corrected before commencing the procedure. "Direction-depth-direction" technique used to introduce the needle under continuous pulsed fluoroscopy. Once  target was reached, antero-posterior, oblique, and lateral fluoroscopic projection used confirm needle placement in all planes. Images permanently stored in EMR.          Interpretation: I personally interpreted the imaging intraoperatively. Adequate needle placement confirmed in multiple planes. Appropriate spread of contrast into desired area was observed. No evidence of afferent or efferent intravascular uptake. No intrathecal or subarachnoid spread observed. Permanent images saved into the patient's record.  Antibiotic Prophylaxis:   Anti-infectives (From admission, onward)   None     Indication(s): None identified  Post-operative Assessment:  Post-procedure Vital Signs:  Pulse/HCG Rate: 9279 Temp: 98.2 F (36.8 C) Resp: 19 BP: (!) 141/78 SpO2: 100 %  EBL: None  Complications: No immediate post-treatment complications observed by team, or reported by patient.  Note: The patient tolerated the entire procedure well. A repeat set of vitals were taken after the procedure and the patient was kept under observation following institutional policy, for this type of procedure. Post-procedural neurological assessment was performed, showing return to baseline, prior to discharge. The patient was provided with post-procedure discharge instructions, including a section on how to identify potential problems. Should any problems arise concerning this procedure, the patient was given instructions to immediately contact us, at any time, without hesitation. In any case, we plan to contact the patient by telephone for a follow-up status report regarding this interventional procedure.  Comments:  No additional relevant information.  Plan of Care  Interventional management options: Planned, scheduled, and/or pending:   Diagnostic bilateral L5 transforaminal ESI#2under fluoroscopic guidance and IV sedation, today   Considering:   Diagnostic bilateral L5 transforaminal LESI Diagnostic  bilateral lumbar facet block Possible bilateral lumbar facet RFA Diagnostic bilateral intra-articular hip joint injection Diagnostic bilateral femoral nerve block + obturator nerve block Possible bilateral femoral nerve + obturator nerve RFA Diagnostic bilateral intra-articular shoulder joint injection Diagnostic bilateral suprascapular nerve block Possible bilateral suprascapular nerve RFA Diagnostic Bilateral intra-articular wrist injections Diagnosticbilateral intra-articular knee injections Possible bilateral intra-articular  Hyalgan knee injection Diagnostic bilateral genicular nerve block Possible bilateral genicular nerve RFA   Palliative PRN treatment(s):   None at this time    Imaging Orders     DG C-Arm 1-60 Min-No Report  Procedure Orders     Lumbar Transforaminal Epidural  Medications ordered for procedure: Meds ordered this encounter  Medications  . iopamidol (ISOVUE-M) 41 % intrathecal injection 10 mL    Must be Myelogram-compatible. If not available, you may substitute with a water-soluble, non-ionic, hypoallergenic, myelogram-compatible radiological contrast medium.  Marland Kitchen lidocaine (XYLOCAINE) 2 % (with pres) injection 400 mg  . midazolam (VERSED) 5 MG/5ML injection 1-2 mg    Make sure Flumazenil is available in the pyxis when using this medication. If oversedation occurs, administer 0.2 mg IV over 15 sec. If after 45 sec no response, administer 0.2 mg again over 1 min; may repeat at 1 min intervals; not to exceed 4 doses (1 mg)  . fentaNYL (SUBLIMAZE) injection 25-50 mcg    Make sure Narcan is available in the pyxis when using this medication. In the event of respiratory depression (RR< 8/min): Titrate NARCAN (naloxone) in increments of 0.1 to 0.2 mg IV at 2-3 minute intervals, until desired degree of reversal.  . lactated ringers infusion 1,000 mL  . sodium chloride flush (NS) 0.9 % injection 1 mL  . ropivacaine (PF) 2 mg/mL (0.2%) (NAROPIN) injection  1 mL  . dexamethasone (DECADRON) injection 10 mg  . sodium chloride flush (NS) 0.9 % injection 1 mL  . ropivacaine (PF) 2 mg/mL (0.2%) (NAROPIN) injection 1 mL  . dexamethasone (DECADRON) injection 10 mg   Medications administered: We administered iopamidol, lidocaine, midazolam, fentaNYL, lactated ringers, sodium chloride flush, dexamethasone, sodium chloride flush, ropivacaine (PF) 2 mg/mL (0.2%), and dexamethasone.  See the medical record for exact dosing, route, and time of administration.  Disposition: Discharge home  Discharge Date & Time: 07/20/2018; 0944 hrs.   Physician-requested Follow-up: Return for post-procedure eval (2 wks), w/ Dr. Laban Emperor.  Future Appointments  Date Time Provider Department Center  08/04/2018  8:15 AM Delano Metz, MD [facility] None  08/27/2018  3:40 PM Alba Cory, MD [facility]  12/07/2018  2:30 PM Barbette Merino, NP [facility] None   Primary Care Physician: Alba Cory, MD Location: [facility] Outpatient Pain Management Facility Note by: [name], MD Date: 07/20/2018; Time: 9:59 AM  Disclaimer:  Medicine is not an Visual merchandiser. The only guarantee in medicine is that nothing is guaranteed. It is important to note that the decision to proceed with this intervention was based on the information collected from the patient. The Data and conclusions were drawn from the patient's questionnaire, the interview, and the physical examination. Because the information was provided in large part by the patient, it cannot be guaranteed that it has not been purposely or unconsciously manipulated. Every effort has been made to obtain as much relevant data as possible for this evaluation. It is important to note that the conclusions that lead to this procedure are derived in large part from the available data. Always take into account that the treatment will also be dependent on availability of resources and existing treatment guidelines, considered by  other Pain Management Practitioners as being common knowledge and practice, at the time of the intervention. For Medico-Legal purposes, it is also important to point out that variation in procedural techniques and pharmacological choices are the acceptable norm. The indications, contraindications, technique, and results of the above procedure should only be interpreted and  judged by a Board-Certified Interventional Pain Specialist with extensive familiarity and expertise in the same exact procedure and technique.

## 2018-07-21 ENCOUNTER — Telehealth: Payer: Self-pay | Admitting: *Deleted

## 2018-07-21 NOTE — Telephone Encounter (Signed)
Refill request for general medication: Clonidine 0.1 mg  Last office visit: 05/26/2018  Last physical exam: None indicated  Follow-ups on file. 08/27/2018

## 2018-07-21 NOTE — Telephone Encounter (Signed)
Attempted to call for post procedure follow-up. Message left. 

## 2018-08-03 NOTE — Progress Notes (Signed)
Patient's Name: Candice Guzman  MRN: [id number]  Referring Provider: Steele Sizer, MD  DOB: 04-10-1961  PCP: Steele Sizer, MD  DOS: 08/04/2018  Note by: Gaspar Cola, MD  Service setting: Ambulatory outpatient  Specialty: Interventional Pain Management  Location: [facility] (AMB) Pain Management Facility    Patient type: Established   Primary Reason(s) for Visit: Encounter for post-procedure evaluation of chronic illness with mild to moderate exacerbation CC: Foot Pain  HPI  Candice Guzman is a 58 y.o. year old, female patient, who comes today for a post-procedure evaluation. She has Insomnia, persistent; Bulge of lumbar disc without myelopathy; Depression, major, single episode, moderate ([place]); Lumbosacral radiculopathy (L5) (Right); Menopausal symptom; Vitamin D deficiency; Inflammatory arthritis ([place]); Chronic pain of multiple joints; Chronic pain syndrome; Chronic knee pain (Fourth Area of Pain) (Bilateral); Chronic wrist pain (Fifth Area of Pain) (Bilateral); Disorder of skeletal system; Other long term (current) drug therapy; Long term current use of opiate analgesic; Other specified health status; Long term prescription benzodiazepine use; Chronic lower extremity pain (Primary Area of Pain) (Bilateral) (R>L); Chronic low back pain (Secondary Area of Pain) (Bilateral) (R>L); Pharmacologic therapy; Problems influencing health status; Spondylosis without myelopathy or radiculopathy, lumbosacral region; Chronic hip pain [facility] Area of Pain) (Bilateral) (R>L); Chronic shoulder pain (Bilateral) (L>R); Grade 1 Anterolisthesis of L5 over S1 & L3 over L4; Grade 1 Retrolisthesis L1 over L2 & L2 over L3; DDD (degenerative disc disease), lumbar; 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 Her primarily concern today is the Foot Pain  Pain Assessment: Location: Lower Back Radiating: pain in feet, improved in my legs Onset: More than a month ago Duration: Chronic pain Quality: Aching Severity: 5 /10 (subjective, self-reported pain score)  Note: Reported level is inconsistent with clinical observations. Clinically the patient looks like a 2/10 A 2/10 is viewed as "Mild to Moderate" and described as noticeable and distracting. Impossible to hide from other people. More frequent flare-ups. Still possible to adapt and function close to normal. It can be very annoying and may have occasional stronger flare-ups. With discipline, patients may get used to it and adapt. Candice Guzman continues to use a standard subjective pain scale, rather than an objective pain scale as instructed. When using our objective Pain Scale, levels between 6 and 10/10 are said to belong in an emergency room, as it progressively worsens from a 6/10, described as severely limiting, requiring emergency care not usually available at an outpatient pain management facility. At a 6/10 level, communication becomes difficult and requires great effort. Assistance to reach the emergency department may be required. Facial flushing and profuse sweating along with potentially dangerous increases in heart rate and blood pressure will be evident. Effect on ADL: better Timing: Constant Modifying factors: lying down and getting off my feet BP: 138/74  HR: 86  Candice Guzman comes in today for post-procedure evaluation.  The patient seems to have obtained significant benefit from the bilateral L5 transforaminal ESI to the point were the only thing that is left is pain in her right foot with some evidence of foot drop suggestive  of a persistent right-sided L5 radiculopathy.  See below for treatment plan.  Further  details on both, my assessment(s), as well as the proposed treatment plan, please see below.  Post-Procedure Assessment  07/20/2018 Procedure: Diagnostic bilateral L5 transforaminal ESI#2under fluoroscopic guidance and IV sedation  Pre-procedure pain score:  6/10 Post-procedure pain score: 0/10 (100% relief) Influential Factors: BMI: 22.31 kg/m Intra-procedural challenges: None observed.         Assessment challenges: None detected.              Reported side-effects: None.        Post-procedural adverse reactions or complications: None reported         Sedation: Sedation provided. When no sedatives are used, the analgesic levels obtained are directly associated to the effectiveness of the local anesthetics. However, when sedation is provided, the level of analgesia obtained during the initial 1 hour following the intervention, is believed to be the result of a combination of factors. These factors may include, but are not limited to: 1. The effectiveness of the local anesthetics used. 2. The effects of the analgesic(s) and/or anxiolytic(s) used. 3. The degree of discomfort experienced by the patient at the time of the procedure. 4. The patients ability and reliability in recalling and recording the events. 5. The presence and influence of possible secondary gains and/or psychosocial factors. Reported result: Relief experienced during the 1st hour after the procedure: 100 % (Ultra-Short Term Relief)            Interpretative annotation: Clinically appropriate result. Analgesia during this period is likely to be Local Anesthetic and/or IV Sedative (Analgesic/Anxiolytic) related.          Effects of local anesthetic: The analgesic effects attained during this period are directly associated to the localized infiltration of local anesthetics and therefore cary significant diagnostic value as to the etiological  location, or anatomical origin, of the pain. Expected duration of relief is directly dependent on the pharmacodynamics of the local anesthetic used. Long-acting (4-6 hours) anesthetics used.  Reported result: Relief during the next 4 to 6 hour after the procedure: 100 % (Short-Term Relief)            Interpretative annotation: Clinically appropriate result. Analgesia during this period is likely to be Local Anesthetic-related.          Long-term benefit: Defined as the period of time past the expected duration of local anesthetics (1 hour for short-acting and 4-6 hours for long-acting). With the possible exception of prolonged sympathetic blockade from the local anesthetics, benefits during this period are typically attributed to, or associated with, other factors such as analgesic sensory neuropraxia, antiinflammatory effects, or beneficial biochemical changes provided by agents other than the local anesthetics.  Reported result: Extended relief following procedure: 100 %(pain free for 5 days, pain is only in my feet and back now) (Long-Term Relief)            Interpretative annotation: Clinically possible results. Good relief. No permanent benefit expected. Inflammation plays a part in the etiology to the pain.          Current benefits: Defined as reported results that persistent at this point in time.   Analgesia: 75-100 % The patient indicates that her low back pain and leg pain are 100% gone.  However, she continues to have some pain in her foot. Function: Ms. Paisley reports improvement in function ROM: Ms. Seefeld reports improvement in ROM Interpretative annotation: Ongoing benefit. Therapeutic success. Effective therapeutic approach. She continues to have some pain in her right foot and the physical exam today  shows a foot drop with weakness on the L5 distribution.  Interpretation: Results would suggest a successful diagnostic intervention.                  Plan:  At this point the plan is to  bring the patient back for a right-sided L4-5 interlaminar lumbar epidural steroid injection to treat the spinal stenosis and lateral recess stenosis on the right side which could be affecting the L5 at that level.  In addition, I will also do a right-sided L5 transforaminal LESI to treat the foraminal stenosis at the L5-S1 level which could also be another site where the L5 may be affected.                Laboratory Chemistry  Inflammation Markers (CRP: Acute Phase) (ESR: Chronic Phase) Lab Results  Component Value Date   CRP 2.2 07/08/2017   ESRSEDRATE CANCELED 07/08/2017                         Rheumatology Markers Lab Results  Component Value Date   LABURIC 2.5 07/08/2017                        Renal Markers Lab Results  Component Value Date   BUN 11 07/08/2017   CREATININE 0.66 07/08/2017   BCR 17 07/08/2017   GFRAA 114 07/08/2017   GFRNONAA 99 07/08/2017                             Hepatic Markers Lab Results  Component Value Date   AST 17 07/08/2017   ALBUMIN 4.4 07/08/2017                        Neuropathy Markers Lab Results  Component Value Date   VITAMINB12 310 07/08/2017   HGBA1C 5.10 02/11/2016                        Hematology Parameters No results found.  CV Markers No results found.  Note: Lab results reviewed.  Recent Imaging Results   Results for orders placed in visit on 07/20/18  DG C-Arm 1-60 Min-No Report   Narrative Fluoroscopy was utilized by the requesting physician.  No radiographic  interpretation.    Interpretation Report: Fluoroscopy was used during the procedure to assist with needle guidance. The images were interpreted intraoperatively by the requesting physician.  Meds   Current Outpatient Medications:  .  ALPRAZolam (XANAX) 0.5 MG tablet, TAKE 1 TABLET BY MOUTH EVERY NIGHT AT BEDTIME AS NEEDED, Disp: 30 tablet, Rfl: 0 .  aluminum chloride (DRYSOL) 20 % external solution, Drysol Dab-O-Matic 20 % topical solution, Disp: ,  Rfl:  .  cloNIDine (CATAPRES) 0.1 MG tablet, TAKE 1 TABLET BY MOUTH TWO  TIMES DAILY, Disp: 180 tablet, Rfl: 0 .  DULoxetine (CYMBALTA) 60 MG capsule, Take 1 capsule (60 mg total) by mouth daily., Disp: 90 capsule, Rfl: 1 .  estradiol (ESTRACE) 0.1 MG/GM vaginal cream, Place 1 Applicatorful vaginally at bedtime. Insert 1g nightly for 1 wk, then 1 g once weekly as maintenace, Disp: 42.5 g, Rfl: 1 .  HUMIRA PEN 40 MG/0.8ML PNKT, Inject 40 mg into the skin 2 (two) times a week., Disp: , Rfl: 3 .  meloxicam (MOBIC) 7.5 MG tablet, Take 7.5 mg by mouth daily., Disp: , Rfl:  .  QUEtiapine (SEROQUEL) 25 MG tablet, Take 1 tablet (25 mg total) by mouth at bedtime., Disp: 30 tablet, Rfl: 0 .  tiZANidine (ZANAFLEX) 4 MG tablet, TAKE 1 TABLET BY MOUTH  EVERY 6 HOURS AS NEEDED FOR MUSCLE SPASM(S), Disp: 120 tablet, Rfl: 0 .  traMADol (ULTRAM) 50 MG tablet, Take 1 tablet (50 mg total) by mouth every 12 (twelve) hours as needed for severe pain., Disp: 60 tablet, Rfl: 2 .  zolpidem (AMBIEN CR) 12.5 MG CR tablet, TAKE 1 TABLET BY MOUTH EVERY NIGHT AT BEDTIME, Disp: 90 tablet, Rfl: 0 .  gabapentin (NEURONTIN) 100 MG capsule, Take 1-3 capsules (100-300 mg total) by mouth at bedtime for 30 days. Follow written titration schedule., Disp: 90 capsule, Rfl: 0  ROS  Constitutional: Denies any fever or chills Gastrointestinal: No reported hemesis, hematochezia, vomiting, or acute GI distress Musculoskeletal: Denies any acute onset joint swelling, redness, loss of ROM, or weakness Neurological: No reported episodes of acute onset apraxia, aphasia, dysarthria, agnosia, amnesia, paralysis, loss of coordination, or loss of consciousness  Allergies  Ms. Gulden is allergic to codeine and methotrexate.  PFSH  Drug: Ms. Miles  reports no history of drug use. Alcohol:  reports no history of alcohol use. Tobacco:  reports that she has never smoked. She has never used smokeless tobacco. Medical:  has a past medical history of  Depression, Insomnia, Lumbar herniated disc, Menopause, Rheumatoid arthritis ([place]), and Vitamin D deficiency. Surgical: Ms. Vokes  has a past surgical history that includes Ganglion cyst excision; Cesarean section; Breast biopsy (Left, 2010); Metacarpophalangeal joint arthroplasty (Right, 06/06/2016); and Finger arthroplasty (Left, 07/24/2017). Family: family history includes Cancer in her brother; Cancer (age of onset: 54) in her father; Emphysema in her mother.  Constitutional Exam  General appearance: Well nourished, well developed, and well hydrated. In no apparent acute distress Vitals:   08/04/18 0814  BP: 138/74  Pulse: 86  Temp: 98.6 F (37 C)  SpO2: 100%  Weight: 130 lb (59 kg)  Height: _0  (1.626 m)   BMI Assessment: Estimated body mass index is 22.31 kg/m as calculated from the following:   Height as of this encounter: _1  (1.626 m).   Weight as of this encounter: 130 lb (59 kg).  BMI interpretation table: BMI level Category Range association with higher incidence of chronic pain  <18 kg/m2 Underweight   18.5-24.9 kg/m2 Ideal body weight   25-29.9 kg/m2 Overweight Increased incidence by 20%  30-34.9 kg/m2 Obese (Class I) Increased incidence by 68%  35-39.9 kg/m2 Severe obesity (Class II) Increased incidence by 136%  >40 kg/m2 Extreme obesity (Class III) Increased incidence by 254%   Patient's current BMI Ideal Body weight  Body mass index is 22.31 kg/m. Ideal body weight: 54.7 kg (120 lb 9.5 oz) Adjusted ideal body weight: 56.4 kg (124 lb 5.7 oz)   BMI Readings from Last 4 Encounters:  08/04/18 22.31 kg/m  07/20/18 21.97 kg/m  06/07/18 21.97 kg/m  05/26/18 21.78 kg/m   Wt Readings from Last 4 Encounters:  08/04/18 130 lb (59 kg)  07/20/18 128 lb (58.1 kg)  06/07/18 128 lb (58.1 kg)  05/26/18 126 lb 14.4 oz (57.6 kg)  Psych/Mental status: Alert, oriented x 3 (person, place, & time)       Eyes: PERLA Respiratory: No evidence of acute respiratory  distress  Cervical Spine Area Exam  Skin & Axial Inspection: No masses, redness, edema, swelling, or associated skin lesions Alignment: Symmetrical Functional ROM: Unrestricted ROM  Stability: No instability detected Muscle Tone/Strength: Functionally intact. No obvious neuro-muscular anomalies detected. Sensory (Neurological): Unimpaired Palpation: No palpable anomalies              Upper Extremity (UE) Exam    Side: Right upper extremity  Side: Left upper extremity  Skin & Extremity Inspection: Skin color, temperature, and hair growth are WNL. No peripheral edema or cyanosis. No masses, redness, swelling, asymmetry, or associated skin lesions. No contractures.  Skin & Extremity Inspection: Skin color, temperature, and hair growth are WNL. No peripheral edema or cyanosis. No masses, redness, swelling, asymmetry, or associated skin lesions. No contractures.  Functional ROM: Unrestricted ROM          Functional ROM: Unrestricted ROM          Muscle Tone/Strength: Functionally intact. No obvious neuro-muscular anomalies detected.  Muscle Tone/Strength: Functionally intact. No obvious neuro-muscular anomalies detected.  Sensory (Neurological): Unimpaired          Sensory (Neurological): Unimpaired          Palpation: No palpable anomalies              Palpation: No palpable anomalies              Provocative Test(s):  Phalen's test: deferred Tinel's test: deferred Apley's scratch test (touch opposite shoulder):  Action 1 (Across chest): deferred Action 2 (Overhead): deferred Action 3 (LB reach): deferred   Provocative Test(s):  Phalen's test: deferred Tinel's test: deferred Apley's scratch test (touch opposite shoulder):  Action 1 (Across chest): deferred Action 2 (Overhead): deferred Action 3 (LB reach): deferred    Thoracic Spine Area Exam  Skin & Axial Inspection: No masses, redness, or swelling Alignment: Symmetrical Functional ROM: Unrestricted ROM Stability: No  instability detected Muscle Tone/Strength: Functionally intact. No obvious neuro-muscular anomalies detected. Sensory (Neurological): Unimpaired Muscle strength & Tone: No palpable anomalies  Lumbar Spine Area Exam  Skin & Axial Inspection: No masses, redness, or swelling Alignment: Symmetrical Functional ROM: Unrestricted ROM       Stability: No instability detected Muscle Tone/Strength: Functionally intact. No obvious neuro-muscular anomalies detected. Sensory (Neurological): Unimpaired Palpation: No palpable anomalies       Provocative Tests: Hyperextension/rotation test: deferred today       Lumbar quadrant test (Kemp's test): deferred today       Lateral bending test: deferred today       Patrick's Maneuver: deferred today                   FABER* test: deferred today                   S-I anterior distraction/compression test: deferred today         S-I lateral compression test: deferred today         S-I Thigh-thrust test: deferred today         S-I Gaenslen's test: deferred today         *(Flexion, ABduction and External Rotation)  Gait & Posture Assessment  Ambulation: Unassisted Gait: Relatively normal for age and body habitus Posture: WNL   Lower Extremity Exam    Side: Right lower extremity  Side: Left lower extremity  Stability: No instability observed          Stability: No instability observed          Skin & Extremity Inspection: Skin color, temperature, and hair growth are WNL. No peripheral edema or cyanosis. No masses, redness, swelling, asymmetry, or associated skin  lesions. No contractures.  Skin & Extremity Inspection: Skin color, temperature, and hair growth are WNL. No peripheral edema or cyanosis. No masses, redness, swelling, asymmetry, or associated skin lesions. No contractures.  Functional ROM: Unrestricted ROM                  Functional ROM: Unrestricted ROM                  Muscle Tone/Strength: L5 myotomal (EHL) weakness (Toe Dorsiflexion)  Muscle  Tone/Strength: Able to Toe-walk & Heel-walk without problems  Sensory (Neurological): Unimpaired        Sensory (Neurological): Unimpaired        DTR: Patellar: 2+: normal Achilles: 2+: normal Plantar: deferred today  DTR: Patellar: 2+: normal Achilles: 2+: normal Plantar: deferred today  Palpation: No palpable anomalies  Palpation: No palpable anomalies   Assessment  Primary Diagnosis & Pertinent Problem List: The primary encounter diagnosis was Chronic lower extremity pain (Primary Area of Pain) (Bilateral) (R>L). 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  Status Diagnosis  Improved Resolved Resolved 1. Chronic lower extremity pain (Primary Area of Pain) (Bilateral) (R>L)   2. Chronic low back pain (Secondary Area of Pain) (Bilateral) (R>L)   3. Chronic hip pain (Tertiary Area of Pain) (Bilateral) (R>L)   4. Chronic knee pain (Fourth Area of Pain) (Bilateral)   5. 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 Chronic wrist pain (Fifth Area of Pain) (Bilateral)   7. Grade 1 Retrolisthesis L1 over L2 & L2 over L3   8. Grade 1 Anterolisthesis of L5 over S1 & L3 over L4   9. DDD (degenerative disc disease), lumbar   10. 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 Neurogenic pain   12. Lumbosacral radiculopathy (L5) (Right)    13. Foot drop (Right)   14. Abnormal MRI, lumbar spine (06/21/2018)     Problems updated and reviewed during this visit: Problem  Foot drop (Right)   Due to L5 radiculopathy.   Abnormal MRI, lumbar spine (06/21/2018)   FINDINGS: Alignment: New trace retrolisthesis of L1 on L2 and trace anterolisthesis of L3 on L4. Unchanged alignment elsewhere with at most trace retrolisthesis of L2 on L3 and of L5 on S1.  Vertebrae: No fracture or suspicious osseous lesion. Increased, mild edema along the L1 inferior endplate, likely degenerative. Mild type 1 and type 2 degenerative endplate changes at R1-V4. Scattered small vertebral hemangiomas.  Paraspinal and other soft tissues: 5 mm T2 hyperintense lesion in the posterior right hepatic lobe, possibly a small cyst or hemangioma. Left renal sinus cysts.  Disc levels: Disc desiccation throughout the lumbar spine, progressed from prior. L1-2: New moderate disc space narrowing. New disc bulging greater to the right and slight facet hypertrophy result in mild right lateral recess and mild right neural foraminal stenosis without spinal stenosis. L2-3: Slight facet hypertrophy and at most minimal disc bulging  without stenosis. L3-4: Progressive mild disc space narrowing. New anterolisthesis with bulging uncovered disc and moderate facet and ligamentum flavum hypertrophy result in increased, mild left greater than right lateral recess stenosis, borderline spinal stenosis, and minimal left neural foraminal stenosis. L4-5: New mild disc space narrowing. Increased circumferential disc bulging, a new small right subarticular disc protrusion, and moderate facet and ligamentum flavum hypertrophy result in new mild spinal stenosis and moderate bilateral lateral recess stenosis potentially affecting the L5 nerve roots. No significant neural foraminal stenosis. L5-S1: Progressive moderate disc space narrowing. Circumferential disc bulging, endplate spurring, disc space  height loss, and mild facet hypertrophy result in moderate right and mild-to-moderate left neural foraminal stenosis, not significantly changed. A central disc protrusion has regressed. No spinal stenosis.  IMPRESSION: 1. Progressive disc degeneration at multiple levels. 2. New mild spinal stenosis and moderate bilateral lateral recess stenosis at L4-5. 3. Mild right lateral recess and right neural foraminal stenosis at L1-2. 4. Chronic moderate neural foraminal stenosis at L5-S1.  Electronically Signed   By: Logan Bores M.D.   On: 06/21/2018 09:18   Lumbosacral radiculopathy (L5) (Right)   Plan of Care  Pharmacotherapy (Medications Ordered): Meds ordered this encounter  Medications  . gabapentin (NEURONTIN) 100 MG capsule    Sig: Take 1-3 capsules (100-300 mg total) by mouth at bedtime for 30 days. Follow written titration schedule.    Dispense:  90 capsule    Refill:  0    Do not place medication on "Automatic Refill". Fill one day early if pharmacy is closed on scheduled refill date.   Medications administered today: Cephus Shelling. Castles had no medications administered during this visit.   Procedure Orders     Lumbar Epidural Injection     Lumbar Transforaminal Epidural Lab Orders  No laboratory test(s) ordered today   Imaging Orders  No imaging studies ordered today   Referral Orders  No referral(s) requested today   Interventional management options: Planned, scheduled, and/or pending:   Therapeutic right-sided L4-5 interlaminar LESI under fluoroscopic guidance, no sedation Diagnostic right L5 transforaminal ESI#3under fluoroscopic guidance no sedation    Considering:   Diagnostic bilateral L5 transforaminal LESI Diagnostic bilateral lumbar facet block Possible bilateral lumbar facet RFA Diagnostic bilateral intra-articular hip joint injection Diagnostic bilateral femoral nerve block + obturator nerve block Possible bilateral femoral nerve + obturator  nerve RFA Diagnostic bilateral intra-articular shoulder joint injection Diagnostic bilateral suprascapular nerve block Possible bilateral suprascapular nerve RFA Diagnostic Bilateral intra-articular wrist injections Diagnosticbilateral intra-articular knee injections Possible bilateral intra-articular Hyalgan knee injection Diagnostic bilateral genicular nerve block Possible bilateral genicular nerve RFA   Palliative PRN treatment(s):   Palliative/therapeutic bilateral L5 transforaminal ESI#3under fluoroscopic guidance and IV sedation    Provider-requested follow-up: Return for Procedure (no sedation): (R) L5 TFESI + (R) L4-5 LESI.  Future Appointments  Date Time Provider [place]  08/27/2018  3:40 PM Steele Sizer, MD [place] [facility]  12/07/2018  2:30 PM Vevelyn Francois, NP [facility] None   Primary Care Physician: Steele Sizer, MD Location: [facility] Note by: Gaspar Cola, MD Date: 08/04/2018; Time: 9:45 AM

## 2018-08-04 ENCOUNTER — Other Ambulatory Visit: Payer: Self-pay

## 2018-08-04 ENCOUNTER — Ambulatory Visit: Payer: Managed Care, Other (non HMO) | Attending: Pain Medicine | Admitting: Pain Medicine

## 2018-08-04 ENCOUNTER — Encounter: Payer: Self-pay | Admitting: Pain Medicine

## 2018-08-04 VITALS — BP 138/74 | HR 86 | Temp 98.6°F | Ht 64.0 in | Wt 130.0 lb

## 2018-08-04 DIAGNOSIS — M25532 Pain in left wrist: Secondary | ICD-10-CM | POA: Diagnosis present

## 2018-08-04 DIAGNOSIS — M25552 Pain in left hip: Secondary | ICD-10-CM | POA: Diagnosis present

## 2018-08-04 DIAGNOSIS — M5416 Radiculopathy, lumbar region: Secondary | ICD-10-CM | POA: Diagnosis present

## 2018-08-04 DIAGNOSIS — M792 Neuralgia and neuritis, unspecified: Secondary | ICD-10-CM

## 2018-08-04 DIAGNOSIS — M25531 Pain in right wrist: Secondary | ICD-10-CM

## 2018-08-04 DIAGNOSIS — M21371 Foot drop, right foot: Secondary | ICD-10-CM | POA: Diagnosis present

## 2018-08-04 DIAGNOSIS — R937 Abnormal findings on diagnostic imaging of other parts of musculoskeletal system: Secondary | ICD-10-CM

## 2018-08-04 DIAGNOSIS — M5441 Lumbago with sciatica, right side: Secondary | ICD-10-CM

## 2018-08-04 DIAGNOSIS — M79605 Pain in left leg: Secondary | ICD-10-CM | POA: Diagnosis present

## 2018-08-04 DIAGNOSIS — M48061 Spinal stenosis, lumbar region without neurogenic claudication: Secondary | ICD-10-CM

## 2018-08-04 DIAGNOSIS — M25562 Pain in left knee: Secondary | ICD-10-CM

## 2018-08-04 DIAGNOSIS — M25561 Pain in right knee: Secondary | ICD-10-CM | POA: Diagnosis not present

## 2018-08-04 DIAGNOSIS — M5442 Lumbago with sciatica, left side: Secondary | ICD-10-CM

## 2018-08-04 DIAGNOSIS — M5417 Radiculopathy, lumbosacral region: Secondary | ICD-10-CM | POA: Diagnosis present

## 2018-08-04 DIAGNOSIS — M5136 Other intervertebral disc degeneration, lumbar region: Secondary | ICD-10-CM | POA: Diagnosis present

## 2018-08-04 DIAGNOSIS — G8929 Other chronic pain: Secondary | ICD-10-CM

## 2018-08-04 DIAGNOSIS — M79604 Pain in right leg: Secondary | ICD-10-CM

## 2018-08-04 DIAGNOSIS — M4310 Spondylolisthesis, site unspecified: Secondary | ICD-10-CM

## 2018-08-04 DIAGNOSIS — M25551 Pain in right hip: Secondary | ICD-10-CM

## 2018-08-04 MED ORDER — GABAPENTIN 100 MG PO CAPS: 100.0000 mg | ORAL_CAPSULE | Freq: Every day | ORAL | 0 refills | Status: DC

## 2018-08-04 NOTE — Patient Instructions (Addendum)
____________________________________________________________________________________________  Muscle Spasms & Cramps  Cause:  The most common cause of muscle spasms and cramps is vitamin and/or electrolyte (calcium, potassium, sodium, etc.) deficiencies.  Possible triggers: Sweating - causes loss of electrolytes thru the skin. Steroids - causes loss of electrolytes thru the urine.  Treatment: 1. Gatorade (or any other electrolyte-replenishing drink) - Take 1, 8 oz glass with each meal (3 times a day). 2. OTC (over-the-counter) Magnesium 400 to 500 mg - Take 1 tablet twice a day (one with breakfast and one before bedtime). If you have kidney problems, talk to your primary care physician before taking any Magnesium. 3. Tonic Water with quinine - Take 1, 8 oz glass before bedtime.   ____________________________________________________________________________________________   ____________________________________________________________________________________________  Preparing for your procedure (without sedation)  Instructions: . Oral Intake: Do not eat or drink anything for at least 3 hours prior to your procedure. . Transportation: Unless otherwise stated by your physician, you may drive yourself after the procedure. . Blood Pressure Medicine: Take your blood pressure medicine with a sip of water the morning of the procedure. . Blood thinners: Notify our staff if you are taking any blood thinners. Depending on which one you take, there will be specific instructions on how and when to stop it. . Diabetics on insulin: Notify the staff so that you can be scheduled 1st case in the morning. If your diabetes requires high dose insulin, take only  of your normal insulin dose the morning of the procedure and notify the staff that you have done so. . Preventing infections: Shower with an antibacterial soap the morning of your procedure.  . Build-up your immune system: Take 1000 mg of Vitamin C  with every meal (3 times a day) the day prior to your procedure. Marland Kitchen Antibiotics: Inform the staff if you have a condition or reason that requires you to take antibiotics before dental procedures. . Pregnancy: If you are pregnant, call and cancel the procedure. . Sickness: If you have a cold, fever, or any active infections, call and cancel the procedure. . Arrival: You must be in the facility at least 30 minutes prior to your scheduled procedure. . Children: Do not bring any children with you. . Dress appropriately: Bring dark clothing that you would not mind if they get stained. . Valuables: Do not bring any jewelry or valuables.  Procedure appointments are reserved for interventional treatments only. Marland Kitchen No Prescription Refills. . No medication changes will be discussed during procedure appointments. . No disability issues will be discussed.  Reasons to call and reschedule or cancel your procedure: (Following these recommendations will minimize the risk of a serious complication.) . Surgeries: Avoid having procedures within 2 weeks of any surgery. (Avoid for 2 weeks before or after any surgery). . Flu Shots: Avoid having procedures within 2 weeks of a flu shots or . (Avoid for 2 weeks before or after immunizations). . Barium: Avoid having a procedure within 7-10 days after having had a radiological study involving the use of radiological contrast. (Myelograms, Barium swallow or enema study). . Heart attacks: Avoid any elective procedures or surgeries for the initial 6 months after a "Myocardial Infarction" (Heart Attack). . Blood thinners: It is imperative that you stop these medications before procedures. Let us know if you if you take any blood thinner.  . Infection: Avoid procedures during or within two weeks of an infection (including chest colds or gastrointestinal problems). Symptoms associated with infections include: Localized redness, fever, chills, night sweats or profuse  sweating,  burning sensation when voiding, cough, congestion, stuffiness, runny nose, sore throat, diarrhea, nausea, vomiting, cold or Flu symptoms, recent or current infections. It is specially important if the infection is over the area that we intend to treat. Marland Kitchen Heart and lung problems: Symptoms that may suggest an active cardiopulmonary problem include: cough, chest pain, breathing difficulties or shortness of breath, dizziness, ankle swelling, uncontrolled high or unusually low blood pressure, and/or palpitations. If you are experiencing any of these symptoms, cancel your procedure and contact your primary care physician for an evaluation.  Remember:  Regular Business hours are:  Monday to Thursday 8:00 AM to 4:00 PM  Provider's Schedule: Delano Metz, MD:  Procedure days: Tuesday and Thursday 7:30 AM to 4:00 PM  Edward Jolly, MD:  Procedure days: Monday and Wednesday 7:30 AM to 4:00 PM ____________________________________________________________________________________________   ____________________________________________________________________________________________  Initial Gabapentin Titration  Medication used: Gabapentin (Generic Name) or Neurontin (Brand Name) 100 mg tablets/capsules  Reasons to stop increasing the dose:  Reason 1: You get good relief of symptoms, in which case there is no need to increase the daily dose any further.    Reason 2: You develop some side effects, such as sleeping all of the time, difficulty concentrating, or becoming disoriented, in which case you need to go down on the dose, to the prior level, where you were not experiencing any side effects. Stay on that dose longer, to allow more time for your body to get use it, before attempting to increase it again.   Steps: Step 1: Start by taking 1 (one) tablet at bedtime x 7 (seven) days.  Step 2: After being on 1 (one) tablet for 7 (seven) days, then increase it to 2 (two) tablets at bedtime for another  7 (seven) days.  Step 3: Next, after being on 2 (two) tablets at bedtime for 7 (seven) days, then increase it to 3 (three) tablets at bedtime, and stay on that dose until you see your doctor.  Reasons to stop increasing the dose: Reason 1: You get good relief of symptoms, in which case there is no need to increase the daily dose any further.  Reason 2: You develop some side effects, such as sleeping all of the time, difficulty concentrating, or becoming disoriented, in which case you need to go down on the dose, to the prior level, where you were not experiencing any side effects. Stay on that dose longer, to allow more time for your body to get use it, before attempting to increase it again.  Endpoint: Once you have reached the maximum dose you can tolerate without side-effects, contact your physician so as to evaluate the results of the regimen.   Questions: Feel free to contact us for any questions or problems at [phone] ____________________________________________________________________________________________

## 2018-08-27 ENCOUNTER — Ambulatory Visit: Payer: Managed Care, Other (non HMO) | Admitting: Family Medicine

## 2018-08-27 ENCOUNTER — Encounter: Payer: Self-pay | Admitting: Family Medicine

## 2018-08-27 VITALS — BP 172/100 | HR 85 | Temp 98.2°F | Resp 16 | Ht 64.0 in | Wt 131.9 lb

## 2018-08-27 DIAGNOSIS — K743 Primary biliary cirrhosis: Secondary | ICD-10-CM

## 2018-08-27 DIAGNOSIS — N951 Menopausal and female climacteric states: Secondary | ICD-10-CM

## 2018-08-27 DIAGNOSIS — M79642 Pain in left hand: Secondary | ICD-10-CM | POA: Diagnosis not present

## 2018-08-27 DIAGNOSIS — M0600 Rheumatoid arthritis without rheumatoid factor, unspecified site: Secondary | ICD-10-CM

## 2018-08-27 DIAGNOSIS — G4700 Insomnia, unspecified: Secondary | ICD-10-CM

## 2018-08-27 DIAGNOSIS — R030 Elevated blood-pressure reading, without diagnosis of hypertension: Secondary | ICD-10-CM

## 2018-08-27 DIAGNOSIS — M358 Other specified systemic involvement of connective tissue: Secondary | ICD-10-CM | POA: Diagnosis not present

## 2018-08-27 DIAGNOSIS — M545 Low back pain, unspecified: Secondary | ICD-10-CM

## 2018-08-27 DIAGNOSIS — F331 Major depressive disorder, recurrent, moderate: Secondary | ICD-10-CM | POA: Diagnosis not present

## 2018-08-27 DIAGNOSIS — G8929 Other chronic pain: Secondary | ICD-10-CM

## 2018-08-27 MED ORDER — HYDROCODONE-ACETAMINOPHEN 10-325 MG PO TABS: 1.0000 | ORAL_TABLET | Freq: Three times a day (TID) | ORAL | 0 refills | Status: DC | PRN

## 2018-08-27 MED ORDER — ESZOPICLONE 2 MG PO TABS: 2.0000 mg | ORAL_TABLET | Freq: Every evening | ORAL | 0 refills | Status: DC | PRN

## 2018-08-27 NOTE — Progress Notes (Signed)
Name: Candice Guzman   MRN: [id number]    DOB: 1961-02-11   Date:08/27/2018       Progress Note  Subjective  Chief Complaint  Chief Complaint  Patient presents with  . Depression  . Hand Pain    HPI  SeronegativeRheumatoidArthritis: She has been off Simponi forover one [employer] stopped paying for it. She is back on Humiraand since [id number] off methotrexate because of hair loss. She is still under the care of Dr. Gavin Potters. Acute hand pain past couple of weeks. Likely a flare  Insomnia: able to sleep with Ambien CR , able to fall and stay asleep with medication.Medication is not covered , she has been taking some otc supplementation she has been sleeping for about 2 hours with the medication, we will try lunesta, advised not to take with pain medication because of risk of respiratory suppression   Major Depression:she tried Effexor and Duloxetine, but it did not seem to improve depression symptoms. She states mood is affected by constant pain and also the hot flashes and the fact that she feels like she smells. She denies suicidal thoughts or ideation. Always tired, we tried switching to Sertraline on her last visit but she was unable to tolerate medication and it caused her to feel angry, she called back and resumed Duloxetine , we tried Seroquel at night but caused her to not rest and had weird dreams. She was just recently started on Gabapentin at night and hopefully it will decrease pain and help with her mood. Discussed therapy again  Acute left hand pain: different than her usual flares, located on left MCP joint, with swelling. She states pain is intense 9/10. She called Dr. Gavin Potters and he gave her a 5 day prednisone taper but symptoms did not improve. She denies trauma. Advised to follow up with him ASAP we will send a short course of hydrocodone  Menopause: she is now on Clonidine she is also using a topical drysol on axillar - prescribed by Dermatologist. She  still feels hot but no longer sweaty since she started Drysol. Under control now   Back pain: she states she has a constant lower back tightness ,she saw Dr. Joanna Puff for back injections for DDD but insurance denied any more coverage. Pain right now is 3/10, but goes up to 8 when standing. No radiculitis No bowel or bladder incontinence.She gets Tramadol from him.   Abnormal sense of taste and smell: saw by ENT and advised to have MRI, she states she is worried about cost. The sense has improved significantly but still has lack of taste, feeling better about it    Patient Active Problem List   Diagnosis Date Noted  . Moderate episode of recurrent major depressive disorder (HCC) 08/27/2018  . Foot drop (Right) 08/04/2018  . Abnormal MRI, lumbar spine (06/21/2018) 08/04/2018  . Chronic lumbar radicular pain (L5 dermatome) (Bilateral) (R>L) 05/26/2018  . Long term prescription benzodiazepine use 09/21/2017  . Chronic lower extremity pain (Primary Area of Pain) (Bilateral) (R>L) 09/21/2017  . Chronic low back pain (Secondary Area of Pain) (Bilateral) (R>L) 09/21/2017  . Pharmacologic therapy 09/21/2017  . Problems influencing health status 09/21/2017  . Spondylosis without myelopathy or radiculopathy, lumbosacral region 09/21/2017  . Chronic hip pain [facility] Area of Pain) (Bilateral) (R>L) 09/21/2017  . Chronic shoulder pain (Bilateral) (L>R) 09/21/2017  . Grade 1 Anterolisthesis of L5 over S1 & L3 over L4 09/21/2017  . Grade 1 Retrolisthesis L1 over L2 & L2 over L3 09/21/2017  .  DDD (degenerative disc disease), lumbar 09/21/2017  . 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 Lumbar facet joint syndrome (Bilateral) (R>L) 09/21/2017  . Chronic musculoskeletal pain 09/21/2017  . Neurogenic pain 09/21/2017  . Lumbar foraminal stenosis (L5-S1) (Bilateral) 09/21/2017  . Lumbar lateral recess stenosis (Bilateral) (B: L4-5) (L>R: L3-4) 09/21/2017  . Lumbar facet  hypertrophy (Bilateral: L3-4, L4-5, and L5-S1) 09/21/2017  . Chronic pain syndrome 07/08/2017  . Chronic knee pain (Fourth Area of Pain) (Bilateral) 07/08/2017  . Chronic wrist pain (Fifth Area of Pain) (Bilateral) 07/08/2017  . Disorder of skeletal system 07/08/2017  . Other long term (current) drug therapy 07/08/2017  . Long term current use of opiate analgesic 07/08/2017  . Other specified health status 07/08/2017  . Chronic pain of multiple joints 05/20/2017  . Insomnia, persistent 12/18/2014  . Depression, major, single episode, moderate (HCC) 12/18/2014  . Menopausal symptom 12/18/2014  . Vitamin D deficiency 12/18/2014  . Inflammatory arthritis (HCC) 02/23/2014  . Lumbosacral radiculopathy (L5) (Right) 12/12/2013  . Bulge of lumbar disc without myelopathy 06/18/2011    Past Surgical History:  Procedure Laterality Date  . BREAST BIOPSY Left 2010   neg  . CESAREAN SECTION    . FINGER ARTHROPLASTY Left 07/24/2017   Thumb  . GANGLION CYST EXCISION    . METACARPOPHALANGEAL JOINT ARTHROPLASTY Right 06/06/2016   Dr. Dayna BarkerAldridge     Family History  Problem Relation Age of Onset  . Emphysema Mother   . Cancer Father 60       lung  . Cancer Brother        tongue    Social History   Socioeconomic History  . Marital status: Divorced    Spouse name: Not on file  . Number of children: 1  . Years of education: Not on file  . Highest education level: High school graduate  Occupational History  . Not on file  Social Needs  . Financial resource strain: Somewhat hard  . Food insecurity:    Worry: Never true    Inability: Never true  . Transportation needs:    Medical: No    Non-medical: No  Tobacco Use  . Smoking status: Never Smoker  . Smokeless tobacco: Never Used  Substance and Sexual Activity  . Alcohol use: No    Alcohol/week: 0.0 standard drinks  . Drug use: No  . Sexual activity: Yes    Partners: Male  Lifestyle  . Physical activity:    Days per week: 0  days    Minutes per session: 0 min  . Stress: Very much  Relationships  . Social connections:    Talks on phone: More than three times a week    Gets together: Never    Attends religious service: Never    Active member of club or organization: No    Attends meetings of clubs or organizations: Never    Relationship status: Divorced  . Intimate partner violence:    Fear of current or ex partner: No    Emotionally abused: No    Physically abused: No    Forced sexual activity: No  Other Topics Concern  . Not on file  Social History Narrative  . Not on file     Current Outpatient Medications:  .  ALPRAZolam (XANAX) 0.5 MG tablet, TAKE 1 TABLET BY MOUTH EVERY NIGHT AT BEDTIME AS NEEDED, Disp: 30 tablet, Rfl: 0 .  aluminum chloride (DRYSOL) 20 % external solution, Drysol Dab-O-Matic 20 % topical solution,  Disp: , Rfl:  .  cloNIDine (CATAPRES) 0.1 MG tablet, TAKE 1 TABLET BY MOUTH TWO  TIMES DAILY, Disp: 180 tablet, Rfl: 0 .  DULoxetine (CYMBALTA) 60 MG capsule, Take 1 capsule (60 mg total) by mouth daily., Disp: 90 capsule, Rfl: 1 .  gabapentin (NEURONTIN) 100 MG capsule, Take 1-3 capsules (100-300 mg total) by mouth at bedtime for 30 days. Follow written titration schedule., Disp: 90 capsule, Rfl: 0 .  HUMIRA PEN 40 MG/0.8ML PNKT, Inject 40 mg into the skin 2 (two) times a week., Disp: , Rfl: 3 .  tiZANidine (ZANAFLEX) 4 MG tablet, TAKE 1 TABLET BY MOUTH  EVERY 6 HOURS AS NEEDED FOR MUSCLE SPASM(S), Disp: 120 tablet, Rfl: 0 .  traMADol (ULTRAM) 50 MG tablet, Take 1 tablet (50 mg total) by mouth every 12 (twelve) hours as needed for severe pain., Disp: 60 tablet, Rfl: 2 .  estradiol (ESTRACE) 0.1 MG/GM vaginal cream, Place 1 Applicatorful vaginally at bedtime. Insert 1g nightly for 1 wk, then 1 g once weekly as maintenace (Patient not taking: Reported on 08/27/2018), Disp: 42.5 g, Rfl: 1 .  eszopiclone (LUNESTA) 2 MG TABS tablet, Take 1 tablet (2 mg total) by mouth at bedtime as needed for  sleep. Take immediately before bedtime, Disp: 30 tablet, Rfl: 0 .  HYDROcodone-acetaminophen (NORCO) 10-325 MG tablet, Take 1 tablet by mouth every 8 (eight) hours as needed., Disp: 12 tablet, Rfl: 0 .  meloxicam (MOBIC) 7.5 MG tablet, Take 7.5 mg by mouth daily., Disp: , Rfl:   Allergies  Allergen Reactions  . Codeine Itching  . Methotrexate Nausea Only    I personally reviewed active problem list, medication list, allergies, family history, social history with the patient/caregiver today.   ROS  Constitutional: Negative for fever or weight change.  Respiratory: Negative for cough and shortness of breath.   Cardiovascular: Negative for chest pain or palpitations.  Gastrointestinal: Negative for abdominal pain, no bowel changes.  Musculoskeletal: Negative for gait problem , positive for  joint swelling.  Skin: Negative for rash.  Neurological: Negative for dizziness or headache.  No other specific complaints in a complete review of systems (except as listed in HPI above).  Objective  Vitals:   08/27/18 1540 08/27/18 1645  BP: (!) 164/100 (!) 172/100  Pulse: 85   Resp: 16   Temp: 98.2 F (36.8 C)   TempSrc: Oral   SpO2: 99%   Weight: 131 lb 14.4 oz (59.8 kg)   Height: 5\' 4"  (1.626 m)     Body mass index is 22.64 kg/m.  Physical Exam  Constitutional: Patient appears well-developed and well-nourished.  No distress.  HEENT: head atraumatic, normocephalic, pupils equal and reactive to light, neck supple, throat within normal limits Muscular Skeletal: pain and swelling over 3rd MCP worse on palm of the hand. Finger tips slightly swollen left more than right and slightly purple on both hands. She has raynaud's, good radial and ulnar pulses Cardiovascular: Normal rate, regular rhythm and normal heart sounds.  No murmur heard. No BLE edema. Pulmonary/Chest: Effort normal and breath sounds normal. No respiratory distress. Abdominal: Soft.  There is no tenderness. Psychiatric:  Patient has a normal mood and affect. behavior is normal. Judgment and thought content normal.  PHQ2/9: Depression screen [facility] 2/9 08/27/2018 07/20/2018 05/26/2018 02/19/2018 01/05/2018  Decreased Interest 3 0 3 3 0  Down, Depressed, Hopeless 3 0 3 3 0  PHQ - 2 Score 6 0 6 6 0  Altered sleeping 3 - 3 3 -  Tired, decreased energy 3 - 2 3 -  Change in appetite 1 - 0 1 -  Feeling bad or failure about yourself  0 - 0 1 -  Trouble concentrating 1 - 1 2 -  Moving slowly or fidgety/restless 1 - 0 1 -  Suicidal thoughts 1 - 0 1 -  PHQ-9 Score 16 - 12 18 -  Difficult doing work/chores Extremely dIfficult - Somewhat difficult Extremely dIfficult -  Some recent data might be hidden     Fall Risk: Fall Risk  08/27/2018 08/04/2018 07/20/2018 06/07/2018 05/26/2018  Falls in the past year? 0 0 0 0 0  Comment - - - - -  Number falls in past yr: 0 - - - -  Injury with Fall? 0 - - - -  Comment - - - - -  Risk for fall due to : - - - - -  Risk for fall due to: Comment - - - - -  Follow up - - - - -     Assessment & Plan  1. 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 Depression, major, single episode, moderate (HCC)  On medication, states symptoms worse because of pain, discussed therapy  4. Hand pain, left  - HYDROcodone-acetaminophen (NORCO) 10-325 MG tablet; Take 1 tablet by mouth every 8 (eight) hours as needed.  Dispense: 12 tablet; Refill: 0  5. Chronic low back pain without sciatica, unspecified back pain laterality  Sees Dr. Laban EmperorNaveira   6. Menopausal symptom  Doing well   7. Elevated blood pressure reading  She states goes up with pain , advised her to take clonidine when she gets home   8. Insomnia, persistent  Ambien no longer covered by insurance - eszopiclone (LUNESTA) 2 MG TABS tablet; Take 1 tablet (2 mg total) by mouth at bedtime as needed for sleep. Take immediately before bedtime  Dispense: 30 tablet; Refill: 0

## 2018-09-15 ENCOUNTER — Other Ambulatory Visit: Payer: Self-pay | Admitting: Nurse Practitioner

## 2018-09-15 ENCOUNTER — Telehealth: Payer: Self-pay | Admitting: Nurse Practitioner

## 2018-09-15 NOTE — Telephone Encounter (Signed)
Pt called and stated that she was trying to fill a prescription for tramadol and it said to refill before 07/06/18 so the pharmacy needs approval before they can fill it for her.

## 2018-09-15 NOTE — Telephone Encounter (Signed)
Thank you.Pharmacy called and informed.

## 2018-09-15 NOTE — Telephone Encounter (Signed)
Thank you for advising me of the outside RX. It was for a flare up of RA hand pain. She has a refill. They can refill Thanks

## 2018-09-15 NOTE — Telephone Encounter (Signed)
Crystal,            Please let me know if it is ok for patient to fill her Tramadol. Please review her recent prescriptions given per another provider. Thank you- Victorino Dike

## 2018-10-11 ENCOUNTER — Other Ambulatory Visit: Payer: Self-pay | Admitting: Family Medicine

## 2018-10-11 DIAGNOSIS — G4700 Insomnia, unspecified: Secondary | ICD-10-CM

## 2018-10-11 MED ORDER — ESZOPICLONE 2 MG PO TABS: 2.0000 mg | ORAL_TABLET | Freq: Every evening | ORAL | 0 refills | Status: DC | PRN

## 2018-10-11 NOTE — Telephone Encounter (Signed)
Refill request for general medication. Lunesta to [facility].  Last office visit 08/27/2018   Follow up on 11/26/2018

## 2018-10-13 ENCOUNTER — Other Ambulatory Visit: Payer: Self-pay | Admitting: Family Medicine

## 2018-10-13 DIAGNOSIS — N951 Menopausal and female climacteric states: Secondary | ICD-10-CM

## 2018-10-14 NOTE — Telephone Encounter (Signed)
Refill request for general medication. Clonidine to [facility].   Last office visit 08/27/2018   Follow up on 11/26/2018

## 2018-11-09 ENCOUNTER — Other Ambulatory Visit: Payer: Self-pay | Admitting: Family Medicine

## 2018-11-09 DIAGNOSIS — G4700 Insomnia, unspecified: Secondary | ICD-10-CM

## 2018-11-09 DIAGNOSIS — M5136 Other intervertebral disc degeneration, lumbar region: Secondary | ICD-10-CM

## 2018-11-09 DIAGNOSIS — F419 Anxiety disorder, unspecified: Secondary | ICD-10-CM

## 2018-11-09 DIAGNOSIS — M5126 Other intervertebral disc displacement, lumbar region: Secondary | ICD-10-CM

## 2018-11-09 NOTE — Telephone Encounter (Signed)
Refill request for general medication. Tizanidine to [facility].   Last office visit 08/27/2018   Follow up on 11/26/2018

## 2018-11-26 ENCOUNTER — Encounter: Payer: Self-pay | Admitting: Family Medicine

## 2018-11-26 ENCOUNTER — Ambulatory Visit (INDEPENDENT_AMBULATORY_CARE_PROVIDER_SITE_OTHER): Payer: Managed Care, Other (non HMO) | Admitting: Family Medicine

## 2018-11-26 ENCOUNTER — Other Ambulatory Visit: Payer: Self-pay

## 2018-11-26 VITALS — BP 132/82 | HR 107 | Temp 98.8°F | Resp 14 | Ht 64.0 in | Wt 134.4 lb

## 2018-11-26 DIAGNOSIS — F331 Major depressive disorder, recurrent, moderate: Secondary | ICD-10-CM

## 2018-11-26 DIAGNOSIS — E2839 Other primary ovarian failure: Secondary | ICD-10-CM | POA: Diagnosis not present

## 2018-11-26 DIAGNOSIS — Z13820 Encounter for screening for osteoporosis: Secondary | ICD-10-CM | POA: Diagnosis not present

## 2018-11-26 DIAGNOSIS — N951 Menopausal and female climacteric states: Secondary | ICD-10-CM | POA: Diagnosis not present

## 2018-11-26 DIAGNOSIS — F419 Anxiety disorder, unspecified: Secondary | ICD-10-CM

## 2018-11-26 DIAGNOSIS — M2550 Pain in unspecified joint: Secondary | ICD-10-CM

## 2018-11-26 DIAGNOSIS — E559 Vitamin D deficiency, unspecified: Secondary | ICD-10-CM

## 2018-11-26 DIAGNOSIS — K743 Primary biliary cirrhosis: Secondary | ICD-10-CM

## 2018-11-26 DIAGNOSIS — Z01419 Encounter for gynecological examination (general) (routine) without abnormal findings: Secondary | ICD-10-CM

## 2018-11-26 DIAGNOSIS — M0600 Rheumatoid arthritis without rheumatoid factor, unspecified site: Secondary | ICD-10-CM

## 2018-11-26 DIAGNOSIS — G8929 Other chronic pain: Secondary | ICD-10-CM

## 2018-11-26 DIAGNOSIS — M358 Other specified systemic involvement of connective tissue: Secondary | ICD-10-CM

## 2018-11-26 DIAGNOSIS — L940 Localized scleroderma [morphea]: Secondary | ICD-10-CM

## 2018-11-26 DIAGNOSIS — G4700 Insomnia, unspecified: Secondary | ICD-10-CM

## 2018-11-26 DIAGNOSIS — Z13220 Encounter for screening for lipoid disorders: Secondary | ICD-10-CM

## 2018-11-26 DIAGNOSIS — E538 Deficiency of other specified B group vitamins: Secondary | ICD-10-CM

## 2018-11-26 DIAGNOSIS — Z131 Encounter for screening for diabetes mellitus: Secondary | ICD-10-CM

## 2018-11-26 MED ORDER — CLONIDINE HCL 0.1 MG PO TABS: 0.1000 mg | ORAL_TABLET | Freq: Two times a day (BID) | ORAL | 0 refills | Status: DC

## 2018-11-26 MED ORDER — DULOXETINE HCL 60 MG PO CPEP: 60.0000 mg | ORAL_CAPSULE | Freq: Every day | ORAL | 1 refills | Status: DC

## 2018-11-26 MED ORDER — ALPRAZOLAM 0.5 MG PO TABS: 0.5000 mg | ORAL_TABLET | Freq: Every evening | ORAL | 0 refills | Status: DC | PRN

## 2018-11-26 MED ORDER — ESZOPICLONE 2 MG PO TABS: 2.0000 mg | ORAL_TABLET | Freq: Every evening | ORAL | 0 refills | Status: DC | PRN

## 2018-11-26 NOTE — Progress Notes (Signed)
Name: Candice Guzman   MRN: [id number]    DOB: May 28, 1961   Date:11/26/2018       Progress Note  Subjective  Chief Complaint  Chief Complaint  Patient presents with  . Annual Exam    HPI   Patient presents for annual CPE and follow up   SeronegativeRheumatoidArthritis: She has been off Simponi forover one [employer] stopped paying for it.She is back on Humiraand since [id number] off methotrexate because of hair loss. She is still under the care of Dr. Jefm Bryant. She is having a flare on left shoulder, swelling at times, advised to contact Dr. Jefm Bryant   Insomnia: able to sleep with Ambien CR , able to fall and stay asleep with medication.Medication is not covered , she is now on Lunesta 2 mg and is doing better, able to fall asleep better but still wakes up during the night because of pain, but able to fall back asleep.   Major Depression:she tried Effexor and Duloxetine, but it did not seem to improve depression symptoms. She states mood is affected by constant pain and also the hot flashes and the fact that she feels like she smells. She denies suicidal thoughts or ideation.Always tired, we tried switching to Sertraline on her last visit but she was unable to tolerate medication and it caused her to feel angry, she called back and resumed Duloxetine , we tried Seroquel at night but caused her to not rest and had weird dreams. Sleeping with lunesta, Phq 9 has improved, likely from working from home secondary to COVID-19. She states it takes her a lot of energy to shower in am's because of RA and by the time she gets to work pain is very intense and makes her tired. She has also been able to take a nap at lunch when really tired and ice her hands or take short breaks when pain is intense. Explained that we can assist in getting her approved to work from home if needed , after COVID-19  Menopause: she is now on Clonidine BID  she is also using a topical drysol on axillar  - prescribed by Dermatologist.  Back pain: she states she has a constant lower back tightness ,she saw Dr. Cleda Mccreedy for back injections for DDD but insurance denied any more coverage. Still takes Tramadol prn, off gabapentin because it cause worsening of hot flashes   Abnormal sense of taste and smell: saw by ENT and advised to have MRI, she states she is worried about cost. The sense has improved significantly but still has lack of taste, feeling better about it .Unchanged    Diet: she likes sugar and carbohydrates, explained that low carb diet may be healthier and decrease inflammation  Exercise: she is active taking care of her animals, but unable to any extra activity secondary to pain from RA  USPSTF grade A and B recommendations    Office Visit from 11/26/2018 in [facility]  AUDIT-C Score  0     Depression: Phq 9 is  positive Depression screen [facility] 2/9 11/26/2018 08/27/2018 07/20/2018 05/26/2018 02/19/2018  Decreased Interest 1 3 0 3 3  Down, Depressed, Hopeless 2 3 0 3 3  PHQ - 2 Score 3 6 0 6 6  Altered sleeping 2 3 - 3 3  Tired, decreased energy 1 3 - 2 3  Change in appetite 1 1 - 0 1  Feeling bad or failure about yourself  0 0 - 0 1  Trouble concentrating 0 1 -  1 2  Moving slowly or fidgety/restless 0 1 - 0 1  Suicidal thoughts 0 1 - 0 1  PHQ-9 Score 7 16 - 12 18  Difficult doing work/chores Somewhat difficult Extremely dIfficult - Somewhat difficult Extremely dIfficult  Some recent data might be hidden   Hypertension: BP Readings from Last 3 Encounters:  11/26/18 132/82  08/27/18 (!) 172/100  08/04/18 138/74   Obesity: Wt Readings from Last 3 Encounters:  11/26/18 134 lb 6.4 oz (61 kg)  08/27/18 131 lb 14.4 oz (59.8 kg)  08/04/18 130 lb (59 kg)   BMI Readings from Last 3 Encounters:  11/26/18 23.07 kg/m  08/27/18 22.64 kg/m  08/04/18 22.31 kg/m    Hep C Screening: up to date  STD testing and prevention (HIV/chl/gon/syphilis):  N/A Intimate partner violence: negative  Sexual History/Pain during Intercourse: not sexually active since pap smear  Menstrual History/LMP/Abnormal Bleeding: discussed importance of follow up if any post-menopausal bleeding  Incontinence Symptoms: none   Advanced Care Planning: A voluntary discussion about advance care planning including the explanation and discussion of advance directives.  Discussed health care proxy and Living will, and the patient was able to identify a health care proxy as her son - Denyse Amass .  Patient does not have a living will at present time.   Breast cancer: 2017 BRCA gene screening: N/A Cervical cancer screening:  Up to date   Osteoporosis Screening:  No results found for: HMDEXASCAN  Lipids:  Lab Results  Component Value Date   CHOL 197 02/11/2016   Lab Results  Component Value Date   HDL 78 (A) 02/11/2016   Lab Results  Component Value Date   LDLCALC 108 02/11/2016   Lab Results  Component Value Date   TRIG 57 02/11/2016   No results found for: CHOLHDL No results found for: LDLDIRECT  Glucose:  Glucose  Date Value Ref Range Status  07/08/2017 74 65 - 99 mg/dL Final    Skin cancer: discussed atypical lesions  Colorectal cancer: is up to date Lung cancer:  Low Dose CT Chest recommended if Age 58-80 years, 30 pack-year currently smoking OR have quit w/in 15years. Patient does not qualify.   ECG:2005  Patient Active Problem List   Diagnosis Date Noted  . Moderate episode of recurrent major depressive disorder ([place]) 08/27/2018  . Foot drop (Right) 08/04/2018  . Abnormal MRI, lumbar spine (06/21/2018) 08/04/2018  . Chronic lumbar radicular pain (L5 dermatome) (Bilateral) (R>L) 05/26/2018  . Long term prescription benzodiazepine use 09/21/2017  . Chronic lower extremity pain (Primary Area of Pain) (Bilateral) (R>L) 09/21/2017  . Chronic low back pain (Secondary Area of Pain) (Bilateral) (R>L) 09/21/2017  . Pharmacologic therapy 09/21/2017  .  Problems influencing health status 09/21/2017  . Spondylosis without myelopathy or radiculopathy, lumbosacral region 09/21/2017  . Chronic hip pain [facility] Area of Pain) (Bilateral) (R>L) 09/21/2017  . Chronic shoulder pain (Bilateral) (L>R) 09/21/2017  . Grade 1 Anterolisthesis of L5 over S1 & L3 over L4 09/21/2017  . Grade 1 Retrolisthesis L1 over L2 & L2 over L3 09/21/2017  . DDD (degenerative disc disease), lumbar 09/21/2017  . 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 Lumbar facet joint syndrome (Bilateral) (R>L) 09/21/2017  . Chronic musculoskeletal pain 09/21/2017  . Neurogenic pain 09/21/2017  . Lumbar foraminal stenosis (L5-S1) (Bilateral) 09/21/2017  . Lumbar lateral recess stenosis (Bilateral) (B: L4-5) (L>R: L3-4) 09/21/2017  . Lumbar facet hypertrophy (Bilateral: L3-4, L4-5, and L5-S1) 09/21/2017  . Chronic pain  syndrome 07/08/2017  . Chronic knee pain (Fourth Area of Pain) (Bilateral) 07/08/2017  . Chronic wrist pain (Fifth Area of Pain) (Bilateral) 07/08/2017  . Disorder of skeletal system 07/08/2017  . Other long term (current) drug therapy 07/08/2017  . Long term current use of opiate analgesic 07/08/2017  . Other specified health status 07/08/2017  . Chronic pain of multiple joints 05/20/2017  . Insomnia, persistent 12/18/2014  . Depression, major, single episode, moderate ([place]) 12/18/2014  . Menopausal symptom 12/18/2014  . Vitamin D deficiency 12/18/2014  . 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 Lumbosacral radiculopathy (L5) (Right) 12/12/2013  . Bulge of lumbar disc without myelopathy 06/18/2011    Past Surgical History:  Procedure Laterality Date  . BREAST BIOPSY Left 2010   neg  . CESAREAN SECTION    . FINGER ARTHROPLASTY Left 07/24/2017   Thumb  . GANGLION CYST EXCISION    . METACARPOPHALANGEAL JOINT ARTHROPLASTY Right 06/06/2016   Dr. Astrid Divine     Family History  Problem Relation Age of Onset  . Emphysema  Mother   . Cancer Father 54       lung  . Cancer Brother        tongue    Social History   Socioeconomic History  . Marital status: Divorced    Spouse name: Not on file  . Number of children: 1  . Years of education: Not on file  . Highest education level: High school graduate  Occupational History  . Not on file  Social Needs  . Financial resource strain: Somewhat hard  . Food insecurity:    Worry: Never true    Inability: Never true  . Transportation needs:    Medical: No    Non-medical: No  Tobacco Use  . Smoking status: Never Smoker  . Smokeless tobacco: Never Used  Substance and Sexual Activity  . Alcohol use: No    Alcohol/week: 0.0 standard drinks  . Drug use: No  . Sexual activity: Yes    Partners: Male  Lifestyle  . Physical activity:    Days per week: 0 days    Minutes per session: 0 min  . Stress: Very much  Relationships  . Social connections:    Talks on phone: More than three times a week    Gets together: Never    Attends religious service: Never    Active member of club or organization: No    Attends meetings of clubs or organizations: Never    Relationship status: Divorced  . Intimate partner violence:    Fear of current or ex partner: No    Emotionally abused: No    Physically abused: No    Forced sexual activity: No  Other Topics Concern  . Not on file  Social History Narrative  . Not on file     Current Outpatient Medications:  .  ALPRAZolam (XANAX) 0.5 MG tablet, TAKE 1 TABLET BY MOUTH EVERY NIGHT AT BEDTIME AS NEEDED, Disp: 30 tablet, Rfl: 0 .  aluminum chloride (DRYSOL) 20 % external solution, Drysol Dab-O-Matic 20 % topical solution, Disp: , Rfl:  .  cloNIDine (CATAPRES) 0.1 MG tablet, TAKE 1 TABLET BY MOUTH TWO  TIMES DAILY, Disp: 180 tablet, Rfl: 0 .  DULoxetine (CYMBALTA) 60 MG capsule, Take 1 capsule (60 mg total) by mouth daily., Disp: 90 capsule, Rfl: 1 .  eszopiclone (LUNESTA) 2 MG TABS tablet, Take 1 tablet (2 mg total)  by mouth at bedtime as needed for sleep. Take immediately before bedtime,  Disp: 30 tablet, Rfl: 0 .  HUMIRA PEN 40 MG/0.8ML PNKT, Inject 40 mg into the skin 2 (two) times a week., Disp: , Rfl: 3 .  tiZANidine (ZANAFLEX) 4 MG tablet, TAKE 1 TABLET BY MOUTH  EVERY 6 HOURS AS NEEDED FOR MUSCLE SPASM(S), Disp: 120 tablet, Rfl: 0 .  traMADol (ULTRAM) 50 MG tablet, Take 1 tablet (50 mg total) by mouth every 12 (twelve) hours as needed for severe pain., Disp: 60 tablet, Rfl: 2 .  estradiol (ESTRACE) 0.1 MG/GM vaginal cream, Place 1 Applicatorful vaginally at bedtime. Insert 1g nightly for 1 wk, then 1 g once weekly as maintenace (Patient not taking: Reported on 08/27/2018), Disp: 42.5 g, Rfl: 1 .  gabapentin (NEURONTIN) 100 MG capsule, Take 1-3 capsules (100-300 mg total) by mouth at bedtime for 30 days. Follow written titration schedule. (Patient not taking: Reported on 11/26/2018), Disp: 90 capsule, Rfl: 0 .  HYDROcodone-acetaminophen (NORCO) 10-325 MG tablet, Take 1 tablet by mouth every 8 (eight) hours as needed. (Patient not taking: Reported on 11/26/2018), Disp: 12 tablet, Rfl: 0 .  meloxicam (MOBIC) 7.5 MG tablet, Take 7.5 mg by mouth daily., Disp: , Rfl:   Allergies  Allergen Reactions  . Codeine Itching  . Methotrexate Nausea Only     ROS  Constitutional: Negative for fever or weight change.  Respiratory: Negative for cough and shortness of breath.   Cardiovascular: Negative for chest pain or palpitations.  Gastrointestinal: Negative for abdominal pain, no bowel changes.  Musculoskeletal: Negative for gait problem , positive for  joint swelling.  Skin: Negative for rash.  Neurological: Negative for dizziness or headache.  No other specific complaints in a complete review of systems (except as listed in HPI above).   Objective  Vitals:   11/26/18 1330  BP: 132/82  Pulse: (!) 107  Resp: 14  Temp: 98.8 F (37.1 C)  TempSrc: Oral  SpO2: 98%  Weight: 134 lb 6.4 oz (61 kg)  Height:  5' 4"  (1.626 m)    Body mass index is 23.07 kg/m.  Physical Exam  Constitutional: Patient appears well-developed and well-nourished. No distress.  HENT: Head: Normocephalic and atraumatic. Ears: B TMs ok, no erythema or effusion; Nose: Nose normal. Mouth/Throat: Oropharynx is clear and moist. No oropharyngeal exudate.  Eyes: Conjunctivae and EOM are normal. Pupils are equal, round, and reactive to light. No scleral icterus.  Neck: Normal range of motion. Neck supple. No JVD present. No thyromegaly present.  Cardiovascular: Normal rate, regular rhythm and normal heart sounds.  No murmur heard. No BLE edema. Pulmonary/Chest: Effort normal and breath sounds normal. No respiratory distress. Abdominal: Soft. Bowel sounds are normal, no distension. There is no tenderness. no masses Breast: no lumps or masses, no nipple discharge or rashes FEMALE GENITALIA:  External genitalia normal External urethra normal Vaginal vault normal without discharge or lesions Cervix normal without discharge or lesions Bimanual exam normal without masses RECTAL: not done  Musculoskeletal: decrease rom of left shoulder, some deformity of left thumb, no effusion on hands at this time, but seems to have some on left shoulder,with some increase in warmth but no redness  Neurological: he is alert and oriented to person, place, and time. No cranial nerve deficit. Coordination, balance, strength, speech and gait are normal.  Skin: Skin is warm and dry. No rash noted. No erythema.  Psychiatric: Patient has a normal mood and affect. behavior is normal. Judgment and thought content normal.  PHQ2/9: Depression screen [facility] 2/9 11/26/2018 08/27/2018 07/20/2018 05/26/2018 02/19/2018  Decreased Interest 1 3 0 3 3  Down, Depressed, Hopeless 2 3 0 3 3  PHQ - 2 Score 3 6 0 6 6  Altered sleeping 2 3 - 3 3  Tired, decreased energy 1 3 - 2 3  Change in appetite 1 1 - 0 1  Feeling bad or failure about yourself  0 0 - 0 1  Trouble  concentrating 0 1 - 1 2  Moving slowly or fidgety/restless 0 1 - 0 1  Suicidal thoughts 0 1 - 0 1  PHQ-9 Score 7 16 - 12 18  Difficult doing work/chores Somewhat difficult Extremely dIfficult - Somewhat difficult Extremely dIfficult  Some recent data might be hidden     Fall Risk: Fall Risk  11/26/2018 08/27/2018 08/04/2018 07/20/2018 06/07/2018  Falls in the past year? 0 0 0 0 0  Comment - - - - -  Number falls in past yr: 0 0 - - -  Injury with Fall? 0 0 - - -  Comment - - - - -  Risk for fall due to : - - - - -  Risk for fall due to: Comment - - - - -  Follow up - - - - -    Functional Status Survey: Is the patient deaf or have difficulty hearing?: No Does the patient have difficulty seeing, even when wearing glasses/contacts?: No Does the patient have difficulty concentrating, remembering, or making decisions?: No Does the patient have difficulty walking or climbing stairs?: No Does the patient have difficulty dressing or bathing?: No Does the patient have difficulty doing errands alone such as visiting a doctor's office or shopping?: No   Assessment & Plan  1. Well woman exam  - DG Bone Density; Future - Lipid panel - B12 and Folate Panel - COMPLETE METABOLIC PANEL WITH GFR - CBC with Differential/Platelet - VITAMIN D 25 Hydroxy (Vit-D Deficiency, Fractures) - Hemoglobin A1c - TSH  2. Osteoporosis screening  - DG Bone Density; Future  3. Ovarian failure  - DG Bone Density; Future  4. Menopausal symptom  - cloNIDine (CATAPRES) 0.1 MG tablet; Take 1 tablet (0.1 mg total) by mouth 2 (two) times daily.  Dispense: 180 tablet; Refill: 0  5. 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 Chronic pain of multiple joints   8. Depression, major, recurrent, moderate ([facility])  - COMPLETE METABOLIC PANEL WITH GFR - CBC with Differential/Platelet - TSH  9. Diabetes mellitus screening  - Hemoglobin  A1c  10. Vitamin D deficiency  - VITAMIN D 25 Hydroxy (Vit-D Deficiency, Fractures)  11. Vitamin B12 deficiency  - B12 and Folate Panel  12. Lipid screening  - Lipid panel  13. Anxiety  - ALPRAZolam (XANAX) 0.5 MG tablet; Take 1 tablet (0.5 mg total) by mouth at bedtime as needed.  Dispense: 30 tablet; Refill: 0  14. Insomnia, persistent  - eszopiclone (LUNESTA) 2 MG TABS tablet; Take 1 tablet (2 mg total) by mouth at bedtime as needed for sleep. Take immediately before bedtime  Dispense: 90 tablet; Refill: 0  -USPSTF grade A and B recommendations reviewed with patient; age-appropriate recommendations, preventive care, screening tests, etc discussed and encouraged; healthy living encouraged; see AVS for patient education given to patient -Discussed importance of 150 minutes of physical activity weekly, eat two servings of fish weekly, eat one serving of tree nuts ( cashews, pistachios, pecans, almonds.Marland Kitchen) every other day, eat 6 servings of  fruit/vegetables daily and drink plenty of water and avoid sweet beverages.

## 2018-11-26 NOTE — Patient Instructions (Signed)
Preventive Care 40-64 Years, Female Preventive care refers to lifestyle choices and visits with your health care provider that can promote health and wellness. What does preventive care include?   A yearly physical exam. This is also called an annual well check.  Dental exams once or twice a year.  Routine eye exams. Ask your health care provider how often you should have your eyes checked.  Personal lifestyle choices, including: ? Daily care of your teeth and gums. ? Regular physical activity. ? Eating a healthy diet. ? Avoiding tobacco and drug use. ? Limiting alcohol use. ? Practicing safe sex. ? Taking low-dose aspirin daily starting at age 50. ? Taking vitamin and mineral supplements as recommended by your health care provider. What happens during an annual well check? The services and screenings done by your health care provider during your annual well check will depend on your age, overall health, lifestyle risk factors, and family history of disease. Counseling Your health care provider may ask you questions about your:  Alcohol use.  Tobacco use.  Drug use.  Emotional well-being.  Home and relationship well-being.  Sexual activity.  Eating habits.  Work and work environment.  Method of birth control.  Menstrual cycle.  Pregnancy history. Screening You may have the following tests or measurements:  Height, weight, and BMI.  Blood pressure.  Lipid and cholesterol levels. These may be checked every 5 years, or more frequently if you are over 50 years old.  Skin check.  Lung cancer screening. You may have this screening every year starting at age 55 if you have a 30-pack-year history of smoking and currently smoke or have quit within the past 15 years.  Colorectal cancer screening. All adults should have this screening starting at age 50 and continuing until age 75. Your health care provider may recommend screening at age 45. You will have tests every  1-10 years, depending on your results and the type of screening test. People at increased risk should start screening at an earlier age. Screening tests may include: ? Guaiac-based fecal occult blood testing. ? Fecal immunochemical test (FIT). ? Stool DNA test. ? Virtual colonoscopy. ? Sigmoidoscopy. During this test, a flexible tube with a tiny camera (sigmoidoscope) is used to examine your rectum and lower colon. The sigmoidoscope is inserted through your anus into your rectum and lower colon. ? Colonoscopy. During this test, a long, thin, flexible tube with a tiny camera (colonoscope) is used to examine your entire colon and rectum.  Hepatitis C blood test.  Hepatitis B blood test.  Sexually transmitted disease (STD) testing.  Diabetes screening. This is done by checking your blood sugar (glucose) after you have not eaten for a while (fasting). You may have this done every 1-3 years.  Mammogram. This may be done every 1-2 years. Talk to your health care provider about when you should start having regular mammograms. This may depend on whether you have a family history of breast cancer.  BRCA-related cancer screening. This may be done if you have a family history of breast, ovarian, tubal, or peritoneal cancers.  Pelvic exam and Pap test. This may be done every 3 years starting at age 21. Starting at age 30, this may be done every 5 years if you have a Pap test in combination with an HPV test.  Bone density scan. This is done to screen for osteoporosis. You may have this scan if you are at high risk for osteoporosis. Discuss your test results, treatment options,   and if necessary, the need for more tests with your health care provider. Vaccines Your health care provider may recommend certain vaccines, such as:  Influenza vaccine. This is recommended every year.  Tetanus, diphtheria, and acellular pertussis (Tdap, Td) vaccine. You may need a Td booster every 10 years.  Varicella  vaccine. You may need this if you have not been vaccinated.  Zoster vaccine. You may need this after age 38.  Measles, mumps, and rubella (MMR) vaccine. You may need at least one dose of MMR if you were born in 1957 or later. You may also need a second dose.  Pneumococcal 13-valent conjugate (PCV13) vaccine. You may need this if you have certain conditions and were not previously vaccinated.  Pneumococcal polysaccharide (PPSV23) vaccine. You may need one or two doses if you smoke cigarettes or if you have certain conditions.  Meningococcal vaccine. You may need this if you have certain conditions.  Hepatitis A vaccine. You may need this if you have certain conditions or if you travel or work in places where you may be exposed to hepatitis A.  Hepatitis B vaccine. You may need this if you have certain conditions or if you travel or work in places where you may be exposed to hepatitis B.  Haemophilus influenzae type b (Hib) vaccine. You may need this if you have certain conditions. Talk to your health care provider about which screenings and vaccines you need and how often you need them. This information is not intended to replace advice given to you by your health care provider. Make sure you discuss any questions you have with your health care provider. Document Released: 07/20/2015 Document Revised: 08/13/2017 Document Reviewed: 04/24/2015 Elsevier Interactive Patient Education  2019 [employer].

## 2018-11-27 LAB — CMP14+EGFR
ALT: 26 IU/L (ref 0–32)
AST: 27 IU/L (ref 0–40)
Albumin/Globulin Ratio: 2 (ref 1.2–2.2)
Albumin: 4.2 g/dL (ref 3.8–4.9)
Alkaline Phosphatase: 131 IU/L — ABNORMAL HIGH (ref 39–117)
BUN/Creatinine Ratio: 11 (ref 9–23)
BUN: 10 mg/dL (ref 6–24)
Bilirubin Total: 0.2 mg/dL (ref 0.0–1.2)
CO2: 25 mmol/L (ref 20–29)
Calcium: 9.3 mg/dL (ref 8.7–10.2)
Chloride: 102 mmol/L (ref 96–106)
Creatinine, Ser: 0.87 mg/dL (ref 0.57–1.00)
GFR calc Af Amer: 86 mL/min/{1.73_m2} (ref >59)
GFR calc non Af Amer: 74 mL/min/{1.73_m2} (ref >59)
Globulin, Total: 2.1 g/dL (ref 1.5–4.5)
Glucose: 83 mg/dL (ref 65–99)
Potassium: 3.9 mmol/L (ref 3.5–5.2)
Sodium: 140 mmol/L (ref 134–144)
Total Protein: 6.3 g/dL (ref 6.0–8.5)

## 2018-11-27 LAB — CBC WITH DIFFERENTIAL/PLATELET
Basophils Absolute: 0 10*3/uL (ref 0.0–0.2)
Basos: 1 %
EOS (ABSOLUTE): 0.3 10*3/uL (ref 0.0–0.4)
Eos: 4 %
Hematocrit: 38.6 % (ref 34.0–46.6)
Hemoglobin: 13.3 g/dL (ref 11.1–15.9)
Immature Grans (Abs): 0 10*3/uL (ref 0.0–0.1)
Immature Granulocytes: 0 %
Lymphocytes Absolute: 2.7 10*3/uL (ref 0.7–3.1)
Lymphs: 37 %
MCH: 30.3 pg (ref 26.6–33.0)
MCHC: 34.5 g/dL (ref 31.5–35.7)
MCV: 88 fL (ref 79–97)
Monocytes Absolute: 0.5 10*3/uL (ref 0.1–0.9)
Monocytes: 8 %
Neutrophils Absolute: 3.6 10*3/uL (ref 1.4–7.0)
Neutrophils: 50 %
Platelets: 313 10*3/uL (ref 150–450)
RBC: 4.39 x10E6/uL (ref 3.77–5.28)
RDW: 12.6 % (ref 11.7–15.4)
WBC: 7.2 10*3/uL (ref 3.4–10.8)

## 2018-11-27 LAB — HEMOGLOBIN A1C
Est. average glucose Bld gHb Est-mCnc: 100 mg/dL
Hgb A1c MFr Bld: 5.1 % (ref 4.8–5.6)

## 2018-11-27 LAB — LIPID PANEL
Chol/HDL Ratio: 4.8 ratio — ABNORMAL HIGH (ref 0.0–4.4)
Cholesterol, Total: 257 mg/dL — ABNORMAL HIGH (ref 100–199)
HDL: 54 mg/dL (ref >39)
LDL Calculated: 181 mg/dL — ABNORMAL HIGH (ref 0–99)
Triglycerides: 110 mg/dL (ref 0–149)
VLDL Cholesterol Cal: 22 mg/dL (ref 5–40)

## 2018-11-27 LAB — B12 AND FOLATE PANEL
Folate: 11.4 ng/mL
Vitamin B-12: 356 pg/mL (ref 232–1245)

## 2018-12-07 ENCOUNTER — Encounter: Payer: Self-pay | Admitting: Pain Medicine

## 2018-12-07 ENCOUNTER — Encounter: Payer: Managed Care, Other (non HMO) | Admitting: Nurse Practitioner

## 2018-12-07 ENCOUNTER — Other Ambulatory Visit: Payer: Self-pay

## 2018-12-07 ENCOUNTER — Ambulatory Visit: Payer: Managed Care, Other (non HMO) | Attending: Nurse Practitioner | Admitting: Pain Medicine

## 2018-12-07 ENCOUNTER — Other Ambulatory Visit: Payer: Self-pay | Admitting: Pain Medicine

## 2018-12-07 DIAGNOSIS — M5442 Lumbago with sciatica, left side: Secondary | ICD-10-CM | POA: Diagnosis not present

## 2018-12-07 DIAGNOSIS — M792 Neuralgia and neuritis, unspecified: Secondary | ICD-10-CM

## 2018-12-07 DIAGNOSIS — M159 Polyosteoarthritis, unspecified: Secondary | ICD-10-CM

## 2018-12-07 DIAGNOSIS — M25531 Pain in right wrist: Secondary | ICD-10-CM

## 2018-12-07 DIAGNOSIS — M25551 Pain in right hip: Secondary | ICD-10-CM | POA: Diagnosis not present

## 2018-12-07 DIAGNOSIS — M47816 Spondylosis without myelopathy or radiculopathy, lumbar region: Secondary | ICD-10-CM

## 2018-12-07 DIAGNOSIS — G894 Chronic pain syndrome: Secondary | ICD-10-CM

## 2018-12-07 DIAGNOSIS — M79605 Pain in left leg: Secondary | ICD-10-CM

## 2018-12-07 DIAGNOSIS — M150 Primary generalized (osteo)arthritis: Secondary | ICD-10-CM

## 2018-12-07 DIAGNOSIS — M25562 Pain in left knee: Secondary | ICD-10-CM

## 2018-12-07 DIAGNOSIS — M7918 Myalgia, other site: Secondary | ICD-10-CM

## 2018-12-07 DIAGNOSIS — M25532 Pain in left wrist: Secondary | ICD-10-CM

## 2018-12-07 DIAGNOSIS — M25552 Pain in left hip: Secondary | ICD-10-CM

## 2018-12-07 DIAGNOSIS — M79604 Pain in right leg: Secondary | ICD-10-CM

## 2018-12-07 DIAGNOSIS — M25561 Pain in right knee: Secondary | ICD-10-CM

## 2018-12-07 DIAGNOSIS — M5441 Lumbago with sciatica, right side: Secondary | ICD-10-CM

## 2018-12-07 DIAGNOSIS — G8929 Other chronic pain: Secondary | ICD-10-CM

## 2018-12-07 MED ORDER — MELOXICAM 15 MG PO TABS: 15.0000 mg | ORAL_TABLET | Freq: Every day | ORAL | 0 refills | Status: DC

## 2018-12-07 MED ORDER — PREGABALIN 25 MG PO CAPS: ORAL_CAPSULE | ORAL | 0 refills | Status: DC

## 2018-12-07 MED ORDER — TRAMADOL HCL 50 MG PO TABS: 50.0000 mg | ORAL_TABLET | Freq: Two times a day (BID) | ORAL | 0 refills | Status: DC | PRN

## 2018-12-07 MED ORDER — TIZANIDINE HCL 4 MG PO TABS: 4.0000 mg | ORAL_TABLET | Freq: Three times a day (TID) | ORAL | 0 refills | Status: DC | PRN

## 2018-12-07 NOTE — Patient Instructions (Addendum)
____________________________________________________________________________________________  Pain Prevention Technique  Definition:   A technique used to minimize the effects of an activity known to cause inflammation or swelling, which in turn leads to an increase in pain.  Purpose: To prevent swelling from occurring. It is based on the fact that it is easier to prevent swelling from happening than it is to get rid of it, once it occurs.  Contraindications: 1. Anyone with allergy or hypersensitivity to the recommended medications. 2. Anyone taking anticoagulants (Blood Thinners) (e.g., Coumadin, Warfarin, Plavix, etc.). 3. Patients in Renal Failure.  Technique: Before you undertake an activity known to cause pain, or a flare-up of your chronic pain, and before you experience any pain, do the following:  1. On a full stomach, take 4 (four) over the counter Ibuprofens 200mg  tablets (Motrin), for a total of 800 mg. 2. In addition, take over the counter Magnesium 400 to 500 mg, before doing the activity.  3. Six (6) hours later, again on a full stomach, repeat the Ibuprofen. 4. That night, take a warm shower and stretch under the running warm water.  This technique may be sufficient to abort the pain and discomfort before it happens. Keep in mind that it takes a lot less medication to prevent swelling than it takes to eliminate it once it occurs.  ____________________________________________________________________________________________    ____________________________________________________________________________________________  Medication Rules  Purpose: To inform patients, and their family members, of our rules and regulations.  Applies to: All patients receiving prescriptions (written or electronic).  Pharmacy of record: Pharmacy where electronic prescriptions will be sent. If written prescriptions are taken to a different pharmacy, please inform the nursing staff. The  pharmacy listed in the electronic medical record should be the one where you would like electronic prescriptions to be sent.  Electronic prescriptions: In compliance with the [place] (STOP) Act of 2017 (Session Lanny Cramp [phone]), effective July 07, 2018, all controlled substances must be electronically prescribed. Calling prescriptions to the pharmacy will cease to exist.  Prescription refills: Only during scheduled appointments. Applies to all prescriptions.  NOTE: The following applies primarily to controlled substances (Opioid* Pain Medications).   Patient's responsibilities: 1. Pain Pills: Bring all pain pills to every appointment (except for procedure appointments). 2. Pill Bottles: Bring pills in original pharmacy bottle. Always bring the newest bottle. Bring bottle, even if empty. 3. Medication refills: You are responsible for knowing and keeping track of what medications you take and those you need refilled. The day before your appointment: write a list of all prescriptions that need to be refilled. The day of the appointment: give the list to the admitting nurse. Prescriptions will be written only during appointments. No prescriptions will be written on procedure days. If you forget a medication: it will not be "Called in", "Faxed", or "electronically sent". You will need to get another appointment to get these prescribed. No early refills. Do not call asking to have your prescription filled early. 4. Prescription Accuracy: You are responsible for carefully inspecting your prescriptions before leaving our office. Have the discharge nurse carefully go over each prescription with you, before taking them home. Make sure that your name is accurately spelled, that your address is correct. Check the name and dose of your medication to make sure it is accurate. Check the number of pills, and the written instructions to make sure they are clear and  accurate. Make sure that you are given enough medication to last until your next medication refill appointment. 5.  Taking Medication: Take medication as prescribed. When it comes to controlled substances, taking less pills or less frequently than prescribed is permitted and encouraged. Never take more pills than instructed. Never take medication more frequently than prescribed.  6. Inform other Doctors: Always inform, all of your healthcare providers, of all the medications you take. 7. Pain Medication from other Providers: You are not allowed to accept any additional pain medication from any other Doctor or Healthcare provider. There are two exceptions to this rule. (see below) In the event that you require additional pain medication, you are responsible for notifying us, as stated below. 8. Medication Agreement: You are responsible for carefully reading and following our Medication Agreement. This must be signed before receiving any prescriptions from our practice. Safely store a copy of your signed Agreement. Violations to the Agreement will result in no further prescriptions. (Additional copies of our Medication Agreement are available upon request.) 9. Laws, Rules, & Regulations: All patients are expected to follow all Federal and [employer], [employer], Rules, [employer]. Ignorance of the Laws does not constitute a valid excuse. The use of any illegal substances is prohibited. 10. Adopted CDC guidelines & recommendations: Target dosing levels will be at or below 60 MME/day. Use of benzodiazepines** is not recommended.  Exceptions: There are only two exceptions to the rule of not receiving pain medications from other Healthcare Providers. 1. Exception #1 (Emergencies): In the event of an emergency (i.e.: accident requiring emergency care), you are allowed to receive additional pain medication. However, you are responsible for: As soon as you are able, call our office [phone], at any time of  the day or night, and leave a message stating your name, the date and nature of the emergency, and the name and dose of the medication prescribed. In the event that your call is answered by a member of our staff, make sure to document and save the date, time, and the name of the person that took your information.  2. Exception #2 (Planned Surgery): In the event that you are scheduled by another doctor or dentist to have any type of surgery or procedure, you are allowed (for a period no longer than 30 days), to receive additional pain medication, for the acute post-op pain. However, in this case, you are responsible for picking up a copy of our "Post-op Pain Management for Surgeons" handout, and giving it to your surgeon or dentist. This document is available at our office, and does not require an appointment to obtain it. Simply go to our office during business hours (Monday-Thursday from 8:00 AM to 4:00 PM) (Friday 8:00 AM to 12:00 Noon) or if you have a scheduled appointment with [place], prior to your surgery, and ask for it by name. In addition, you will need to provide [place] with your name, name of your surgeon, type of surgery, and date of procedure or surgery.  *Opioid medications include: morphine, codeine, oxycodone, oxymorphone, hydrocodone, hydromorphone, meperidine, tramadol, tapentadol, buprenorphine, fentanyl, methadone. **Benzodiazepine medications include: diazepam (Valium), alprazolam (Xanax), clonazepam (Klonopine), lorazepam (Ativan), clorazepate (Tranxene), chlordiazepoxide (Librium), estazolam (Prosom), oxazepam (Serax), temazepam (Restoril), triazolam (Halcion) (Last updated: 09/03/2017) ____________________________________________________________________________________________   ____________________________________________________________________________________________  Medication Recommendations and Reminders  Applies to: All patients receiving prescriptions (written and/or  electronic).  Medication Rules & Regulations: These rules and regulations exist for your safety and that of others. They are not flexible and neither are we. Dismissing or ignoring them will be considered "non-compliance" with medication therapy, resulting in complete and irreversible termination  of such therapy. (See document titled "Medication Rules" for more details.) In all conscience, because of safety reasons, we cannot continue providing a therapy where the patient does not follow instructions.  Pharmacy of record:   Definition: This is the pharmacy where your electronic prescriptions will be sent.   We do not endorse any particular pharmacy.  You are not restricted in your choice of pharmacy.  The pharmacy listed in the electronic medical record should be the one where you want electronic prescriptions to be sent.  If you choose to change pharmacy, simply notify our nursing staff of your choice of new pharmacy.  Recommendations:  Keep all of your pain medications in a safe place, under lock and key, even if you live alone.   After you fill your prescription, take 1 week's worth of pills and put them away in a safe place. You should keep a separate, properly labeled bottle for this purpose. The remainder should be kept in the original bottle. Use this as your primary supply, until it runs out. Once it's gone, then you know that you have 1 week's worth of medicine, and it is time to come in for a prescription refill. If you do this correctly, it is unlikely that you will ever run out of medicine.  To make sure that the above recommendation works, it is very important that you make sure your medication refill appointments are scheduled at least 1 week before you run out of medicine. To do this in an effective manner, make sure that you do not leave the office without scheduling your next medication management appointment. Always ask the nursing staff to show you in your prescription , when  your medication will be running out. Then arrange for the receptionist to get you a return appointment, at least 7 days before you run out of medicine. Do not wait until you have 1 or 2 pills left, to come in. This is very poor planning and does not take into consideration that we may need to cancel appointments due to bad weather, sickness, or emergencies affecting our staff.  "Partial Fill": If for any reason your pharmacy does not have enough pills/tablets to completely fill or refill your prescription, do not allow for a "partial fill". You will need a separate prescription to fill the remaining amount, which we will not provide. If the reason for the partial fill is your insurance, you will need to talk to the pharmacist about payment alternatives for the remaining tablets, but again, do not accept a partial fill.  Prescription refills and/or changes in medication(s):   Prescription refills, and/or changes in dose or medication, will be conducted only during scheduled medication management appointments. (Applies to both, written and electronic prescriptions.)  No refills on procedure days. No medication will be changed or started on procedure days. No changes, adjustments, and/or refills will be conducted on a procedure day. Doing so will interfere with the diagnostic portion of the procedure.  No phone refills. No medications will be "called into the pharmacy".  No Fax refills.  No weekend refills.  No Holliday refills.  No after hours refills.  Remember:  Business hours are:  Monday to Thursday 8:00 AM to 4:00 PM Provider's Schedule: Dionisio David, NP - Appointments are:  Medication management: Monday to Thursday 8:00 AM to 4:00 PM Milinda Pointer, MD - Appointments are:  Medication management: Monday and Wednesday 8:00 AM to 4:00 PM Procedure day: Tuesday and Thursday 7:30 AM to 4:00 PM Bilal  Cherylann Ratel, MD - Appointments are:  Medication management: Tuesday and Thursday 8:00 AM to  4:00 PM Procedure day: Monday and Wednesday 7:30 AM to 4:00 PM (Last update: 09/03/2017) ____________________________________________________________________________________________   ____________________________________________________________________________________________  CANNABIDIOL (AKA: CBD Oil or Pills)  Applies to: All patients receiving prescriptions of controlled substances (written and/or electronic).  General Information: Cannabidiol (CBD) was discovered in 62. It is one of some 113 identified cannabinoids in cannabis (Marijuana) plants, accounting for up to 40% of the plant's extract. As of 2018, preliminary clinical research on cannabidiol included studies of anxiety, cognition, movement disorders, and pain.  Cannabidiol is consummed in multiple ways, including inhalation of cannabis smoke or vapor, as an aerosol spray into the cheek, and by mouth. It may be supplied as CBD oil containing CBD as the active ingredient (no added tetrahydrocannabinol (THC) or terpenes), a full-plant CBD-dominant hemp extract oil, capsules, dried cannabis, or as a liquid solution. CBD is thought not have the same psychoactivity as THC, and may affect the actions of THC. Studies suggest that CBD may interact with different biological targets, including cannabinoid receptors and other neurotransmitter receptors. As of 2018 the mechanism of action for its biological effects has not been determined.  In the [place], cannabidiol has a limited approval by the Food and Drug Administration (FDA) for treatment of only two types of epilepsy disorders. The side effects of long-term use of the drug include somnolence, decreased appetite, diarrhea, fatigue, malaise, weakness, sleeping problems, and others.  CBD remains a Schedule I drug prohibited for any use.  Legality: Some manufacturers ship CBD products nationally, an illegal action which the FDA has not enforced in 2018, with CBD remaining the subject  of an FDA investigational new drug evaluation, and is not considered legal as a dietary supplement or food ingredient as of December 2018. Federal illegality has made it difficult historically to conduct research on CBD. CBD is openly sold in head shops and health food stores in some states where such sales have not been explicitly legalized.  Warning: Because it is not FDA approved for general use or treatment of pain, it is not required to undergo the same manufacturing controls as prescription drugs.  This means that the available cannabidiol (CBD) may be contaminated with THC.  If this is the case, it will trigger a positive urine drug screen (UDS) test for cannabinoids (Marijuana).  Because a positive UDS for illicit substances is a violation of our medication agreement, your opioid analgesics (pain medicine) may be permanently discontinued. (Last update: 09/24/2017) ____________________________________________________________________________________________

## 2018-12-07 NOTE — Progress Notes (Signed)
Pain Management Virtual Encounter Note - Virtual Visit via Telephone Telehealth (real-time audio visits between healthcare provider and patient).  Patient's Phone No. & Preferred Pharmacy:  [phone] (home); [phone] (mobile); (Preferred) [phone] [email or web address] .[id number] [place], [place] - [address] AT [facility] OF [address] [place] [address] Phone: [phone] Fax: [phone]  [facility] - [place], [place] - [address] [facility] [address] [place] [address] [place] [address] Phone: [phone] Fax: [phone]   Pre-screening note:  Our staff contacted Candice Guzman and offered her an "in person", "face-to-face" appointment versus a telephone encounter. She indicated preferring the telephone encounter, at this time.  Reason for Virtual Visit: COVID-19*  Social distancing based on CDC and AMA recommendations.   I contacted Candice Guzman on 12/07/2018 at 9:23 AM via telephone.      I clearly identified myself as [name], MD. I verified that I was speaking with the correct person using two identifiers (Name and date of birth: [birth year]).  Advanced Informed Consent I sought verbal advanced consent from Candice Guzman for virtual visit interactions. I informed Candice Guzman of possible security and privacy concerns, risks, and limitations associated with providing "not-in-person" medical evaluation and management services. I also informed Candice Guzman of the availability of "in-person" appointments. Finally, I informed her that there would be a charge for the virtual visit and that she could be  personally, fully or partially, financially responsible for it. Candice Guzman expressed understanding and agreed to proceed.   Historic Elements   Candice Guzman is a 58 y.o. year old, female patient evaluated today after her last encounter by our practice on 09/15/2018. Ms. Corts  has  a past medical history of Depression, Insomnia, Lumbar herniated disc, Menopause, Rheumatoid arthritis (HCC), and Vitamin D deficiency. She also  has a past surgical history that includes Ganglion cyst excision; Cesarean section; Breast biopsy (Left, 2010); Metacarpophalangeal joint arthroplasty (Right, 06/06/2016); and Finger arthroplasty (Left, 07/24/2017). Ms. Tkach has a current medication list which includes the following prescription(s): alprazolam, clonidine, duloxetine, eszopiclone, humira pen, prednisone, tizanidine, meloxicam, pregabalin, and tramadol. She  reports that she has never smoked. She has never used smokeless tobacco. She reports that she does not drink alcohol or use drugs. Candice Guzman is allergic to codeine and methotrexate.   HPI  Today, she is being contacted for medication management.  Today I had a long conversation with the patient regarding her medications.  We started by explained to the patient the concept of muscle pain versus joint pain versus nerve pain and how each 1 needs a different type of medication to address it.  We also talked about Preemptive Analgesia and I provided her with written information on this by way of her after visit summary.  Then we went on to talk about her Mobic, tramadol, and Zanaflex.  Today I have taken over her muscle relaxant and I have decided to start the patient on Lyrica for her neurogenic pain.  It turns out that the patient had try some Neurontin and Lyrica in the past and she indicates that the Lyrica seems to have worked better after her surgery and had few side effects.  She talked about the pain that she experiences in the morning, at the bottom of her feet and how this seems to be a burning, electrical-like type of sensation that has not been eased by any opioid medications.  And  explained to the patient the concept of Preemptive Analgesia, and make sure that she understood that whenever she plans to use it, she should not use the Mobic in  the morning.  Today I have also increased her Mobic to 15 mg/day from the 7.5 mg she had before.  I will see the patient back in 1 month to see how she is doing with these changes.  Pharmacotherapy Assessment  Analgesic: Tramadol 50 mg 1-2 tablet p.o. twice daily (100 mg/day of tramadol) MME/day: 10 mg/day.   Monitoring: Pharmacotherapy: No side-effects or adverse reactions reported. [place] PMP: PDMP reviewed during this encounter.       Compliance: No problems identified. Effectiveness: Clinically acceptable. Plan: Refer to "POC".  Pertinent Labs  Renal Function Lab Results  Component Value Date   BUN 10 11/26/2018   CREATININE 0.87 11/26/2018   BCR 11 11/26/2018   GFRAA 86 11/26/2018   GFRNONAA 74 11/26/2018   Hepatic Function Lab Results  Component Value Date   AST 27 11/26/2018   ALT 26 11/26/2018   ALBUMIN 4.2 11/26/2018   UDS Summary  Date Value Ref Range Status  01/05/2018 FINAL  Final    Comment:    ==================================================================== TOXASSURE SELECT 13 (MW) ==================================================================== Test                             Result       Flag       Units Drug Present and Declared for Prescription Verification   Tramadol                       2773         EXPECTED   ng/mg creat   O-Desmethyltramadol            6615         EXPECTED   ng/mg creat   N-Desmethyltramadol            3080         EXPECTED   ng/mg creat    Source of tramadol is a prescription medication.    O-desmethyltramadol and N-desmethyltramadol are expected    metabolites of tramadol. Drug Absent but Declared for Prescription Verification   Alprazolam                     Not Detected UNEXPECTED ng/mg creat ==================================================================== Test                      Result    Flag   Units      Ref Range   Creatinine              40               mg/dL       >=35 ==================================================================== Declared Medications:  The flagging and interpretation on this report are based on the  following declared medications.  Unexpected results may arise from  inaccuracies in the declared medications.  **Note: The testing scope of this panel includes these medications:  Alprazolam (Xanax)  Tramadol (Ultram)  **Note: The testing scope of this panel does not include following  reported medications:  Adalimumab (Humira)  Clonidine (Catapres)  Duloxetine (Cymbalta)  Estradiol (Estrace)  Methotrexate  Zolpidem (Ambien) ==================================================================== For clinical consultation, please call [phone]. ====================================================================    Note: Above Lab results reviewed.  Recent imaging  DG C-Arm 1-60 Min-No  Report Fluoroscopy was utilized by the requesting physician.  No radiographic  interpretation.   Assessment  The primary encounter diagnosis was Chronic pain syndrome. 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  Plan of Care  I have discontinued Marcelle SmilingKimberly D. Barren's aluminum chloride. I have also changed her traMADol, tiZANidine, and meloxicam. Additionally, I am having her maintain her Humira Pen, DULoxetine, ALPRAZolam, cloNIDine, eszopiclone, predniSONE, and pregabalin.  Pharmacotherapy (Medications Ordered): Meds ordered this encounter  Medications  . traMADol (ULTRAM) 50 MG tablet     Sig: Take 1 tablet (50 mg total) by mouth every 12 (twelve) hours as needed for up to 30 days for severe pain. Must last 30 days    Dispense:  60 tablet    Refill:  0    Chronic Pain: STOP Act - Not applicable. Fill 1 day early if closed on scheduled refill date. Do not fill until: 12/07/2018. To last until: 01/06/2019. Instruct to avoid benzodiazepines within 8 hours of opioid.  Marland Kitchen. tiZANidine (ZANAFLEX) 4 MG tablet    Sig: Take 1 tablet (4 mg total) by mouth every 8 (eight) hours as needed for up to 30 days for muscle spasms. Must last 30 days    Dispense:  90 tablet    Refill:  0    Fill one day early if pharmacy is closed on scheduled refill date. May substitute for generic if available.  . meloxicam (MOBIC) 15 MG tablet    Sig: Take 1 tablet (15 mg total) by mouth daily with breakfast for 30 days. Must last 30 days    Dispense:  30 tablet    Refill:  0    Fill one day early if pharmacy is closed on scheduled refill date. May substitute for generic if available.  . pregabalin (LYRICA) 25 MG capsule    Sig: Take 1 capsule (25 mg total) by mouth at bedtime for 7 days, THEN 2 capsules (50 mg total) at bedtime for 7 days, THEN 3 capsules (75 mg total) at bedtime for 16 days.    Dispense:  69 capsule    Refill:  0    Fill one day early if pharmacy is closed on scheduled refill date. May substitute for generic if available.   Orders:  No orders of the defined types were placed in this encounter.  Follow-up plan:   Return in about 27 days (around 01/03/2019) for (1 mo) Med-Mgmt, (Virtual Visit).    I discussed the assessment and treatment plan with the patient. The patient was provided an opportunity to ask questions and all were answered. The patient agreed with the plan and demonstrated an understanding of the instructions.  Patient advised to call back or seek an in-person evaluation if the symptoms or condition worsens.  Total duration of non-face-to-face encounter: 25 minutes.  Note  by: Oswaldo DoneFrancisco A Vaishnav Demartin, MD Date: 12/07/2018; Time: 10:27 AM  Note: This dictation was prepared with Dragon dictation. Any transcriptional errors that may result from this process are unintentional.  Disclaimer:  * Given the special circumstances of the COVID-19 pandemic, the federal government has announced that the Office for Civil Rights (  OCR) will exercise its enforcement discretion and will not impose penalties on physicians using telehealth in the event of noncompliance with regulatory requirements under the [place] and Accountability Act (HIPAA) in connection with the good faith provision of telehealth during the NATFT-73 national public health emergency. (AMA)

## 2018-12-08 ENCOUNTER — Encounter: Payer: Managed Care, Other (non HMO) | Admitting: Pain Medicine

## 2018-12-21 ENCOUNTER — Other Ambulatory Visit: Payer: Self-pay | Admitting: Rheumatology

## 2018-12-21 ENCOUNTER — Other Ambulatory Visit: Payer: Self-pay

## 2018-12-21 ENCOUNTER — Inpatient Hospital Stay: Admission: RE | Admit: 2018-12-21 | Payer: Managed Care, Other (non HMO) | Source: Ambulatory Visit

## 2018-12-21 ENCOUNTER — Ambulatory Visit
Admission: RE | Admit: 2018-12-21 | Discharge: 2018-12-21 | Disposition: A | Discharge status: Home / Self Care | Payer: Managed Care, Other (non HMO) | Source: Ambulatory Visit | Attending: Rheumatology | Admitting: Rheumatology

## 2018-12-21 DIAGNOSIS — M25511 Pain in right shoulder: Secondary | ICD-10-CM

## 2018-12-22 ENCOUNTER — Ambulatory Visit
Admission: RE | Admit: 2018-12-22 | Discharge: 2018-12-22 | Disposition: A | Discharge status: Home / Self Care | Payer: Managed Care, Other (non HMO) | Source: Ambulatory Visit | Attending: Rheumatology | Admitting: Rheumatology

## 2018-12-22 ENCOUNTER — Ambulatory Visit: Payer: Managed Care, Other (non HMO)

## 2018-12-22 DIAGNOSIS — M25511 Pain in right shoulder: Secondary | ICD-10-CM | POA: Diagnosis not present

## 2019-01-03 ENCOUNTER — Ambulatory Visit: Payer: Managed Care, Other (non HMO) | Attending: Pain Medicine | Admitting: Pain Medicine

## 2019-01-03 ENCOUNTER — Other Ambulatory Visit: Payer: Self-pay

## 2019-01-03 DIAGNOSIS — M25551 Pain in right hip: Secondary | ICD-10-CM

## 2019-01-03 DIAGNOSIS — M47816 Spondylosis without myelopathy or radiculopathy, lumbar region: Secondary | ICD-10-CM

## 2019-01-03 DIAGNOSIS — M5417 Radiculopathy, lumbosacral region: Secondary | ICD-10-CM

## 2019-01-03 DIAGNOSIS — G8929 Other chronic pain: Secondary | ICD-10-CM

## 2019-01-03 DIAGNOSIS — M5442 Lumbago with sciatica, left side: Secondary | ICD-10-CM | POA: Diagnosis not present

## 2019-01-03 DIAGNOSIS — M792 Neuralgia and neuritis, unspecified: Secondary | ICD-10-CM

## 2019-01-03 DIAGNOSIS — M5136 Other intervertebral disc degeneration, lumbar region: Secondary | ICD-10-CM

## 2019-01-03 DIAGNOSIS — M79604 Pain in right leg: Secondary | ICD-10-CM | POA: Diagnosis not present

## 2019-01-03 DIAGNOSIS — M25561 Pain in right knee: Secondary | ICD-10-CM

## 2019-01-03 DIAGNOSIS — M5441 Lumbago with sciatica, right side: Secondary | ICD-10-CM

## 2019-01-03 DIAGNOSIS — M150 Primary generalized (osteo)arthritis: Secondary | ICD-10-CM

## 2019-01-03 DIAGNOSIS — M7918 Myalgia, other site: Secondary | ICD-10-CM

## 2019-01-03 DIAGNOSIS — G894 Chronic pain syndrome: Secondary | ICD-10-CM

## 2019-01-03 DIAGNOSIS — M51369 Other intervertebral disc degeneration, lumbar region without mention of lumbar back pain or lower extremity pain: Secondary | ICD-10-CM

## 2019-01-03 DIAGNOSIS — M25562 Pain in left knee: Secondary | ICD-10-CM

## 2019-01-03 DIAGNOSIS — M79605 Pain in left leg: Secondary | ICD-10-CM

## 2019-01-03 DIAGNOSIS — M48061 Spinal stenosis, lumbar region without neurogenic claudication: Secondary | ICD-10-CM

## 2019-01-03 DIAGNOSIS — M159 Polyosteoarthritis, unspecified: Secondary | ICD-10-CM

## 2019-01-03 DIAGNOSIS — M25552 Pain in left hip: Secondary | ICD-10-CM

## 2019-01-03 MED ORDER — MELOXICAM 15 MG PO TABS: 15.0000 mg | ORAL_TABLET | Freq: Every day | ORAL | 0 refills | Status: DC

## 2019-01-03 MED ORDER — TRAMADOL HCL 50 MG PO TABS: 50.0000 mg | ORAL_TABLET | Freq: Two times a day (BID) | ORAL | 0 refills | Status: DC | PRN

## 2019-01-03 MED ORDER — PREGABALIN 25 MG PO CAPS: 25.0000 mg | ORAL_CAPSULE | Freq: Three times a day (TID) | ORAL | 0 refills | Status: DC

## 2019-01-03 MED ORDER — TIZANIDINE HCL 4 MG PO TABS: 4.0000 mg | ORAL_TABLET | Freq: Three times a day (TID) | ORAL | 0 refills | Status: DC | PRN

## 2019-01-03 NOTE — Patient Instructions (Signed)
____________________________________________________________________________________________  Medication Recommendations and Reminders  Applies to: All patients receiving prescriptions (written and/or electronic).  Medication Rules & Regulations: These rules and regulations exist for your safety and that of others. They are not flexible and neither are we. Dismissing or ignoring them will be considered "non-compliance" with medication therapy, resulting in complete and irreversible termination of such therapy. (See document titled "Medication Rules" for more details.) In all conscience, because of safety reasons, we cannot continue providing a therapy where the patient does not follow instructions.  Pharmacy of record:   Definition: This is the pharmacy where your electronic prescriptions will be sent.   We do not endorse any particular pharmacy.  You are not restricted in your choice of pharmacy.  The pharmacy listed in the electronic medical record should be the one where you want electronic prescriptions to be sent.  If you choose to change pharmacy, simply notify our nursing staff of your choice of new pharmacy.  Recommendations:  Keep all of your pain medications in a safe place, under lock and key, even if you live alone.   After you fill your prescription, take 1 week's worth of pills and put them away in a safe place. You should keep a separate, properly labeled bottle for this purpose. The remainder should be kept in the original bottle. Use this as your primary supply, until it runs out. Once it's gone, then you know that you have 1 week's worth of medicine, and it is time to come in for a prescription refill. If you do this correctly, it is unlikely that you will ever run out of medicine.  To make sure that the above recommendation works, it is very important that you make sure your medication refill appointments are scheduled at least 1 week before you run out of medicine. To do  this in an effective manner, make sure that you do not leave the office without scheduling your next medication management appointment. Always ask the nursing staff to show you in your prescription , when your medication will be running out. Then arrange for the receptionist to get you a return appointment, at least 7 days before you run out of medicine. Do not wait until you have 1 or 2 pills left, to come in. This is very poor planning and does not take into consideration that we may need to cancel appointments due to bad weather, sickness, or emergencies affecting our staff.  "Partial Fill": If for any reason your pharmacy does not have enough pills/tablets to completely fill or refill your prescription, do not allow for a "partial fill". You will need a separate prescription to fill the remaining amount, which we will not provide. If the reason for the partial fill is your insurance, you will need to talk to the pharmacist about payment alternatives for the remaining tablets, but again, do not accept a partial fill.  Prescription refills and/or changes in medication(s):   Prescription refills, and/or changes in dose or medication, will be conducted only during scheduled medication management appointments. (Applies to both, written and electronic prescriptions.)  No refills on procedure days. No medication will be changed or started on procedure days. No changes, adjustments, and/or refills will be conducted on a procedure day. Doing so will interfere with the diagnostic portion of the procedure.  No phone refills. No medications will be "called into the pharmacy".  No Fax refills.  No weekend refills.  No Holliday refills.  No after hours refills.  Remember:  Business   hours are:  Monday to Thursday 8:00 AM to 4:00 PM Provider's Schedule: Thad Ranger, NP - Appointments are:  Medication management: Monday to Thursday 8:00 AM to 4:00 PM Delano Metz, MD - Appointments are:   Medication management: Monday and Wednesday 8:00 AM to 4:00 PM Procedure day: Tuesday and Thursday 7:30 AM to 4:00 PM Edward Jolly, MD - Appointments are:  Medication management: Tuesday and Thursday 8:00 AM to 4:00 PM Procedure day: Monday and Wednesday 7:30 AM to 4:00 PM (Last update: 09/03/2017) ____________________________________________________________________________________________   ____________________________________________________________________________________________  Medication Rules  Purpose: To inform patients, and their family members, of our rules and regulations.  Applies to: All patients receiving prescriptions (written or electronic).  Pharmacy of record: Pharmacy where electronic prescriptions will be sent. If written prescriptions are taken to a different pharmacy, please inform the nursing staff. The pharmacy listed in the electronic medical record should be the one where you would like electronic prescriptions to be sent.  Electronic prescriptions: In compliance with the [facility] Strengthen Opioid Misuse Prevention (STOP) Act of 2017 (Session Conni Elliot [phone]), effective July 07, 2018, all controlled substances must be electronically prescribed. Calling prescriptions to the pharmacy will cease to exist.  Prescription refills: Only during scheduled appointments. Applies to all prescriptions.  NOTE: The following applies primarily to controlled substances (Opioid* Pain Medications).   Patient's responsibilities: 1. Pain Pills: Bring all pain pills to every appointment (except for procedure appointments). 2. Pill Bottles: Bring pills in original pharmacy bottle. Always bring the newest bottle. Bring bottle, even if empty. 3. Medication refills: You are responsible for knowing and keeping track of what medications you take and those you need refilled. The day before your appointment: write a list of all prescriptions that need to be refilled. The day of  the appointment: give the list to the admitting nurse. Prescriptions will be written only during appointments. No prescriptions will be written on procedure days. If you forget a medication: it will not be "Called in", "Faxed", or "electronically sent". You will need to get another appointment to get these prescribed. No early refills. Do not call asking to have your prescription filled early. 4. Prescription Accuracy: You are responsible for carefully inspecting your prescriptions before leaving our office. Have the discharge nurse carefully go over each prescription with you, before taking them home. Make sure that your name is accurately spelled, that your address is correct. Check the name and dose of your medication to make sure it is accurate. Check the number of pills, and the written instructions to make sure they are clear and accurate. Make sure that you are given enough medication to last until your next medication refill appointment. 5. Taking Medication: Take medication as prescribed. When it comes to controlled substances, taking less pills or less frequently than prescribed is permitted and encouraged. Never take more pills than instructed. Never take medication more frequently than prescribed.  6. Inform other Doctors: Always inform, all of your healthcare providers, of all the medications you take. 7. Pain Medication from other Providers: You are not allowed to accept any additional pain medication from any other Doctor or Healthcare provider. There are two exceptions to this rule. (see below) In the event that you require additional pain medication, you are responsible for notifying us, as stated below. 8. Medication Agreement: You are responsible for carefully reading and following our Medication Agreement. This must be signed before receiving any prescriptions from our practice. Safely store a copy of your signed Agreement. Violations to  the Agreement will result in no further  prescriptions. (Additional copies of our Medication Agreement are available upon request.) 9. Laws, Rules, & Regulations: All patients are expected to follow all Federal and [employer], [employer], Rules, [employer]. Ignorance of the Laws does not constitute a valid excuse. The use of any illegal substances is prohibited. 10. Adopted CDC guidelines & recommendations: Target dosing levels will be at or below 60 MME/day. Use of benzodiazepines** is not recommended.  Exceptions: There are only two exceptions to the rule of not receiving pain medications from other Healthcare Providers. 1. Exception #1 (Emergencies): In the event of an emergency (i.e.: accident requiring emergency care), you are allowed to receive additional pain medication. However, you are responsible for: As soon as you are able, call our office [phone], at any time of the day or night, and leave a message stating your name, the date and nature of the emergency, and the name and dose of the medication prescribed. In the event that your call is answered by a member of our staff, make sure to document and save the date, time, and the name of the person that took your information.  2. Exception #2 (Planned Surgery): In the event that you are scheduled by another doctor or dentist to have any type of surgery or procedure, you are allowed (for a period no longer than 30 days), to receive additional pain medication, for the acute post-op pain. However, in this case, you are responsible for picking up a copy of our "Post-op Pain Management for Surgeons" handout, and giving it to your surgeon or dentist. This document is available at our office, and does not require an appointment to obtain it. Simply go to our office during business hours (Monday-Thursday from 8:00 AM to 4:00 PM) (Friday 8:00 AM to 12:00 Noon) or if you have a scheduled appointment with [place], prior to your surgery, and ask for it by name. In addition, you will need to provide [place]  with your name, name of your surgeon, type of surgery, and date of procedure or surgery.  *Opioid medications include: morphine, codeine, oxycodone, oxymorphone, hydrocodone, hydromorphone, meperidine, tramadol, tapentadol, buprenorphine, fentanyl, methadone. **Benzodiazepine medications include: diazepam (Valium), alprazolam (Xanax), clonazepam (Klonopine), lorazepam (Ativan), clorazepate (Tranxene), chlordiazepoxide (Librium), estazolam (Prosom), oxazepam (Serax), temazepam (Restoril), triazolam (Halcion) (Last updated: 09/03/2017) ____________________________________________________________________________________________

## 2019-01-03 NOTE — Progress Notes (Signed)
Pain Management Virtual Encounter Note - Virtual Visit via Telephone Telehealth (real-time audio visits between healthcare provider and patient).   Patient's Phone No. & Preferred Pharmacy:  [phone] (home); [phone] (mobile); (Preferred) [phone] [email or web address] .[id number] [place], [place] - [address] AT [facility] OF [address] [place] [address] Phone: [phone] Fax: [phone]    Pre-screening note:  Our staff contacted Candice Guzman and offered her an "in person", "face-to-face" appointment versus a telephone encounter. She indicated preferring the telephone encounter, at this time.   Reason for Virtual Visit: COVID-19*  Social distancing based on CDC and AMA recommendations.   I contacted [name] on 01/03/2019 via telephone.      I clearly identified myself as [name], MD. I verified that I was speaking with the correct person using two identifiers (Name: Candice Guzman, and date of birth: Jan 09, 1961).  Advanced Informed Consent I sought verbal advanced consent from [name] for virtual visit interactions. I informed [name] of possible security and privacy concerns, risks, and limitations associated with providing "not-in-person" medical evaluation and management services. I also informed [name] of the availability of "in-person" appointments. Finally, I informed her that there would be a charge for the virtual visit and that she could be  personally, fully or partially, financially responsible for it. [name] expressed understanding and agreed to proceed.   Historic Elements   [name] is a 58 y.o. year old, female patient evaluated today after her last encounter by our practice on 12/07/2018. Candice Guzman  has a past medical history of Depression, Insomnia, Lumbar herniated disc, Menopause, Rheumatoid arthritis (HCC), and Vitamin D deficiency. She  also  has a past surgical history that includes Ganglion cyst excision; Cesarean section; Breast biopsy (Left, 2010); Metacarpophalangeal joint arthroplasty (Right, 06/06/2016); and Finger arthroplasty (Left, 07/24/2017). Candice Guzman has a current medication list which includes the following prescription(s): alprazolam, clonidine, duloxetine, eszopiclone, humira pen, meloxicam, pregabalin, tizanidine, and tramadol. She  reports that she has never smoked. She has never used smokeless tobacco. She reports that she does not drink alcohol or use drugs. Candice Guzman is allergic to codeine and methotrexate.   HPI  Today, she is being contacted for medication management.  The patient indicates that she is doing much better with the Lyrica but she still taking only 25 mg daily.  I have encouraged the patient to try going up to twice daily, followed by 3 times daily, as tolerated.  It has also come to our attention that the patient recently had an issue with her shoulder and she went to [facility] to have it evaluated.  They went ahead and did some x-rays and apparently also did an injection into the shoulder.  I have reminded the patient that as a full service pain management clinic, we offer all types of injection therapies and we rather have the patient's have all of their interventional therapies with [place] so that we can keep track of the total amount of steroids that they are receiving at any given time.  I have decided to her cases that we have seen where the patient is have had injections done by multiple physicians without adequate communication, which has resulted in problems with the steroid such as Addison's disease.  She is pending a follow-up with Patience Musca, PA-C, and I have sent him a letter reminding him of our preferences.  In  reviewing the patient's PMP I am concerned that I am seen her receiving benzodiazepines and other opioid analgesics from physicians other than me.  This is a violation of our  medication agreement and should discontinue, we will need to stop prescribing for this patient.  Pharmacotherapy Assessment  Analgesic: Tramadol 50 mg 1-2 tablet p.o. twice daily (100 mg/day of tramadol) MME/day: 10 mg/day.   Monitoring: Pharmacotherapy: No side-effects or adverse reactions reported. [place] PMP: PDMP reviewed during this encounter. The patient has added some benzodiazepines to her regimen as well as obtain some medicines from physicians other than this practice.  Today I have provided her with another copy of our medication policy for the patient to review. Compliance: No problems identified. Effectiveness: Clinically acceptable. Plan: Refer to "POC".  Pertinent Labs   SAFETY SCREENING Profile No results found for: SARSCOV2NAA, COVIDSOURCE, STAPHAUREUS, MRSAPCR, HCVAB, HIV, PREGTESTUR   Renal Function Lab Results  Component Value Date   BUN 10 11/26/2018   CREATININE 0.87 11/26/2018   BCR 11 11/26/2018   GFRAA 86 11/26/2018   GFRNONAA 74 11/26/2018   Hepatic Function Lab Results  Component Value Date   AST 27 11/26/2018   ALT 26 11/26/2018   ALBUMIN 4.2 11/26/2018   UDS Summary  Date Value Ref Range Status  01/05/2018 FINAL  Final    Comment:    ==================================================================== TOXASSURE SELECT 13 (MW) ==================================================================== Test                             Result       Flag       Units Drug Present and Declared for Prescription Verification   Tramadol                       2773         EXPECTED   ng/mg creat   O-Desmethyltramadol            6615         EXPECTED   ng/mg creat   N-Desmethyltramadol            3080         EXPECTED   ng/mg creat    Source of tramadol is a prescription medication.    O-desmethyltramadol and N-desmethyltramadol are expected    metabolites of tramadol. Drug Absent but Declared for Prescription Verification   Alprazolam                     Not  Detected UNEXPECTED ng/mg creat ==================================================================== Test                      Result    Flag   Units      Ref Range   Creatinine              40               mg/dL      >=19 ==================================================================== Declared Medications:  The flagging and interpretation on this report are based on the  following declared medications.  Unexpected results may arise from  inaccuracies in the declared medications.  **Note: The testing scope of this panel includes these medications:  Alprazolam (Xanax)  Tramadol (Ultram)  **Note: The testing scope of this panel does not include following  reported medications:  Adalimumab (Humira)  Clonidine (Catapres)  Duloxetine (Cymbalta)  Estradiol (Estrace)  Methotrexate  Zolpidem (  Ambien) ==================================================================== For clinical consultation, please call [phone]. ====================================================================    Note: Above Lab results reviewed.  Recent imaging  MR SHOULDER RIGHT WO CONTRAST CLINICAL DATA:  Right shoulder pain with decreased range of motion following lifting injury 1 week ago.  EXAM: MRI OF THE RIGHT SHOULDER WITHOUT CONTRAST  TECHNIQUE: Multiplanar, multisequence MR imaging of the shoulder was performed. No intravenous contrast was administered.  COMPARISON:  Right shoulder MRI 06/28/2007.  FINDINGS: Rotator cuff: Supraspinatus tendon demonstrates progressive tendinosis with new bursal surface tearing in the critical zone, best seen on the coronal images. There is distal extension of intrasubstance partial tearing, but no full-thickness tendon tear or tendon retraction. There is mild infraspinatus tendinosis without focal tear. The subscapularis and teres minor tendons appear normal.  Muscles: There is diffuse T2 hyperintensity inferiorly within the infraspinatus muscle  which is not typical for sentinel cyst formation and may be related to previous muscular injury. There is some surrounding muscular edema, but no significant atrophy. The additional rotator cuff muscles appear normal. There is mild T2 hyperintensity posteriorly in the deltoid muscle as well.  Biceps long head:  Intact and normally positioned.  Acromioclavicular Joint: The acromion is type 2. There are mild acromioclavicular degenerative changes. There is a large amount of complex fluid in the subacromial-subdeltoid bursa.  Glenohumeral Joint: Small shoulder joint effusion. No significant glenohumeral arthropathy.  Labrum:  No evidence of labral tear or paralabral cyst.  Bones: No acute or significant extra-articular osseous findings.  Other: Mild subcutaneous edema surrounding the shoulder, greatest posteriorly.  IMPRESSION: 1. Progressive supraspinatus tendinosis with partial intrasubstance and bursal surface tearing. No full-thickness rotator cuff tear. 2. Large amount of complex fluid in the subacromial-subdeltoid bursa consistent with severe bursitis. 3. Probable muscular injury involving the infraspinatus muscle with associated intramuscular cyst formation and surrounding edema. The infraspinatus tendon appears intact. 4. The labrum and biceps tendon appear intact.  Electronically Signed   By: Carey BullocksWilliam  Veazey M.D.   On: 12/22/2018 11:23  Assessment  The primary encounter diagnosis was Chronic pain syndrome. 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  Plan of Care  I have discontinued Marcelle SmilingKimberly D. Frieling's predniSONE. I have also changed her pregabalin. Additionally, I am having her maintain her Humira Pen, DULoxetine, ALPRAZolam, cloNIDine, eszopiclone, tiZANidine, traMADol, and meloxicam.  Pharmacotherapy (Medications Ordered): Meds ordered this encounter  Medications  . tiZANidine (ZANAFLEX) 4 MG tablet    Sig: Take 1 tablet (4 mg total) by mouth every 8 (eight) hours as needed for up to 30 days for muscle spasms. Must last 30 days    Dispense:  90 tablet    Refill:  0    Fill one day early if pharmacy is closed on scheduled refill date. May substitute for generic if available.  . traMADol (ULTRAM) 50 MG tablet    Sig: Take 1 tablet (50 mg total) by mouth every 12 (twelve) hours as needed for up to 30 days for severe pain. Must last 30 days    Dispense:  60 tablet    Refill:  0  Chronic Pain: STOP Act - Not applicable. Fill 1 day early if closed on scheduled refill date. Do not fill until: 01/06/2019. To last until: 02/05/2019. Instruct to avoid benzodiazepines within 8 hours of opioid.  . pregabalin (LYRICA) 25 MG capsule    Sig: Take 1 capsule (25 mg total) by mouth 3 (three) times daily for 30 days.    Dispense:  90 capsule    Refill:  0    Fill one day early if pharmacy is closed on scheduled refill date. May substitute for generic if available.  . meloxicam (MOBIC) 15 MG tablet    Sig: Take 1 tablet (15 mg total) by mouth daily with breakfast for 30 days. Must last 30 days    Dispense:  30 tablet    Refill:  0    Fill one day early if pharmacy is closed on scheduled refill date. May substitute for generic if available.   Orders:  No orders of the defined types were placed in this encounter.  Follow-up plan:   Return in about 1 month (around 02/02/2019) for (VV), E/M (MM).    Recent Visits Date Type Provider Dept   12/07/18 Office Visit Milinda Pointer, MD [facility]  Showing recent visits within past 90 days and meeting all other requirements   Today's Visits Date Type Provider Dept  01/03/19 Office Visit Milinda Pointer, MD [facility]  Showing today's visits and meeting all other requirements   Future Appointments No visits were found meeting these conditions.  Showing future appointments within next 90 days and meeting all other requirements   I discussed the assessment and treatment plan with the patient. The patient was provided an opportunity to ask questions and all were answered. The patient agreed with the plan and demonstrated an understanding of the instructions.  Patient advised to call back or seek an in-person evaluation if the symptoms or condition worsens.  Total duration of non-face-to-face encounter: 15 minutes.  Note by: Gaspar Cola, MD Date: 01/03/2019; Time: 2:57 PM  Note: This dictation was prepared with Dragon dictation. Any transcriptional errors that may result from this process are unintentional.  Disclaimer:  * Given the special circumstances of the COVID-19 pandemic, the federal government has announced that the Office for Civil Rights (OCR) will exercise its enforcement discretion and will not impose penalties on physicians using telehealth in the event of noncompliance with regulatory requirements under the [place] (HIPAA) in connection with the good faith provision of telehealth during the ENIDP-82 national public health emergency. ([place])

## 2019-01-18 DIAGNOSIS — M6281 Muscle weakness (generalized): Secondary | ICD-10-CM | POA: Insufficient documentation

## 2019-01-18 DIAGNOSIS — M25611 Stiffness of right shoulder, not elsewhere classified: Secondary | ICD-10-CM | POA: Insufficient documentation

## 2019-01-18 DIAGNOSIS — M25511 Pain in right shoulder: Secondary | ICD-10-CM | POA: Insufficient documentation

## 2019-01-31 ENCOUNTER — Other Ambulatory Visit: Payer: Self-pay | Admitting: Pain Medicine

## 2019-01-31 DIAGNOSIS — G8929 Other chronic pain: Secondary | ICD-10-CM

## 2019-01-31 DIAGNOSIS — M792 Neuralgia and neuritis, unspecified: Secondary | ICD-10-CM

## 2019-01-31 DIAGNOSIS — M7918 Myalgia, other site: Secondary | ICD-10-CM

## 2019-02-02 ENCOUNTER — Encounter: Payer: Managed Care, Other (non HMO) | Admitting: Pain Medicine

## 2019-02-09 ENCOUNTER — Encounter: Payer: Self-pay | Admitting: Pain Medicine

## 2019-02-10 ENCOUNTER — Telehealth: Payer: Self-pay | Admitting: *Deleted

## 2019-02-10 ENCOUNTER — Other Ambulatory Visit: Payer: Self-pay

## 2019-02-10 ENCOUNTER — Ambulatory Visit: Payer: Managed Care, Other (non HMO) | Attending: Pain Medicine | Admitting: Pain Medicine

## 2019-02-10 ENCOUNTER — Encounter: Payer: Self-pay | Admitting: Pain Medicine

## 2019-02-10 DIAGNOSIS — M792 Neuralgia and neuritis, unspecified: Secondary | ICD-10-CM

## 2019-02-10 DIAGNOSIS — M79604 Pain in right leg: Secondary | ICD-10-CM

## 2019-02-10 DIAGNOSIS — M25562 Pain in left knee: Secondary | ICD-10-CM

## 2019-02-10 DIAGNOSIS — M7918 Myalgia, other site: Secondary | ICD-10-CM

## 2019-02-10 DIAGNOSIS — M5442 Lumbago with sciatica, left side: Secondary | ICD-10-CM

## 2019-02-10 DIAGNOSIS — G894 Chronic pain syndrome: Secondary | ICD-10-CM

## 2019-02-10 DIAGNOSIS — M79605 Pain in left leg: Secondary | ICD-10-CM

## 2019-02-10 DIAGNOSIS — M150 Primary generalized (osteo)arthritis: Secondary | ICD-10-CM

## 2019-02-10 DIAGNOSIS — M25551 Pain in right hip: Secondary | ICD-10-CM | POA: Diagnosis not present

## 2019-02-10 DIAGNOSIS — M25561 Pain in right knee: Secondary | ICD-10-CM

## 2019-02-10 DIAGNOSIS — M25552 Pain in left hip: Secondary | ICD-10-CM

## 2019-02-10 DIAGNOSIS — M159 Polyosteoarthritis, unspecified: Secondary | ICD-10-CM

## 2019-02-10 DIAGNOSIS — M47816 Spondylosis without myelopathy or radiculopathy, lumbar region: Secondary | ICD-10-CM

## 2019-02-10 DIAGNOSIS — M5441 Lumbago with sciatica, right side: Secondary | ICD-10-CM

## 2019-02-10 DIAGNOSIS — G8929 Other chronic pain: Secondary | ICD-10-CM

## 2019-02-10 MED ORDER — PREGABALIN 50 MG PO CAPS: 50.0000 mg | ORAL_CAPSULE | Freq: Three times a day (TID) | ORAL | 1 refills | Status: DC

## 2019-02-10 MED ORDER — TIZANIDINE HCL 4 MG PO TABS: 4.0000 mg | ORAL_TABLET | Freq: Three times a day (TID) | ORAL | 5 refills | Status: DC | PRN

## 2019-02-10 MED ORDER — MELOXICAM 15 MG PO TABS: 15.0000 mg | ORAL_TABLET | Freq: Every day | ORAL | 1 refills | Status: DC

## 2019-02-10 MED ORDER — TRAMADOL HCL 50 MG PO TABS: 50.0000 mg | ORAL_TABLET | Freq: Two times a day (BID) | ORAL | 5 refills | Status: DC | PRN

## 2019-02-10 NOTE — Telephone Encounter (Signed)
Attempted to call for pre appointment assessment. Message left. 

## 2019-02-10 NOTE — Progress Notes (Signed)
Pain Management Virtual Encounter Note - Virtual Visit via Telephone Telehealth (real-time audio visits between healthcare provider and patient).   Patient's Phone No. & Preferred Pharmacy:  [phone] (home); [phone] (mobile); (Preferred) [phone] [email or web address]  [facility] [id number], [address] [place] [address] Phone: [phone] Fax: [phone]    Pre-screening note:  Our staff contacted Candice Guzman and offered her an "in person", "face-to-face" appointment versus a telephone encounter. She indicated preferring the telephone encounter, at this time.   Reason for Virtual Visit: COVID-19*  Social distancing based on CDC and AMA recommendations.   I contacted Candice Guzman on 02/10/2019 via telephone.      I clearly identified myself as Gaspar Cola, MD. I verified that I was speaking with the correct person using two identifiers (Name: Candice Guzman, and date of birth: [birth year]).  Advanced Informed Consent I sought verbal advanced consent from Candice Guzman for virtual visit interactions. I informed Candice Guzman of possible security and privacy concerns, risks, and limitations associated with providing "not-in-person" medical evaluation and management services. I also informed Candice Guzman of the availability of "in-person" appointments. Finally, I informed her that there would be a charge for the virtual visit and that she could be  personally, fully or partially, financially responsible for it. Candice Guzman expressed understanding and agreed to proceed.   Historic Elements   Candice Guzman is a 58 y.o. year old, female patient evaluated today after her last encounter by our practice on 01/31/2019. Candice Guzman  has a past medical history of Depression, Insomnia, Lumbar herniated disc, Menopause, Rheumatoid arthritis ([place]), and Vitamin D deficiency. She  also  has a past surgical history that includes Ganglion cyst excision; Cesarean section; Breast biopsy (Left, 2010); Metacarpophalangeal joint arthroplasty (Right, 06/06/2016); and Finger arthroplasty (Left, 07/24/2017). Candice Guzman has a current medication list which includes the following prescription(s): alprazolam, clonidine, duloxetine, eszopiclone, humira pen, meloxicam, pregabalin, tizanidine, and tramadol. She  reports that she has never smoked. She has never used smokeless tobacco. She reports that she does not drink alcohol or use drugs. Candice Guzman is allergic to codeine and methotrexate.   HPI  Today, she is being contacted for medication management.  The patient indicates that she recently ran out of the Lyrica and has noticed a huge difference when she did not have it available.  Today I have offered to go from the 25 mg p.o. 3 times daily to 50 mg p.o. 3 times daily since she indicates having no side effects or adverse reactions to the use of this medication.  Pharmacotherapy Assessment  Analgesic: Tramadol 50 mg 1-2tablet p.o. twice daily (100mg /day of tramadol) MME/day:10mg /day.   Monitoring: Pharmacotherapy: No side-effects or adverse reactions reported. [place] PMP: PDMP not reviewed this encounter.       Compliance: No problems identified. Effectiveness: Clinically acceptable. Plan: Refer to "POC".  UDS:  Summary  Date Value Ref Range Status  01/05/2018 FINAL  Final    Comment:    ==================================================================== TOXASSURE SELECT 13 (MW) ==================================================================== Test                             Result       Flag       Units Drug Present and Declared for Prescription Verification   Tramadol  2773         EXPECTED   ng/mg creat   O-Desmethyltramadol            6615         EXPECTED   ng/mg creat   N-Desmethyltramadol            3080         EXPECTED   ng/mg creat    Source  of tramadol is a prescription medication.    O-desmethyltramadol and N-desmethyltramadol are expected    metabolites of tramadol. Drug Absent but Declared for Prescription Verification   Alprazolam                     Not Detected UNEXPECTED ng/mg creat ==================================================================== Test                      Result    Flag   Units      Ref Range   Creatinine              40               mg/dL      >=15 ==================================================================== Declared Medications:  The flagging and interpretation on this report are based on the  following declared medications.  Unexpected results may arise from  inaccuracies in the declared medications.  **Note: The testing scope of this panel includes these medications:  Alprazolam (Xanax)  Tramadol (Ultram)  **Note: The testing scope of this panel does not include following  reported medications:  Adalimumab (Humira)  Clonidine (Catapres)  Duloxetine (Cymbalta)  Estradiol (Estrace)  Methotrexate  Zolpidem (Ambien) ==================================================================== For clinical consultation, please call [phone]. ====================================================================    Laboratory Chemistry Profile (12 mo)  Renal: 11/26/2018: BUN 10; BUN/Creatinine Ratio 11; Creatinine, Ser 0.87  Lab Results  Component Value Date   GFRAA 86 11/26/2018   GFRNONAA 74 11/26/2018   Hepatic: 11/26/2018: Albumin 4.2 Lab Results  Component Value Date   AST 27 11/26/2018   ALT 26 11/26/2018   Other: 11/26/2018: Vitamin B-12 356 Note: Above Lab results reviewed.  Imaging  Last 90 days:  Mr Shoulder Right Wo Contrast  Result Date: 12/22/2018 CLINICAL DATA:  Right shoulder pain with decreased range of motion following lifting injury 1 week ago. EXAM: MRI OF THE RIGHT SHOULDER WITHOUT CONTRAST TECHNIQUE: Multiplanar, multisequence MR imaging of the shoulder was  performed. No intravenous contrast was administered. COMPARISON:  Right shoulder MRI 06/28/2007. FINDINGS: Rotator cuff: Supraspinatus tendon demonstrates progressive tendinosis with new bursal surface tearing in the critical zone, best seen on the coronal images. There is distal extension of intrasubstance partial tearing, but no full-thickness tendon tear or tendon retraction. There is mild infraspinatus tendinosis without focal tear. The subscapularis and teres minor tendons appear normal. Muscles: There is diffuse T2 hyperintensity inferiorly within the infraspinatus muscle which is not typical for sentinel cyst formation and may be related to previous muscular injury. There is some surrounding muscular edema, but no significant atrophy. The additional rotator cuff muscles appear normal. There is mild T2 hyperintensity posteriorly in the deltoid muscle as well. Biceps long head:  Intact and normally positioned. Acromioclavicular Joint: The acromion is type 2. There are mild acromioclavicular degenerative changes. There is a large amount of complex fluid in the subacromial-subdeltoid bursa. Glenohumeral Joint: Small shoulder joint effusion. No significant glenohumeral arthropathy. Labrum:  No evidence of labral tear or paralabral cyst. Bones: No acute or significant extra-articular osseous findings.  Other: Mild subcutaneous edema surrounding the shoulder, greatest posteriorly. IMPRESSION: 1. Progressive supraspinatus tendinosis with partial intrasubstance and bursal surface tearing. No full-thickness rotator cuff tear. 2. Large amount of complex fluid in the subacromial-subdeltoid bursa consistent with severe bursitis. 3. Probable muscular injury involving the infraspinatus muscle with associated intramuscular cyst formation and surrounding edema. The infraspinatus tendon appears intact. 4. The labrum and biceps tendon appear intact. Electronically Signed   By: Carey BullocksWilliam  Veazey M.D.   On: 12/22/2018 11:23   Last  Hospital Admission:  Mr Shoulder Right Wo Contrast  Result Date: 12/22/2018 CLINICAL DATA:  Right shoulder pain with decreased range of motion following lifting injury 1 week ago. EXAM: MRI OF THE RIGHT SHOULDER WITHOUT CONTRAST TECHNIQUE: Multiplanar, multisequence MR imaging of the shoulder was performed. No intravenous contrast was administered. COMPARISON:  Right shoulder MRI 06/28/2007. FINDINGS: Rotator cuff: Supraspinatus tendon demonstrates progressive tendinosis with new bursal surface tearing in the critical zone, best seen on the coronal images. There is distal extension of intrasubstance partial tearing, but no full-thickness tendon tear or tendon retraction. There is mild infraspinatus tendinosis without focal tear. The subscapularis and teres minor tendons appear normal. Muscles: There is diffuse T2 hyperintensity inferiorly within the infraspinatus muscle which is not typical for sentinel cyst formation and may be related to previous muscular injury. There is some surrounding muscular edema, but no significant atrophy. The additional rotator cuff muscles appear normal. There is mild T2 hyperintensity posteriorly in the deltoid muscle as well. Biceps long head:  Intact and normally positioned. Acromioclavicular Joint: The acromion is type 2. There are mild acromioclavicular degenerative changes. There is a large amount of complex fluid in the subacromial-subdeltoid bursa. Glenohumeral Joint: Small shoulder joint effusion. No significant glenohumeral arthropathy. Labrum:  No evidence of labral tear or paralabral cyst. Bones: No acute or significant extra-articular osseous findings. Other: Mild subcutaneous edema surrounding the shoulder, greatest posteriorly. IMPRESSION: 1. Progressive supraspinatus tendinosis with partial intrasubstance and bursal surface tearing. No full-thickness rotator cuff tear. 2. Large amount of complex fluid in the subacromial-subdeltoid bursa consistent with severe  bursitis. 3. Probable muscular injury involving the infraspinatus muscle with associated intramuscular cyst formation and surrounding edema. The infraspinatus tendon appears intact. 4. The labrum and biceps tendon appear intact. Electronically Signed   By: Carey BullocksWilliam  Veazey M.D.   On: 12/22/2018 11:23   Assessment  The primary encounter diagnosis was Chronic pain syndrome. 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  Plan of Care  I have changed Candice SmilingKimberly D. Candice Guzman's tiZANidine, pregabalin, traMADol, and meloxicam. I am also having her maintain her Humira Pen, DULoxetine, ALPRAZolam, cloNIDine, and eszopiclone.  Pharmacotherapy (Medications Ordered): Meds ordered this encounter  Medications  . tiZANidine (ZANAFLEX) 4 MG tablet    Sig: Take 1 tablet (4 mg total) by mouth every 8 (eight) hours as needed for muscle spasms. Must last 30 days    Dispense:  90 tablet    Refill:  5    Fill one day early if pharmacy is closed on scheduled refill date. May substitute for generic if available.  . pregabalin (LYRICA) 50 MG capsule    Sig: Take 1 capsule (50 mg total) by  mouth 3 (three) times daily.    Dispense:  270 capsule    Refill:  1    Fill one day early if pharmacy is closed on scheduled refill date. May substitute for generic if available.  . traMADol (ULTRAM) 50 MG tablet    Sig: Take 1 tablet (50 mg total) by mouth every 12 (twelve) hours as needed for severe pain. Must last 30 days    Dispense:  60 tablet    Refill:  5    Chronic Pain: STOP Act (Not applicable) Fill 1 day early if closed on refill  date. Do not fill until: 02/10/2019. To last until: 08/09/2019. Avoid benzodiazepines within 8 hours of opioids  . meloxicam (MOBIC) 15 MG tablet    Sig: Take 1 tablet (15 mg total) by mouth daily with breakfast. Must last 30 days    Dispense:  90 tablet    Refill:  1    Fill one day early if pharmacy is closed on scheduled refill date. May substitute for generic if available.   Orders:  No orders of the defined types were placed in this encounter.  Follow-up plan:   Return in about 6 months (around 08/08/2019) for (VV), E/M (MM).      Interventional management options: Planned, scheduled, and/or pending: Therapeutic right-sided L4-5 interlaminar LESI under fluoroscopic guidance, no sedation Diagnostic right L5 transforaminal ESI#3under fluoroscopic guidance no sedation   Considering: Diagnostic bilateral L5 transforaminal LESI Diagnostic bilateral lumbar facet block Possible bilateral lumbar facet RFA Diagnostic bilateral intra-articular hip joint injection Diagnostic bilateral femoral nerve block + obturator nerve block Possible bilateral femoral nerve + obturator nerve RFA Diagnostic bilateral intra-articular shoulder joint injection Diagnostic bilateral suprascapular nerve block Possible bilateral suprascapular nerve RFA Diagnostic Bilateral intra-articular wrist injections Diagnosticbilateral intra-articular knee injections Possible bilateral intra-articular Hyalgan knee injection Diagnostic bilateral genicular nerve block Possible bilateral genicular nerve RFA   Palliative PRN treatment(s): Palliative/therapeuticbilateral L5 transforaminal ESI#3under fluoroscopic guidance and IV sedation    Recent Visits Date Type Provider Dept  01/03/19 Office Visit Delano MetzNaveira, Darah Simkin, MD [facility]  12/07/18 Office Visit Delano MetzNaveira, Jaleena Viviani, MD [facility]  Showing recent visits within past 90 days and meeting all other requirements    Today's Visits Date Type Provider Dept  02/10/19 Office Visit Delano MetzNaveira, Karman Veney, MD [facility]  Showing today's visits and meeting all other requirements   Future Appointments No visits were found meeting these conditions.  Showing future appointments within next 90 days and meeting all other requirements   I discussed the assessment and treatment plan with the patient. The patient was provided an opportunity to ask questions and all were answered. The patient agreed with the plan and demonstrated an understanding of the instructions.  Patient advised to call back or seek an in-person evaluation if the symptoms or condition worsens.  Total duration of non-face-to-face encounter: 15 minutes.  Note by: Oswaldo DoneFrancisco A Abia Monaco, MD Date: 02/10/2019; Time: 2:29 PM  Note: This dictation was prepared with Dragon dictation. Any transcriptional errors that may result from this process are unintentional.  Disclaimer:  * Given the special circumstances of the COVID-19 pandemic, the federal government has announced that the Office for Civil Rights (OCR) will exercise its enforcement discretion and will not impose penalties on physicians using telehealth in the event of noncompliance with regulatory requirements under the [employer] Insurance Portability and Accountability Act (HIPAA) in connection with the good faith provision of telehealth during the COVID-19 national public health emergency. (AMA)

## 2019-02-11 ENCOUNTER — Other Ambulatory Visit: Payer: Self-pay | Admitting: Orthopedic Surgery

## 2019-02-18 ENCOUNTER — Encounter: Payer: Self-pay | Admitting: *Deleted

## 2019-02-18 ENCOUNTER — Other Ambulatory Visit: Payer: Self-pay

## 2019-02-18 ENCOUNTER — Encounter
Admission: RE | Admit: 2019-02-18 | Discharge: 2019-02-18 | Disposition: A | Discharge status: Home / Self Care | Payer: Managed Care, Other (non HMO) | Source: Ambulatory Visit | Attending: Orthopedic Surgery | Admitting: Orthopedic Surgery

## 2019-02-18 NOTE — Patient Instructions (Signed)
Your procedure is scheduled on: 02/24/2019 Thurs Report to Same Day Surgery 2nd floor medical mall [facility] Entrance-take elevator on left to 2nd floor.  Check in with surgery information desk.) To find out your arrival time please call [phone] between 1PM - 3PM on 02/23/2019 Wed  Remember: Instructions that are not followed completely may result in serious medical risk, up to and including death, or upon the discretion of your surgeon and anesthesiologist your surgery may need to be rescheduled.    _x___ 1. Do not eat food after midnight the night before your procedure. You may drink clear liquids up to 2 hours before you are scheduled to arrive at the hospital for your procedure.  Do not drink clear liquids within 2 hours of your scheduled arrival to the hospital.  Clear liquids include  --Water or Apple juice without pulp  --Clear carbohydrate beverage such as ClearFast or Gatorade  --Black Coffee or Clear Tea (No milk, no creamers, do not add anything to                  the coffee or Tea Type 1 and type 2 diabetics should only drink water.   ____Ensure clear carbohydrate drink on the way to the hospital for bariatric patients  ____Ensure clear carbohydrate drink 3 hours before surgery.   No gum chewing or hard candies.     __x__ 2. No Alcohol for 24 hours before or after surgery.   __x__3. No Smoking or e-cigarettes for 24 prior to surgery.  Do not use any chewable tobacco products for at least 6 hour prior to surgery   ____  4. Bring all medications with you on the day of surgery if instructed.    __x__ 5. Notify your doctor if there is any change in your medical condition     (cold, fever, infections).    x___6. On the morning of surgery brush your teeth with toothpaste and water.  You may rinse your mouth with mouth wash if you wish.  Do not swallow any toothpaste or mouthwash.   Do not wear jewelry, make-up, hairpins, clips or nail polish.  Do not wear lotions,  powders, or perfumes. You may wear deodorant.  Do not shave 48 hours prior to surgery. Men may shave face and neck.  Do not bring valuables to the hospital.    [facility] is not responsible for any belongings or valuables.               Contacts, dentures or bridgework may not be worn into surgery.  Leave your suitcase in the car. After surgery it may be brought to your room.  For patients admitted to the hospital, discharge time is determined by your                       treatment team.  _  Patients discharged the day of surgery will not be allowed to drive home.  You will need someone to drive you home and stay with you the night of your procedure.    Please read over the following fact sheets that you were given:   [facility] Preparing for Surgery and or MRSA Information   _x___ Take anti-hypertensive listed below, cardiac, seizure, asthma,     anti-reflux and psychiatric medicines. These include:  1. DULoxetine (CYMBALTA) 60 MG capsule  2.traMADol (ULTRAM) 50 MG tablet if needed  3.  4.  5.  6.  ____Fleets enema or Magnesium  Citrate as directed.   _x___ Use CHG Soap or sage wipes as directed on instruction sheet   ____ Use inhalers on the day of surgery and bring to hospital day of surgery  ____ Stop Metformin and Janumet 2 days prior to surgery.    ____ Take 1/2 of usual insulin dose the night before surgery and none on the morning     surgery.   _x___ Follow recommendations from Cardiologist, Pulmonologist or PCP regarding          stopping Aspirin, Coumadin, Plavix ,Eliquis, Effient, or Pradaxa, and Pletal.  X____Stop Anti-inflammatories such as Advil, Aleve, Ibuprofen, Motrin, Naproxen, Naprosyn, Goodies powders or aspirin products. OK to take Tylenol and                          Celebrex.   _x___ Stop supplements until after surgery.  But may continue Vitamin D, Vitamin B,       and multivitamin.   ____ Bring C-Pap to the hospital.

## 2019-02-21 ENCOUNTER — Other Ambulatory Visit: Payer: Self-pay | Admitting: Family Medicine

## 2019-02-21 ENCOUNTER — Other Ambulatory Visit
Admission: RE | Admit: 2019-02-21 | Discharge: 2019-02-21 | Disposition: A | Discharge status: Home / Self Care | Payer: Managed Care, Other (non HMO) | Source: Ambulatory Visit | Attending: Orthopedic Surgery | Admitting: Orthopedic Surgery

## 2019-02-21 ENCOUNTER — Other Ambulatory Visit: Payer: Self-pay

## 2019-02-21 DIAGNOSIS — Z20828 Contact with and (suspected) exposure to other viral communicable diseases: Secondary | ICD-10-CM | POA: Insufficient documentation

## 2019-02-21 DIAGNOSIS — G4700 Insomnia, unspecified: Secondary | ICD-10-CM

## 2019-02-21 DIAGNOSIS — Z01812 Encounter for preprocedural laboratory examination: Secondary | ICD-10-CM | POA: Diagnosis present

## 2019-02-21 LAB — PROTIME-INR
INR: 1 (ref 0.8–1.2)
Prothrombin Time: 12.7 seconds (ref 11.4–15.2)

## 2019-02-21 LAB — BASIC METABOLIC PANEL
Anion gap: 4 — ABNORMAL LOW (ref 5–15)
BUN: 12 mg/dL (ref 6–20)
CO2: 29 mmol/L (ref 22–32)
Calcium: 9.1 mg/dL (ref 8.9–10.3)
Chloride: 107 mmol/L (ref 98–111)
Creatinine, Ser: 0.69 mg/dL (ref 0.44–1.00)
GFR calc Af Amer: >60 mL/min (ref >60)
GFR calc non Af Amer: >60 mL/min (ref >60)
Glucose, Bld: 104 mg/dL — ABNORMAL HIGH (ref 70–99)
Potassium: 4.2 mmol/L (ref 3.5–5.1)
Sodium: 140 mmol/L (ref 135–145)

## 2019-02-21 LAB — CBC WITH DIFFERENTIAL/PLATELET
Abs Immature Granulocytes: 0.01 10*3/uL (ref 0.00–0.07)
Basophils Absolute: 0 10*3/uL (ref 0.0–0.1)
Basophils Relative: 1 %
Eosinophils Absolute: 0.3 10*3/uL (ref 0.0–0.5)
Eosinophils Relative: 4 %
HCT: 38.3 % (ref 36.0–46.0)
Hemoglobin: 12.3 g/dL (ref 12.0–15.0)
Immature Granulocytes: 0 %
Lymphocytes Relative: 29 %
Lymphs Abs: 1.9 10*3/uL (ref 0.7–4.0)
MCH: 28.1 pg (ref 26.0–34.0)
MCHC: 32.1 g/dL (ref 30.0–36.0)
MCV: 87.6 fL (ref 80.0–100.0)
Monocytes Absolute: 0.4 10*3/uL (ref 0.1–1.0)
Monocytes Relative: 6 %
Neutro Abs: 4 10*3/uL (ref 1.7–7.7)
Neutrophils Relative %: 60 %
Platelets: 312 10*3/uL (ref 150–400)
RBC: 4.37 MIL/uL (ref 3.87–5.11)
RDW: 13.6 % (ref 11.5–15.5)
WBC: 6.7 10*3/uL (ref 4.0–10.5)
nRBC: 0 % (ref 0.0–0.2)

## 2019-02-21 LAB — APTT: aPTT: 33 seconds (ref 24–36)

## 2019-02-21 LAB — SARS CORONAVIRUS 2 (TAT 6-24 HRS): SARS Coronavirus 2: NEGATIVE

## 2019-02-21 MED ORDER — ESZOPICLONE 2 MG PO TABS: 2.0000 mg | ORAL_TABLET | Freq: Every day | ORAL | 0 refills | Status: DC

## 2019-02-21 NOTE — Telephone Encounter (Signed)
Pt called and is requesting to have her lunesta sent in early since she will be having surgery on Thursday morning. Please advise.   [id number] Candice Guzman, [address] AT [address]  [place] [address]  Phone: [phone] Fax: [phone]  Not a 24 hour pharmacy; exact hours not known.

## 2019-02-22 MED ORDER — ESZOPICLONE 2 MG PO TABS: 2.0000 mg | ORAL_TABLET | Freq: Every day | ORAL | 0 refills | Status: DC

## 2019-02-24 ENCOUNTER — Encounter: Payer: Self-pay | Admitting: Anesthesiology

## 2019-02-24 ENCOUNTER — Other Ambulatory Visit: Payer: Self-pay

## 2019-02-24 ENCOUNTER — Ambulatory Visit: Payer: Managed Care, Other (non HMO) | Admitting: Anesthesiology

## 2019-02-24 ENCOUNTER — Ambulatory Visit: Payer: Managed Care, Other (non HMO)

## 2019-02-24 ENCOUNTER — Ambulatory Visit
Admission: RE | Admit: 2019-02-24 | Discharge: 2019-02-24 | Disposition: A | Discharge status: Home / Self Care | Payer: Managed Care, Other (non HMO) | Attending: Orthopedic Surgery | Admitting: Orthopedic Surgery

## 2019-02-24 ENCOUNTER — Encounter
Admission: RE | Disposition: A | Discharge status: Home / Self Care | Payer: Self-pay | Source: Home / Self Care | Attending: Orthopedic Surgery

## 2019-02-24 DIAGNOSIS — Z885 Allergy status to narcotic agent status: Secondary | ICD-10-CM | POA: Insufficient documentation

## 2019-02-24 DIAGNOSIS — G709 Myoneural disorder, unspecified: Secondary | ICD-10-CM | POA: Diagnosis not present

## 2019-02-24 DIAGNOSIS — M19011 Primary osteoarthritis, right shoulder: Secondary | ICD-10-CM | POA: Diagnosis not present

## 2019-02-24 DIAGNOSIS — Z79899 Other long term (current) drug therapy: Secondary | ICD-10-CM | POA: Diagnosis not present

## 2019-02-24 DIAGNOSIS — M5136 Other intervertebral disc degeneration, lumbar region: Secondary | ICD-10-CM | POA: Insufficient documentation

## 2019-02-24 DIAGNOSIS — M719 Bursopathy, unspecified: Secondary | ICD-10-CM | POA: Insufficient documentation

## 2019-02-24 DIAGNOSIS — M069 Rheumatoid arthritis, unspecified: Secondary | ICD-10-CM | POA: Diagnosis not present

## 2019-02-24 DIAGNOSIS — Z801 Family history of malignant neoplasm of trachea, bronchus and lung: Secondary | ICD-10-CM | POA: Diagnosis not present

## 2019-02-24 DIAGNOSIS — M7501 Adhesive capsulitis of right shoulder: Secondary | ICD-10-CM | POA: Diagnosis not present

## 2019-02-24 DIAGNOSIS — M659 Synovitis and tenosynovitis, unspecified: Secondary | ICD-10-CM | POA: Diagnosis not present

## 2019-02-24 DIAGNOSIS — E559 Vitamin D deficiency, unspecified: Secondary | ICD-10-CM | POA: Insufficient documentation

## 2019-02-24 DIAGNOSIS — M1990 Unspecified osteoarthritis, unspecified site: Secondary | ICD-10-CM | POA: Diagnosis not present

## 2019-02-24 DIAGNOSIS — G4700 Insomnia, unspecified: Secondary | ICD-10-CM | POA: Diagnosis not present

## 2019-02-24 DIAGNOSIS — Z825 Family history of asthma and other chronic lower respiratory diseases: Secondary | ICD-10-CM | POA: Diagnosis not present

## 2019-02-24 DIAGNOSIS — M75121 Complete rotator cuff tear or rupture of right shoulder, not specified as traumatic: Secondary | ICD-10-CM | POA: Diagnosis present

## 2019-02-24 DIAGNOSIS — M7541 Impingement syndrome of right shoulder: Secondary | ICD-10-CM | POA: Diagnosis not present

## 2019-02-24 DIAGNOSIS — Z800 Family history of malignant neoplasm of digestive organs: Secondary | ICD-10-CM | POA: Diagnosis not present

## 2019-02-24 DIAGNOSIS — F329 Major depressive disorder, single episode, unspecified: Secondary | ICD-10-CM | POA: Diagnosis not present

## 2019-02-24 DIAGNOSIS — Z791 Long term (current) use of non-steroidal anti-inflammatories (NSAID): Secondary | ICD-10-CM | POA: Insufficient documentation

## 2019-02-24 HISTORY — DX: Pain in unspecified shoulder: M25.519

## 2019-02-24 HISTORY — PX: SHOULDER ARTHROSCOPY WITH ROTATOR CUFF REPAIR: SHX5685

## 2019-02-24 HISTORY — DX: Dyspnea, unspecified: R06.00

## 2019-02-24 SURGERY — ARTHROSCOPY, SHOULDER, WITH ROTATOR CUFF REPAIR
Anesthesia: General | Site: Shoulder | Laterality: Right

## 2019-02-24 MED ORDER — FENTANYL CITRATE (PF) 100 MCG/2ML IJ SOLN
INTRAMUSCULAR | Status: AC
  Administered 2019-02-24: 07:00:00 50 ug via INTRAVENOUS
  Filled 2019-02-24: qty 2

## 2019-02-24 MED ORDER — NEOMYCIN-POLYMYXIN B GU 40-200000 IR SOLN
Status: AC
  Filled 2019-02-24: qty 2

## 2019-02-24 MED ORDER — SEVOFLURANE IN SOLN
RESPIRATORY_TRACT | Status: AC
  Filled 2019-02-24: qty 250

## 2019-02-24 MED ORDER — PROPOFOL 10 MG/ML IV BOLUS
INTRAVENOUS | Status: AC
  Filled 2019-02-24: qty 40

## 2019-02-24 MED ORDER — BUPIVACAINE LIPOSOME 1.3 % IJ SUSP
INTRAMUSCULAR | Status: AC
  Filled 2019-02-24: qty 20

## 2019-02-24 MED ORDER — DEXAMETHASONE SODIUM PHOSPHATE 10 MG/ML IJ SOLN
INTRAMUSCULAR | Status: DC | PRN
  Administered 2019-02-24 (×2): 10 mg via INTRAVENOUS

## 2019-02-24 MED ORDER — CEFAZOLIN SODIUM-DEXTROSE 2-4 GM/100ML-% IV SOLN
2.0000 g | INTRAVENOUS | Status: AC
  Administered 2019-02-24: 2 g via INTRAVENOUS

## 2019-02-24 MED ORDER — SUGAMMADEX SODIUM 200 MG/2ML IV SOLN
INTRAVENOUS | Status: AC
  Filled 2019-02-24: qty 2

## 2019-02-24 MED ORDER — PHENYLEPHRINE HCL (PRESSORS) 10 MG/ML IV SOLN
INTRAVENOUS | Status: DC | PRN
  Administered 2019-02-24: 50 ug via INTRAVENOUS

## 2019-02-24 MED ORDER — MIDAZOLAM HCL 2 MG/2ML IJ SOLN
1.0000 mg | Freq: Once | INTRAMUSCULAR | Status: AC
  Administered 2019-02-24: 07:00:00 1 mg via INTRAVENOUS

## 2019-02-24 MED ORDER — CEFAZOLIN SODIUM-DEXTROSE 2-4 GM/100ML-% IV SOLN
INTRAVENOUS | Status: AC
  Filled 2019-02-24: qty 100

## 2019-02-24 MED ORDER — GLYCOPYRROLATE 0.2 MG/ML IJ SOLN
INTRAMUSCULAR | Status: DC | PRN
  Administered 2019-02-24 (×2): 0.2 mg via INTRAVENOUS

## 2019-02-24 MED ORDER — NEOMYCIN-POLYMYXIN B GU 40-200000 IR SOLN
Status: DC | PRN
  Administered 2019-02-24: 2 mL

## 2019-02-24 MED ORDER — CHLORHEXIDINE GLUCONATE CLOTH 2 % EX PADS: 6.0000 | MEDICATED_PAD | Freq: Once | CUTANEOUS | Status: DC

## 2019-02-24 MED ORDER — OXYCODONE HCL 5 MG PO TABS: 5.0000 mg | ORAL_TABLET | Freq: Once | ORAL | Status: DC | PRN

## 2019-02-24 MED ORDER — MIDAZOLAM HCL 2 MG/2ML IJ SOLN
INTRAMUSCULAR | Status: AC
  Filled 2019-02-24: qty 2

## 2019-02-24 MED ORDER — FENTANYL CITRATE (PF) 100 MCG/2ML IJ SOLN: 25.0000 ug | INTRAMUSCULAR | Status: DC | PRN

## 2019-02-24 MED ORDER — FAMOTIDINE 20 MG PO TABS
ORAL_TABLET | ORAL | Status: AC
  Administered 2019-02-24: 07:00:00 20 mg via ORAL
  Filled 2019-02-24: qty 1

## 2019-02-24 MED ORDER — PROPOFOL 10 MG/ML IV BOLUS
INTRAVENOUS | Status: DC | PRN
  Administered 2019-02-24: 120 mg via INTRAVENOUS

## 2019-02-24 MED ORDER — ONDANSETRON HCL 4 MG/2ML IJ SOLN
INTRAMUSCULAR | Status: AC
  Filled 2019-02-24: qty 2

## 2019-02-24 MED ORDER — LIDOCAINE HCL (CARDIAC) PF 100 MG/5ML IV SOSY: PREFILLED_SYRINGE | INTRAVENOUS | Status: DC | PRN

## 2019-02-24 MED ORDER — SODIUM CHLORIDE 0.9 % IV SOLN
INTRAVENOUS | Status: DC | PRN
  Administered 2019-02-24: 10 ug/min via INTRAVENOUS

## 2019-02-24 MED ORDER — ONDANSETRON HCL 4 MG/2ML IJ SOLN
INTRAMUSCULAR | Status: DC | PRN
  Administered 2019-02-24 (×2): 4 mg via INTRAVENOUS

## 2019-02-24 MED ORDER — LACTATED RINGERS IV SOLN
INTRAVENOUS | Status: DC | PRN
  Administered 2019-02-24: 4 mL

## 2019-02-24 MED ORDER — BUPIVACAINE LIPOSOME 1.3 % IJ SUSP
INTRAMUSCULAR | Status: DC | PRN
  Administered 2019-02-24: 7 mL via PERINEURAL
  Administered 2019-02-24: 13 mL via PERINEURAL

## 2019-02-24 MED ORDER — MIDAZOLAM HCL 2 MG/2ML IJ SOLN
1.0000 mg | Freq: Once | INTRAMUSCULAR | Status: AC
  Administered 2019-02-24: 1 mg via INTRAVENOUS

## 2019-02-24 MED ORDER — FENTANYL CITRATE (PF) 100 MCG/2ML IJ SOLN
50.0000 ug | Freq: Once | INTRAMUSCULAR | Status: AC
  Administered 2019-02-24: 07:00:00 50 ug via INTRAVENOUS

## 2019-02-24 MED ORDER — MIDAZOLAM HCL 2 MG/2ML IJ SOLN
INTRAMUSCULAR | Status: AC
  Administered 2019-02-24: 07:00:00 1 mg via INTRAVENOUS
  Filled 2019-02-24: qty 2

## 2019-02-24 MED ORDER — ONDANSETRON HCL 4 MG PO TABS: 4.0000 mg | ORAL_TABLET | Freq: Three times a day (TID) | ORAL | 0 refills | Status: DC | PRN

## 2019-02-24 MED ORDER — FAMOTIDINE 20 MG PO TABS
20.0000 mg | ORAL_TABLET | Freq: Once | ORAL | Status: AC
  Administered 2019-02-24: 07:00:00 20 mg via ORAL

## 2019-02-24 MED ORDER — LACTATED RINGERS IV SOLN
INTRAVENOUS | Status: DC
  Administered 2019-02-24: 07:00:00 via INTRAVENOUS

## 2019-02-24 MED ORDER — BUPIVACAINE HCL (PF) 0.5 % IJ SOLN
INTRAMUSCULAR | Status: DC | PRN
  Administered 2019-02-24: 3 mL via PERINEURAL
  Administered 2019-02-24: 7 mL via PERINEURAL

## 2019-02-24 MED ORDER — LIDOCAINE HCL (PF) 1 % IJ SOLN
INTRAMUSCULAR | Status: AC
  Filled 2019-02-24: qty 5

## 2019-02-24 MED ORDER — EPINEPHRINE PF 1 MG/ML IJ SOLN
INTRAMUSCULAR | Status: AC
  Filled 2019-02-24: qty 4

## 2019-02-24 MED ORDER — BUPIVACAINE HCL (PF) 0.25 % IJ SOLN
INTRAMUSCULAR | Status: AC
  Filled 2019-02-24: qty 30

## 2019-02-24 MED ORDER — ROCURONIUM BROMIDE 100 MG/10ML IV SOLN
INTRAVENOUS | Status: DC | PRN
  Administered 2019-02-24: 50 mg via INTRAVENOUS

## 2019-02-24 MED ORDER — ACETAMINOPHEN 10 MG/ML IV SOLN
INTRAVENOUS | Status: AC
  Filled 2019-02-24: qty 100

## 2019-02-24 MED ORDER — OXYCODONE HCL 5 MG/5ML PO SOLN: 5.0000 mg | Freq: Once | ORAL | Status: DC | PRN

## 2019-02-24 MED ORDER — LIDOCAINE HCL (PF) 1 % IJ SOLN
INTRAMUSCULAR | Status: AC
  Filled 2019-02-24: qty 30

## 2019-02-24 MED ORDER — ACETAMINOPHEN 10 MG/ML IV SOLN
INTRAVENOUS | Status: DC | PRN
  Administered 2019-02-24: 1000 mg via INTRAVENOUS

## 2019-02-24 MED ORDER — BUPIVACAINE HCL (PF) 0.5 % IJ SOLN
INTRAMUSCULAR | Status: AC
  Filled 2019-02-24: qty 10

## 2019-02-24 MED ORDER — FENTANYL CITRATE (PF) 100 MCG/2ML IJ SOLN
INTRAMUSCULAR | Status: AC
  Filled 2019-02-24: qty 2

## 2019-02-24 MED ORDER — OXYCODONE HCL 5 MG PO TABS: 5.0000 mg | ORAL_TABLET | ORAL | 0 refills | Status: DC | PRN

## 2019-02-24 SURGICAL SUPPLY — 79 items
ADAPTER IRRIG TUBE 2 SPIKE SOL (ADAPTER) ×6 IMPLANT
ADPR TBG 2 SPK PMP STRL ASCP (ADAPTER) ×2
ANCH SUT 5.5 KNTLS PEEK (Orthopedic Implant) ×2 IMPLANT
ANCH SUT Q-FX 2.8 (Anchor) ×2 IMPLANT
ANCHOR ALL-SUT Q-FIX 2.8 (Anchor) ×4 IMPLANT
ANCHOR SUT 5.5 MULTIFIX (Orthopedic Implant) ×2 IMPLANT
ANCHOR SUT 5.5MM MULTIFIX (Orthopedic Implant) ×2 IMPLANT
BUR RADIUS 4.0X18.5 (BURR) ×3 IMPLANT
BUR RADIUS 5.5 (BURR) ×3 IMPLANT
CANNULA 5.75X7 CRYSTAL CLEAR (CANNULA) ×6 IMPLANT
CANNULA PARTIAL THREAD 2X7 (CANNULA) ×3 IMPLANT
CANNULA TWIST IN 8.25X9CM (CANNULA) IMPLANT
CLOSURE WOUND 1/2 X4 (GAUZE/BANDAGES/DRESSINGS) ×2
CONNECTOR PERFECT PASSER (CONNECTOR) ×2 IMPLANT
COOLER POLAR GLACIER W/PUMP (MISCELLANEOUS) ×3 IMPLANT
COVER WAND RF STERILE (DRAPES) ×3 IMPLANT
CRADLE LAMINECT ARM (MISCELLANEOUS) ×3 IMPLANT
DEVICE SUCT BLK HOLE OR FLOOR (MISCELLANEOUS) IMPLANT
DRAPE 3/4 80X56 (DRAPES) ×3 IMPLANT
DRAPE IMP U-DRAPE 54X76 (DRAPES) ×2 IMPLANT
DRAPE INCISE IOBAN 66X45 STRL (DRAPES) ×3 IMPLANT
DRAPE SPLIT 6X30 W/TAPE (DRAPES) ×6 IMPLANT
DRAPE U-SHAPE 47X51 STRL (DRAPES) IMPLANT
DURAPREP 26ML APPLICATOR (WOUND CARE) ×9 IMPLANT
ELECT REM PT RETURN 9FT ADLT (ELECTROSURGICAL) ×3
ELECTRODE REM PT RTRN 9FT ADLT (ELECTROSURGICAL) ×1 IMPLANT
FIRSTPASS ST SUTURE PASSER SELF CAPTURE ×1 IMPLANT
GAUZE SPONGE 4X4 12PLY STRL (GAUZE/BANDAGES/DRESSINGS) ×6 IMPLANT
GAUZE XEROFORM 1X8 LF (GAUZE/BANDAGES/DRESSINGS) ×3 IMPLANT
GLOVE BIOGEL PI IND STRL 9 (GLOVE) ×1 IMPLANT
GLOVE BIOGEL PI INDICATOR 9 (GLOVE) ×2
GLOVE SURG 9.0 ORTHO LTXF (GLOVE) ×6 IMPLANT
GOWN STRL REUS TWL 2XL XL LVL4 (GOWN DISPOSABLE) ×3 IMPLANT
GOWN STRL REUS W/ TWL LRG LVL3 (GOWN DISPOSABLE) ×1 IMPLANT
GOWN STRL REUS W/ TWL LRG LVL4 (GOWN DISPOSABLE) ×1 IMPLANT
GOWN STRL REUS W/TWL LRG LVL3 (GOWN DISPOSABLE) ×3
GOWN STRL REUS W/TWL LRG LVL4 (GOWN DISPOSABLE) ×3
IV LACTATED RINGER IRRG 3000ML (IV SOLUTION) ×54
IV LR IRRIG 3000ML ARTHROMATIC (IV SOLUTION) ×6 IMPLANT
KIT STABILIZATION SHOULDER (MISCELLANEOUS) ×3 IMPLANT
KIT SUTURE 2.8 Q-FIX DISP (MISCELLANEOUS) ×2 IMPLANT
KIT SUTURETAK 3.0 INSERT PERC (KITS) IMPLANT
KIT TURNOVER KIT A (KITS) ×3 IMPLANT
MANIFOLD NEPTUNE II (INSTRUMENTS) ×3 IMPLANT
MASK FACE SPIDER DISP (MASK) ×3 IMPLANT
MAT ABSORB  FLUID 56X50 GRAY (MISCELLANEOUS) ×4
MAT ABSORB FLUID 56X50 GRAY (MISCELLANEOUS) ×2 IMPLANT
NDL SAFETY ECLIPSE 18X1.5 (NEEDLE) ×1 IMPLANT
NEEDLE HYPO 18GX1.5 SHARP (NEEDLE) ×3
NEEDLE HYPO 22GX1.5 SAFETY (NEEDLE) ×3 IMPLANT
NS IRRIG 500ML POUR BTL (IV SOLUTION) ×3 IMPLANT
PACK ARTHROSCOPY SHOULDER (MISCELLANEOUS) ×3 IMPLANT
PAD WRAPON POLAR SHDR XLG (MISCELLANEOUS) ×1 IMPLANT
PASSER SUT CAPTURE FIRST (SUTURE) IMPLANT
PASSER SUT FIRSTPASS SELF (INSTRUMENTS) ×2 IMPLANT
SET TUBE SUCT SHAVER OUTFL 24K (TUBING) ×3 IMPLANT
SET TUBE TIP INTRA-ARTICULAR (MISCELLANEOUS) ×3 IMPLANT
STRAP SAFETY 5IN WIDE (MISCELLANEOUS) ×3 IMPLANT
STRIP CLOSURE SKIN 1/2X4 (GAUZE/BANDAGES/DRESSINGS) ×4 IMPLANT
SUT ETHILON 4-0 (SUTURE) ×3
SUT ETHILON 4-0 FS2 18XMFL BLK (SUTURE) ×1
SUT LASSO 90 DEG SD STR (SUTURE) IMPLANT
SUT MNCRL 4-0 (SUTURE) ×3
SUT MNCRL 4-0 27XMFL (SUTURE) ×1
SUT PDS AB 0 CT1 27 (SUTURE) ×5 IMPLANT
SUT PERFECTPASSER WHITE CART (SUTURE) ×4 IMPLANT
SUT SMART STITCH CARTRIDGE (SUTURE) ×10 IMPLANT
SUT VIC AB 0 CT1 36 (SUTURE) ×3 IMPLANT
SUT VIC AB 2-0 CT2 27 (SUTURE) ×3 IMPLANT
SUTURE ETHLN 4-0 FS2 18XMF BLK (SUTURE) ×1 IMPLANT
SUTURE MAGNUM WIRE 2X48 BLK (SUTURE) IMPLANT
SUTURE MNCRL 4-0 27XMF (SUTURE) ×1 IMPLANT
SYR 10ML LL (SYRINGE) ×3 IMPLANT
TAPE MICROFOAM 4IN (TAPE) ×3 IMPLANT
TUBING ARTHRO INFLOW-ONLY STRL (TUBING) ×3 IMPLANT
TUBING CONNECTING 10 (TUBING) ×2 IMPLANT
TUBING CONNECTING 10' (TUBING) ×1
WAND HAND CNTRL MULTIVAC 90 (MISCELLANEOUS) ×3 IMPLANT
WRAPON POLAR PAD SHDR XLG (MISCELLANEOUS) ×3

## 2019-02-24 NOTE — Discharge Instructions (Addendum)
AMBULATORY SURGERY  °DISCHARGE INSTRUCTIONS ° ° °1) The drugs that you were given will stay in your system until tomorrow so for the next 24 hours you should not: ° °A) Drive an automobile °B) Make any legal decisions °C) Drink any alcoholic beverage ° ° °2) You may resume regular meals tomorrow.  Today it is better to start with liquids and gradually work up to solid foods. ° °You may eat anything you prefer, but it is better to start with liquids, then soup and crackers, and gradually work up to solid foods. ° ° °3) Please notify your doctor immediately if you have any unusual bleeding, trouble breathing, redness and pain at the surgery site, drainage, fever, or pain not relieved by medication. ° ° ° °4) Additional Instructions: ° ° ° ° ° ° ° °Please contact your physician with any problems or Same Day Surgery at [phone], Monday through Friday 6 am to 4 pm, or [place] [facility] number at [phone]. ° ° ° ° °Interscalene Nerve Block, Care After °This sheet gives you information about how to care for yourself after your procedure. Your health care provider may also give you more specific instructions. If you have problems or questions, contact your health care provider. °What can I expect after the procedure? °After the procedure, it is common to have: °· Soreness or tenderness in your neck. °· Numbness in your shoulder, upper arm, and some fingers. °· Weakness in your shoulder and arm muscles. °The feeling and strength in your shoulder, arm, and fingers should return to normal within hours after your procedure. °Follow these instructions at home: °For at least 24 hours after the procedure: °· Do not: °? Participate in activities in which you could fall or become injured. °? Drive. °? Use heavy machinery. °? Drink alcohol. °? Take sleeping pills or medicines that cause drowsiness. °? Make important decisions or sign legal documents. °? Take care of children on your own. °· Rest. °Eating and  drinking °· If you vomit, drink water, juice, or soup when you can drink without vomiting. °· Make sure you have little or no nausea before eating solid foods. °· Follow the diet that is recommended by your health care provider. °If you have a sling: °· Wear it as told by your health care provider. Remove it only as told by your health care provider. °· Loosen the sling if your fingers tingle, become numb, or turn cold and blue. °· Make sure that your entire arm, including your wrist, is supported. Do not allow your wrist to dangle over the end of the sling. °· Do not let your sling get wet if it is not waterproof. °· Keep the sling clean. °Bathing °· Do not take baths, swim, or use a hot tub until your health care provider approves. °· If you have a nerve block catheter in place, keep the incision site and tubing dry. °Injection site care ° °· Wash your hands with soap and water before you change your bandage (dressing). If soap and water are not available, use hand sanitizer. °· Change your dressing as told by your health care provider. °· Keep your dressing dry. °· Check your nerve block injection site every day for signs of infection. Check for: °? Redness, swelling, or pain. °? Fluid or blood. °? Warmth. °Activity °· Do not perform complex or risky activities while taking prescription pain medicine and until you have fully recovered. °· Return to your normal activities as told by your   health care provider and as you can tolerate them. Ask your health care provider what activities are safe for you. °· Rest and take it easy. This will help you heal and recover more quickly and fully. °· Be very cautious until you have regained strength and sensation. °General instructions °· Have a responsible adult stay with you until you are awake and alert. °· Do not drive or use heavy machinery while taking prescription pain medicine and until you have fully recovered. Ask your health care provider when it is safe to  drive. °· Take over-the-counter and prescription medicines only as told by your health care provider. °· If you smoke, do not smoke without supervision. °· Do not expose your arm or shoulder to very cold or very hot temperatures until you have full feeling back. °· If you have a nerve block catheter in place: °? Try to keep the catheter from getting kinked or pinched. °? Avoid pulling or tugging on the catheter. °· Keep all follow-up visits as told by your health care provider. This is important. °Contact a health care provider if: °· You have chills or fever. °· You have redness, swelling, or pain around your injection site. °· You have fluid or blood coming from the injection site. °· The skin around the injection site is warm to the touch. °· There is a bad smell coming from your dressing. °· You have hoarseness or a drooping or dry eye that lasts more than a few days. °· You have pain that is poorly controlled with the block or with pain medicine. °· You have numbness, tingling, or weakness in your shoulder or arm that lasts for more than one week. °Get help right away if: °· You have severe pain. °· You lose or do not regain strength and sensation in your arm even after the nerve block medicine has stopped. °· You have trouble breathing. °· You have a nerve block catheter still in place and you begin to shiver. °· You have a nerve block catheter still in place and you are getting more and more numb or weak. °This information is not intended to replace advice given to you by your health care provider. Make sure you discuss any questions you have with your health care provider. °Document Released: 06/15/2015 Document Revised: 06/26/2017 Document Reviewed: 02/22/2016 °Elsevier Patient Education © 2020 [employer] ° °

## 2019-02-24 NOTE — Op Note (Signed)
02/24/2019  12:36 PM  PATIENT:  Candice Guzman  58 y.o. female  PRE-OPERATIVE DIAGNOSIS:  M75.121 complete rotator cuff tear or rupture right shoulder  POST-OPERATIVE DIAGNOSIS:   1.  High-grade partial-thickness tear of the supraspinatus 2.  Adhesive capsulitis with significant thickening of the anterior capsule. 3.  Extensive synovitis of the glenohumeral joint 4.  Extensive subacromial bursitis 5.  Subacromial impingement 6.  Acromioclavicular joint arthrosis  PROCEDURE:  Procedure(s): 1.  RIGHT SHOULDER ARTHROSCOPIC LYSIS OF ADHESIONS AND ANTERIOR CAPSULOTOMY 2. DEBRIDEMENT OF GLENOHUMERAL JOINT SYNOVITIS 3. ARTHROSCOPIC SUBACROMIAL DECOMPRESSION AND DEBRIDEMENT 4. DISTAL CLAVICLE EXCISION, 5. MINI OPEN ROTATOR CUFF REPAIR   SURGEON:  Surgeon(s) and Role:    Thornton Park, MD - Primary  ANESTHESIA:   general and paracervical block   PREOPERATIVE INDICATIONS:  Candice Guzman is a  58 y.o. female with a diagnosis of M75.121 complete rotator cuff tear or rupture right shoulder failed conservative treatment and elected for surgical management.    The risks benefits and alternatives were discussed with the patient preoperatively including but not limited to the risks of infection, bleeding, nerve injury, persistent pain or weakness, failure of the hardware, re-tear of the rotator cuff and the need for further surgery. Medical risks include DVT and pulmonary embolism, myocardial infarction, stroke, pneumonia, respiratory failure and death. Patient understood these risks and wished to proceed.  OPERATIVE IMPLANTS: [place] Multifix anchors x 2 & [employer] Q Fix anchors x 2  OPERATIVE PROCEDURE: The patient was met in the preoperative area. The right shoulder was signed with the word yes and my initials according the hospital's correct site of surgery protocol.   History and physical was updated.   Patient underwent an interscalene block with Exparel by the  anesthesia service.  Patient was brought to the operating room where she underwent general endotracheal intubation.  The patient was placed in a beachchair position.  A spider arm positioner was used for this case.   Examination under anesthesia significant limitation of motion including forward elevation of approximately 80 to 90 degrees, abduction of approximately 70 to 80 degrees and external rotation in abduction of approximately 60 to 70 degrees.  External rotation with the arm abducted was approximately 45 degrees.  Patient demonstrated no instability with load shift testing. The patient had a negative sulcus sign.  Patient was prepped and draped in a sterile fashion. A timeout was performed to verify the patient's name, date of birth, medical record number, correct site of surgery and correct procedure to be performed there was also used to verify the patient received antibiotics that all appropriate instruments, implants and radiographs studies were available in the room. Once all in attendance were in agreement case began.  Bony landmarks were drawn out with a surgical marker along with proposed arthroscopy incisions. An 11 blade was used to establish a posterior portal through which the arthroscope was placed in the glenohumeral joint. A full diagnostic examination of the shoulder was performed.  Patient was found to have extensive synovitis in the glenohumeral joint which was debrided using a 90 degree ArthroCare wand.  Patient also had adhesive capsulitis with extensive thickening of the anterior capsule.  This was debrided again using a 90 degree ArthroCare wand and 4.0 mm resector shaver blade.  The subscapularis was encased in the scar and was released from all adhesions.  The subscapularis itself is not torn.  The biceps tendon was also not torn although the superior and anterior  labrum were frayed and debrided with a 4.0 mm resector shaver blade.  There is no evidence of a SLAP tear.  There  were no focal chondral defects of the glenohumeral joint.  There are no loose bodies or hilar lesions seen with examination of the inferior recess.  Patient had a high-grade partial-thickness tear involving the supraspinatus. An 18-gauge spinal needle was used to place a 0 PDS suture through the rotator cuff tear for identification from the bursal side.  The arthroscope was then placed in the subacromial space. Extensive bursitis was encountered in the subacromial space and debrided using a 4-0 resector shaver blade and a 90 ArthroCare wand from a lateral portal which was established under direct visualization using an 18-gauge spinal needle. A subacromial decompression was also performed using a 5.5 mm resector shaver blade from the lateral portal and a distal clavicle excision was performed using the same shaver through the anterior portal.   The 0 PDS suture marking the supraspinatus was identified.   The partial-thickness tear was then completed with a 4.0 resector shaver blade.  A Perfect Pass suture was placed in the lateral border of the rotator cuff tear. All arthroscopic instruments were then removed and the mini-open portion of the procedure began.  A saber-type incision was made along the lateral border of the acromion. The deltoid muscle was identified and split in line with its fibers which allowed visualization of the rotator cuff. The Perfect Pass suture previously placed in the lateral border of the rotator cuff were brought out through the deltoid split.  A 5.5 mm resector shaver blade was then used to debride the greater tuberosity of all torn fibers of the rotator cuff.  Two additional perfect Pass sutures were placed in the lateral border of the rotator cuff for a total of 3 sutures.  Two [employer] Fix anchors were placed at the articular margin of the humeral head with the greater tuberosity. The suture limbs of the Q Fix anchors were passed medially through the rotator cuff  using a First Pass suture passer.   The three Perfect Pass sutures were then anchored to the greater tuberosity footprint using two Multifix anchors. These anchors were tensioned to allow for anatomic reduction of the rotator cuff to the greater tuberosity. The medial row sutures were then tied down using an arthroscopic knot tying technique.  Arthroscopic images of the repair were taken with the arthroscope both externally and from inside the glenohumeral joint.  All incisions were copiously irrigated. The deltoid fascia was repaired using a 0 Vicryl suture.  The subcutaneous tissue of all incisions were closed with a 2-0 Vicryl. Skin closure for the arthroscopic incisions was performed with 4-0 nylon. The skin edges of the saber incision was approximated with a running 4-0 undyed Monocryl.  A dry sterile dressing was applied.  The patient was placed in an abduction sling and a Polar Care was applied to the shoulder.  All sharp and it instrument counts were correct at the conclusion of the case. I was scrubbed and present for the entire case. I spoke with the patient's son postoperatively to let him know the case had been performed without complication and the patient was stable in recovery room.

## 2019-02-24 NOTE — Anesthesia Procedure Notes (Signed)
Anesthesia [facility] Block: Interscalene brachial plexus block   Pre-Anesthetic Checklist: ,, timeout performed, Correct Patient, Correct Site, Correct Laterality, Correct Procedure, Correct Position, site marked, Risks and benefits discussed,  Surgical consent,  Pre-op evaluation,  At surgeon's request and post-op pain management  Laterality: Upper and Right  Prep: chloraprep       Needles:  Injection technique: Single-shot  Needle Type: Stimiplex     Needle Length: 5cm  Needle Gauge: 22     Additional Needles:   Procedures:,,,, ultrasound used (permanent image in chart),,,,  Narrative:  Start time: 02/24/2019 7:12 AM End time: 02/24/2019 7:16 AM Injection made incrementally with aspirations every 5 mL.  Performed by: Personally  Anesthesiologist: Lunden Stieber, Precious Haws, MD  Additional Notes: Patient consented for risk and benefits of nerve block including but not limited to nerve damage, failed block, bleeding and infection.  Patient voiced understanding.  Functioning IV was confirmed and monitors were applied.  A 42mm 22ga Stimuplex needle was used. Sterile prep,hand hygiene and sterile gloves were used.  Minimal sedation used for procedure.  No paresthesia endorsed by patient during the procedure.  Negative aspiration and negative test dose prior to incremental administration of local anesthetic. The patient tolerated the procedure well with no immediate complications.

## 2019-02-24 NOTE — Anesthesia Preprocedure Evaluation (Signed)
Anesthesia Evaluation  Patient identified by MRN, date of birth, ID band Patient awake    Reviewed: Allergy & Precautions, H&P , NPO status , Patient's Chart, lab work & pertinent test results  History of Anesthesia Complications Negative for: history of anesthetic complications  Airway Mallampati: III  TM Distance: >3 FB Neck ROM: full    Dental  (+) Chipped   Pulmonary neg pulmonary ROS, neg shortness of breath,           Cardiovascular Exercise Tolerance: Good (-) angina(-) Past MI and (-) DOE negative cardio ROS       Neuro/Psych PSYCHIATRIC DISORDERS  Neuromuscular disease    GI/Hepatic negative GI ROS, Neg liver ROS, neg GERD  ,  Endo/Other  negative endocrine ROS  Renal/GU      Musculoskeletal  (+) Arthritis ,   Abdominal   Peds  Hematology negative hematology ROS (+)   Anesthesia Other Findings Past Medical History: No date: Depression No date: Dyspnea No date: Insomnia No date: Lumbar herniated disc No date: Menopause No date: Rheumatoid arthritis (HCC) No date: Shoulder pain No date: Vitamin D deficiency  Past Surgical History: 2010: BREAST BIOPSY; Left     Comment:  neg No date: CESAREAN SECTION 07/24/2017: FINGER ARTHROPLASTY; Left     Comment:  Thumb No date: GANGLION CYST EXCISION 06/06/2016: METACARPOPHALANGEAL JOINT ARTHROPLASTY; Right     Comment:  Dr. Astrid Divine   BMI    Body Mass Index: 22.31 kg/m      Reproductive/Obstetrics negative OB ROS                             Anesthesia Physical Anesthesia Plan  ASA: II  Anesthesia Plan: General ETT   Post-op Pain Management: GA combined w/ Regional for post-op pain   Induction: Intravenous  PONV Risk Score and Plan: Ondansetron, Dexamethasone, Midazolam and Treatment may vary due to age or medical condition  Airway Management Planned: Oral ETT  Additional Equipment:   Intra-op Plan:    Post-operative Plan: Extubation in OR  Informed Consent: I have reviewed the patients History and Physical, chart, labs and discussed the procedure including the risks, benefits and alternatives for the proposed anesthesia with the patient or authorized representative who has indicated his/her understanding and acceptance.     Dental Advisory Given  Plan Discussed with: Anesthesiologist, CRNA and Surgeon  Anesthesia Plan Comments: (Patient consented for risks of anesthesia including but not limited to:  - adverse reactions to medications - damage to teeth, lips or other oral mucosa - sore throat or hoarseness - Damage to heart, brain, lungs or loss of life  Patient voiced understanding.)        Anesthesia Quick Evaluation

## 2019-02-24 NOTE — Anesthesia Procedure Notes (Signed)
Procedure Name: Intubation Performed by: Fletcher-Harrison, Greyson Riccardi, CRNA Pre-anesthesia Checklist: Patient identified, Emergency Drugs available, Suction available and Patient being monitored Patient Re-evaluated:Patient Re-evaluated prior to induction Oxygen Delivery Method: Circle system utilized Preoxygenation: Pre-oxygenation with 100% oxygen Induction Type: IV induction Ventilation: Mask ventilation without difficulty Laryngoscope Size: McGraph and 3 Grade View: Grade I Tube type: Oral Tube size: 6.5 mm Number of attempts: 1 Airway Equipment and Method: Stylet Placement Confirmation: ETT inserted through vocal cords under direct vision,  positive ETCO2,  CO2 detector and breath sounds checked- equal and bilateral Secured at: 22 cm Tube secured with: Tape Dental Injury: Teeth and Oropharynx as per pre-operative assessment        

## 2019-02-24 NOTE — Anesthesia Post-op Follow-up Note (Signed)
Anesthesia QCDR form completed.        

## 2019-02-24 NOTE — H&P (Signed)
PREOPERATIVE H&P  Chief Complaint: M75.121 complete rotator cuff tear or rupture right shoulder  HPI: Candice Guzman is a 58 y.o. female who presents for preoperative history and physical with a diagnosis of M75.121 complete rotator cuff tear or rupture right shoulder. Symptoms of pain, weakness and limited range of motion are significantly impairing activities of daily living.  MRI has confirmed a full-thickness tear involving the supraspinatus.  She has failed nonoperative management. She has agreed with plan for surgical repair.   Past Medical History:  Diagnosis Date  . Depression   . Dyspnea   . Insomnia   . Lumbar herniated disc   . Menopause   . Rheumatoid arthritis (HCC)   . Shoulder pain   . Vitamin D deficiency    Past Surgical History:  Procedure Laterality Date  . BREAST BIOPSY Left 2010   neg  . CESAREAN SECTION    . FINGER ARTHROPLASTY Left 07/24/2017   Thumb  . GANGLION CYST EXCISION    . METACARPOPHALANGEAL JOINT ARTHROPLASTY Right 06/06/2016   Dr. Dayna Barker    Social History   Socioeconomic History  . Marital status: Divorced    Spouse name: Not on file  . Number of children: 1  . Years of education: Not on file  . Highest education level: High school graduate  Occupational History  . Not on file  Social Needs  . Financial resource strain: Somewhat hard  . Food insecurity    Worry: Never true    Inability: Never true  . Transportation needs    Medical: No    Non-medical: No  Tobacco Use  . Smoking status: Never Smoker  . Smokeless tobacco: Never Used  Substance and Sexual Activity  . Alcohol use: No    Alcohol/week: 0.0 standard drinks  . Drug use: No  . Sexual activity: Yes    Partners: Male  Lifestyle  . Physical activity    Days per week: 0 days    Minutes per session: 0 min  . Stress: Very much  Relationships  . Social Musician on phone: More than three times a week    Gets together: Never    Attends religious  service: Never    Active member of club or organization: No    Attends meetings of clubs or organizations: Never    Relationship status: Divorced  Other Topics Concern  . Not on file  Social History Narrative  . Not on file   Family History  Problem Relation Age of Onset  . Emphysema Mother   . Cancer Father 60       lung  . Cancer Brother        tongue   Allergies  Allergen Reactions  . Codeine Itching  . Methotrexate Nausea Only   Prior to Admission medications   Medication Sig Start Date End Date Taking? Authorizing Provider  ALPRAZolam Prudy Feeler) 0.5 MG tablet Take 1 tablet (0.5 mg total) by mouth at bedtime as needed. Patient taking differently: Take 0.5 mg by mouth at bedtime as needed for anxiety or sleep.  11/26/18  Yes Sowles, Danna Hefty, MD  cloNIDine (CATAPRES) 0.1 MG tablet Take 1 tablet (0.1 mg total) by mouth 2 (two) times daily. 11/26/18  Yes Sowles, Danna Hefty, MD  DULoxetine (CYMBALTA) 60 MG capsule Take 1 capsule (60 mg total) by mouth daily. Patient taking differently: Take 60 mg by mouth 2 (two) times daily.  11/26/18  Yes Sowles, Danna Hefty, MD  eszopiclone (LUNESTA) 2 MG TABS  tablet Take 1 tablet (2 mg total) by mouth at bedtime. Take immediately before bedtime 02/22/19  Yes Sowles, Drue Stager, MD  HUMIRA PEN 40 MG/0.8ML PNKT Inject 40 mg into the skin every 14 (fourteen) days.  11/01/15  Yes Emmaline Kluver., MD  ibuprofen (ADVIL) 200 MG tablet Take 800 mg by mouth every 6 (six) hours as needed for headache or moderate pain.   Yes [provider]  meloxicam (MOBIC) 15 MG tablet Take 1 tablet (15 mg total) by mouth daily with breakfast. Must last 30 days 02/10/19 08/09/19 Yes Milinda Pointer, MD  pregabalin (LYRICA) 50 MG capsule Take 1 capsule (50 mg total) by mouth 3 (three) times daily. Patient taking differently: Take 100 mg by mouth at bedtime.  02/10/19 08/09/19 Yes Milinda Pointer, MD  tiZANidine (ZANAFLEX) 4 MG tablet Take 1 tablet (4 mg total) by mouth every  8 (eight) hours as needed for muscle spasms. Must last 30 days Patient taking differently: Take 4 mg by mouth every 6 (six) hours as needed for muscle spasms. Must last 30 days 02/10/19 08/09/19 Yes Milinda Pointer, MD  traMADol (ULTRAM) 50 MG tablet Take 1 tablet (50 mg total) by mouth every 12 (twelve) hours as needed for severe pain. Must last 30 days 02/10/19 08/09/19 Yes Milinda Pointer, MD     Positive ROS: All other systems have been reviewed and were otherwise negative with the exception of those mentioned in the HPI and as above.  Physical Exam: General: Alert, no acute distress Cardiovascular: Regular rate and rhythm, no murmurs rubs or gallops.  No pedal edema Respiratory: Clear to auscultation bilaterally, no wheezes rales or rhonchi. No cyanosis, no use of accessory musculature GI: No organomegaly, abdomen is soft and non-tender nondistended with positive bowel sounds. Skin: Skin intact, no lesions within the operative field. Neurologic: Sensation intact distally Psychiatric: Patient is competent for consent with normal mood and affect Lymphatic: No cervical lymphadenopathy  MUSCULOSKELETAL: Right shoulder: Patient skin is intact.  There is no erythema ecchymosis swelling or deformity.  There is no muscle atrophy.  Patient has pain at forward elevation and abduction of approximately 80 to 90 degrees.  She has weakness with shoulder abduction and mild weakness with external rotation but has no weakness with shoulder internal rotation.  She has full digital wrist and elbow range of motion, intact sensation light touch and a palpable radial pulse.  Patient has positive impingement signs but no apprehension or instability.  Assessment: M75.121 complete rotator cuff tear or rupture right shoulder  Plan: Plan for Procedure(s): RIGHT SHOULDER ARTHROSCOPY WITH ROTATOR CUFF REPAIR  I have reviewed the details of the operation as well as the postoperative course.  I also discussed the  risks and benefits of surgery.  She understands the risks include but are not limited to infection, bleeding, nerve or blood vessel injury, joint stiffness or loss of motion, persistent pain, weakness or instability, re-tear of the rotator cuff, failure of the repair or hardware failure and the need for further surgery. Medical risks include but are not limited to DVT and pulmonary embolism, myocardial infarction, stroke, pneumonia, respiratory failure and death. Patient understood these risks and wished to proceed.     Thornton Park, MD   02/24/2019 7:57 AM

## 2019-02-24 NOTE — Transfer of Care (Signed)
Immediate Anesthesia Transfer of Care Note  Patient: Candice Guzman  Procedure(s) Performed: RIGHT SHOULDER ARTHROSCOPY WITH SUBACROMIAL DECOMPRESSION, DISTAL CLAVICLE EXCISION, LYSIS OF ADHESIONS,CAPSULOTOMY,MINI OPEN ROTATOR CUFF REPAIR (Right Shoulder)  Patient Location: PACU  Anesthesia Type:General  Level of Consciousness: drowsy, patient cooperative and responds to stimulation  Airway & Oxygen Therapy: Patient Spontanous Breathing and Patient connected to face mask oxygen  Post-op Assessment: Report given to RN and Post -op Vital signs reviewed and stable  Post vital signs: Reviewed and stable  Last Vitals:  Vitals Value Taken Time  BP 136/67 02/24/19 1206  Temp    Pulse 89 02/24/19 1209  Resp 16 02/24/19 1209  SpO2 100 % 02/24/19 1209  Vitals shown include unvalidated device data.  Last Pain:  Vitals:   02/24/19 1206  PainSc: (P) 0-No pain         Complications: No apparent anesthesia complications

## 2019-02-24 NOTE — Anesthesia Postprocedure Evaluation (Signed)
Anesthesia Post Note  Patient: GENNIFER POTENZA  Procedure(s) Performed: RIGHT SHOULDER ARTHROSCOPY WITH SUBACROMIAL DECOMPRESSION, DISTAL CLAVICLE EXCISION, LYSIS OF ADHESIONS,CAPSULOTOMY,MINI OPEN ROTATOR CUFF REPAIR (Right Shoulder)  Patient location during evaluation: PACU Anesthesia Type: General Level of consciousness: awake and alert Pain management: pain level controlled Vital Signs Assessment: post-procedure vital signs reviewed and stable Respiratory status: spontaneous breathing, nonlabored ventilation, respiratory function stable and patient connected to nasal cannula oxygen Cardiovascular status: blood pressure returned to baseline and stable Postop Assessment: no apparent nausea or vomiting Anesthetic complications: no     Last Vitals:  Vitals:   02/24/19 1221 02/24/19 1236  BP: 130/85 135/67  Pulse: 86 92  Resp: 18 (!) 21  Temp:    SpO2: 100% 96%    Last Pain:  Vitals:   02/24/19 1236  PainSc: 0-No pain                 Precious Haws Cannie Muckle

## 2019-02-25 ENCOUNTER — Ambulatory Visit: Payer: Managed Care, Other (non HMO) | Admitting: Family Medicine

## 2019-04-28 ENCOUNTER — Other Ambulatory Visit: Payer: Self-pay | Admitting: Family Medicine

## 2019-04-28 DIAGNOSIS — G4700 Insomnia, unspecified: Secondary | ICD-10-CM

## 2019-04-28 NOTE — Telephone Encounter (Signed)
Medication Refill - Medication: eszopiclone (LUNESTA) 2 MG TABS tablet  Has the patient contacted their pharmacy? no (Agent: If no, request that the patient contact the pharmacy for the refill.) (Agent: If yes, when and what did the pharmacy advise?)  Preferred Pharmacy (with phone number or street name):  [id number] [address], [place] [phone] (Phone) [phone] (Fax)   Agent: Please be advised that RX refills may take up to 3 business days. We ask that you follow-up with your pharmacy.

## 2019-05-02 NOTE — Telephone Encounter (Signed)
APPT MADE

## 2019-05-04 ENCOUNTER — Other Ambulatory Visit: Payer: Self-pay

## 2019-05-04 ENCOUNTER — Encounter: Payer: Self-pay | Admitting: Family Medicine

## 2019-05-04 ENCOUNTER — Ambulatory Visit: Payer: Managed Care, Other (non HMO) | Admitting: Family Medicine

## 2019-05-04 VITALS — BP 132/82 | HR 83 | Temp 97.3°F | Resp 16 | Ht 64.0 in | Wt 134.3 lb

## 2019-05-04 DIAGNOSIS — F419 Anxiety disorder, unspecified: Secondary | ICD-10-CM

## 2019-05-04 DIAGNOSIS — E559 Vitamin D deficiency, unspecified: Secondary | ICD-10-CM

## 2019-05-04 DIAGNOSIS — E538 Deficiency of other specified B group vitamins: Secondary | ICD-10-CM | POA: Diagnosis not present

## 2019-05-04 DIAGNOSIS — F331 Major depressive disorder, recurrent, moderate: Secondary | ICD-10-CM

## 2019-05-04 DIAGNOSIS — M545 Low back pain: Secondary | ICD-10-CM

## 2019-05-04 DIAGNOSIS — R42 Dizziness and giddiness: Secondary | ICD-10-CM

## 2019-05-04 DIAGNOSIS — M0600 Rheumatoid arthritis without rheumatoid factor, unspecified site: Secondary | ICD-10-CM

## 2019-05-04 DIAGNOSIS — M2550 Pain in unspecified joint: Secondary | ICD-10-CM

## 2019-05-04 DIAGNOSIS — G8929 Other chronic pain: Secondary | ICD-10-CM

## 2019-05-04 DIAGNOSIS — G4700 Insomnia, unspecified: Secondary | ICD-10-CM

## 2019-05-04 MED ORDER — ALPRAZOLAM 0.5 MG PO TABS: 0.5000 mg | ORAL_TABLET | Freq: Every evening | ORAL | 0 refills | Status: DC | PRN

## 2019-05-04 MED ORDER — DULOXETINE HCL 60 MG PO CPEP: 60.0000 mg | ORAL_CAPSULE | Freq: Every day | ORAL | 1 refills | Status: DC

## 2019-05-04 MED ORDER — ARIPIPRAZOLE 2 MG PO TABS: 2.0000 mg | ORAL_TABLET | Freq: Every day | ORAL | 0 refills | Status: DC

## 2019-05-04 MED ORDER — ESZOPICLONE 2 MG PO TABS: 2.0000 mg | ORAL_TABLET | Freq: Every day | ORAL | 0 refills | Status: DC

## 2019-05-04 NOTE — Patient Instructions (Signed)

## 2019-05-04 NOTE — Progress Notes (Signed)
Name: Candice Guzman   MRN: [id number]    DOB: 1960/10/14   Date:05/04/2019       Progress Note  Subjective  Chief Complaint  Chief Complaint  Patient presents with  . Medication Refill  . Depression  . Insomnia    Unchanged  . Back Pain  . Menopause  . Seronegative Rheumatoid Arthritis  . Dizziness    If she leans her head back or leans forward she gets dizzy and has fallen twice    HPI  SeronegativeRheumatoidArthritis: She has been off Simponi forover one [employer]yearbecause insurance stopped paying for it.She is back on Humiraand since [id number] off methotrexate because of hair loss. She is still under the care of Dr. Gavin PottersKernodle.She states a couple of months ago had severe flare, she was sore all over and was prednisone, currently doing well  Insomnia: able to sleep with Ambien CR , able to fall and stay asleep with medication.Medication is not covered , she is now on Lunesta 2 mg  able to fall asleep better but still wakes up during the night because of pain, but able to fall back asleep. She states still not as good as Ambien.   Major Depression:she tried Effexor and Duloxetine, but it did not seem to improve depression symptoms. She states mood is affected by constant pain and also the hot flashes and the fact that she feels like she smells. She denies suicidal thoughts or ideation.Always tired, we tried switching to Sertraline on her last visit but she was unable to tolerate medication and it caused her to feel angry, she called back and resumed Duloxetine ,we tried Seroquel at night but caused her to not rest and had weird dreams. Sleeping with Lunesta. Currently not working because of shoulder surgery. Phq 9 is higher again, she states she will try to stop self isolating as much. She also gets frustrated because she wants to take care of her house and animals and is limited because of RA. She has another horse and a donkey now.   Menopause: she is now on Clonidine BID  she  is also using a topical drysol on axillar - prescribed by Dermatologist. Unchanged  Fall: back in June, she feel backwards in June after an encounter with a snake, she saw Ortho because pain right shoulder was severe, had surgery for debridement and rotator cuff repair 02/2019    Back pain: she states she has a constant lower back tightness ,she saw Dr. Joanna PuffNaveirra for back injections for DDD but insurance denied any more coverage. Still takes Tramadol prn, off gabapentin because it cause worsening of hot flashes, she is now on Lyrica . She states back pain is better   Abnormal sense of taste and smell: saw by ENT and advised to have MRI, she states she is worried about cost.She is now noticing vertigo when she is bed and moves her had. She states symptoms has improved    Patient Active Problem List   Diagnosis Date Noted  . Pain in joint of right shoulder 01/18/2019  . Stiffness of right shoulder joint 01/18/2019  . Muscle weakness 01/18/2019  . Osteoarthritis involving multiple joints 12/07/2018  . Moderate episode of recurrent major depressive disorder (HCC) 08/27/2018  . Foot drop (Right) 08/04/2018  . Abnormal MRI, lumbar spine (06/21/2018) 08/04/2018  . Chronic lumbar radicular pain (L5 dermatome) (Bilateral) (R>L) 05/26/2018  . Long term prescription benzodiazepine use 09/21/2017  . Chronic lower extremity pain (Primary Area of Pain) (Bilateral) (R>L) 09/21/2017  .  Chronic low back pain (Secondary Area of Pain) (Bilateral) (R>L) 09/21/2017  . Pharmacologic therapy 09/21/2017  . Problems influencing health status 09/21/2017  . Spondylosis without myelopathy or radiculopathy, lumbosacral region 09/21/2017  . Chronic hip pain [facility](Tertiary Area of Pain) (Bilateral) (R>L) 09/21/2017  . Chronic shoulder pain (Bilateral) (L>R) 09/21/2017  . Grade 1 Anterolisthesis of L5 over S1 & L3 over L4 09/21/2017  . Grade 1 Retrolisthesis L1 over L2 & L2 over L3 09/21/2017  . DDD (degenerative disc  disease), lumbar 09/21/2017  . 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 Lumbar facet joint syndrome (Bilateral) (R>L) 09/21/2017  . Chronic musculoskeletal pain 09/21/2017  . Neurogenic pain 09/21/2017  . Lumbar foraminal stenosis (L5-S1) (Bilateral) 09/21/2017  . Lumbar lateral recess stenosis (Bilateral) (B: L4-5) (L>R: L3-4) 09/21/2017  . Lumbar facet hypertrophy (Bilateral: L3-4, L4-5, and L5-S1) 09/21/2017  . Chronic pain syndrome 07/08/2017  . Chronic knee pain (Fourth Area of Pain) (Bilateral) 07/08/2017  . Chronic wrist pain (Fifth Area of Pain) (Bilateral) 07/08/2017  . Disorder of skeletal system 07/08/2017  . Other long term (current) drug therapy 07/08/2017  . Long term current use of opiate analgesic 07/08/2017  . Other specified health status 07/08/2017  . Chronic pain of multiple joints 05/20/2017  . Insomnia, persistent 12/18/2014  . Depression, major, single episode, moderate (HCC) 12/18/2014  . Menopausal symptom 12/18/2014  . Vitamin D deficiency 12/18/2014  . Inflammatory arthritis (HCC) 02/23/2014  . Lumbosacral radiculopathy (L5) (Right) 12/12/2013  . Bulge of lumbar disc without myelopathy 06/18/2011    Past Surgical History:  Procedure Laterality Date  . BREAST BIOPSY Left 2010   neg  . CESAREAN SECTION    . FINGER ARTHROPLASTY Left 07/24/2017   Thumb  . GANGLION CYST EXCISION    . METACARPOPHALANGEAL JOINT ARTHROPLASTY Right 06/06/2016   Dr. Dayna BarkerAldridge   . SHOULDER ARTHROSCOPY WITH ROTATOR CUFF REPAIR Right 02/24/2019   Procedure: RIGHT SHOULDER ARTHROSCOPY WITH SUBACROMIAL DECOMPRESSION, DISTAL CLAVICLE EXCISION, LYSIS OF ADHESIONS,CAPSULOTOMY,MINI OPEN ROTATOR CUFF REPAIR;  Surgeon: Juanell FairlyKrasinski, Kevin, MD;  Location: [facility] ORS;  Service: Orthopedics;  Laterality: Right;    Family History  Problem Relation Age of Onset  . Emphysema Mother   . Cancer Father 60       lung  . Cancer Brother        tongue    Social History    Socioeconomic History  . Marital status: Divorced    Spouse name: Not on file  . Number of children: 1  . Years of education: Not on file  . Highest education level: High school graduate  Occupational History  . Not on file  Social Needs  . Financial resource strain: Somewhat hard  . Food insecurity    Worry: Never true    Inability: Never true  . Transportation needs    Medical: No    Non-medical: No  Tobacco Use  . Smoking status: Never Smoker  . Smokeless tobacco: Never Used  Substance and Sexual Activity  . Alcohol use: No    Alcohol/week: 0.0 standard drinks  . Drug use: No  . Sexual activity: Yes    Partners: Male  Lifestyle  . Physical activity    Days per week: 0 days    Minutes per session: 0 min  . Stress: Very much  Relationships  . Social connections    Talks on phone: More than three times a week    Gets together: Never    Attends religious  service: Never    Active member of club or organization: No    Attends meetings of clubs or organizations: Never    Relationship status: Divorced  . Intimate partner violence    Fear of current or ex partner: No    Emotionally abused: No    Physically abused: No    Forced sexual activity: No  Other Topics Concern  . Not on file  Social History Narrative  . Not on file     Current Outpatient Medications:  .  ALPRAZolam (XANAX) 0.5 MG tablet, Take 1 tablet (0.5 mg total) by mouth at bedtime as needed. (Patient taking differently: Take 0.5 mg by mouth at bedtime as needed for anxiety or sleep. ), Disp: 30 tablet, Rfl: 0 .  cloNIDine (CATAPRES) 0.1 MG tablet, Take 1 tablet (0.1 mg total) by mouth 2 (two) times daily., Disp: 180 tablet, Rfl: 0 .  DULoxetine (CYMBALTA) 60 MG capsule, Take 1 capsule (60 mg total) by mouth daily. (Patient taking differently: Take 60 mg by mouth 2 (two) times daily. ), Disp: 90 capsule, Rfl: 1 .  eszopiclone (LUNESTA) 2 MG TABS tablet, Take 1 tablet (2 mg total) by mouth at bedtime.  Take immediately before bedtime, Disp: 30 tablet, Rfl: 0 .  HUMIRA PEN 40 MG/0.8ML PNKT, Inject 40 mg into the skin every 14 (fourteen) days. , Disp: , Rfl: 3 .  meloxicam (MOBIC) 15 MG tablet, TAKE 1 TABLET BY MOUTH ONCE DAILY, Disp: , Rfl:  .  pregabalin (LYRICA) 50 MG capsule, Take 1 capsule (50 mg total) by mouth 3 (three) times daily. (Patient taking differently: Take 100 mg by mouth at bedtime. ), Disp: 270 capsule, Rfl: 1 .  tiZANidine (ZANAFLEX) 4 MG tablet, Take 1 tablet (4 mg total) by mouth every 8 (eight) hours as needed for muscle spasms. Must last 30 days (Patient taking differently: Take 4 mg by mouth every 6 (six) hours as needed for muscle spasms. Must last 30 days), Disp: 90 tablet, Rfl: 5 .  ondansetron (ZOFRAN) 4 MG tablet, Take 1 tablet (4 mg total) by mouth every 8 (eight) hours as needed for nausea or vomiting. (Patient not taking: Reported on 05/04/2019), Disp: 30 tablet, Rfl: 0 .  oxyCODONE (OXY IR/ROXICODONE) 5 MG immediate release tablet, Take 1 tablet (5 mg total) by mouth every 4 (four) hours as needed. (Patient not taking: Reported on 05/04/2019), Disp: 40 tablet, Rfl: 0  Allergies  Allergen Reactions  . Codeine Itching  . Methotrexate Nausea Only    I personally reviewed active problem list, medication list, allergies, family history, social history, health maintenance with the patient/caregiver today.   ROS  Constitutional: Negative for fever or weight change.  Respiratory: Negative for cough and shortness of breath.   Cardiovascular: Negative for chest pain or palpitations.  Gastrointestinal: Negative for abdominal pain, no bowel changes.  Musculoskeletal: Negative for gait problem or joint swelling.  Skin: Negative for rash.  Neurological: Negative for dizziness or headache.  No other specific complaints in a complete review of systems (except as listed in HPI above).  Objective  Vitals:   05/04/19 1028  BP: 132/82  Pulse: 83  Resp: 16  Temp: (!)  97.3 F (36.3 C)  TempSrc: Temporal  SpO2: 96%  Weight: 134 lb 4.8 oz (60.9 kg)  Height:  (1.626 m)    Body mass index is 23.05 kg/m.  Physical Exam  Constitutional: Patient appears well-developed and well-nourished.  No distress.  HEENT: head atraumatic, normocephalic, pupils  equal and reactive to light Cardiovascular: Normal rate, regular rhythm and normal heart sounds.  No murmur heard. No BLE edema. Pulmonary/Chest: Effort normal and breath sounds normal. No respiratory distress. Abdominal: Soft.  There is no tenderness. Muscular Skeletal: mild DIP enlargement on left hand and some nodules on both hands , pain with rom of both shoulders Psychiatric: Patient has a normal mood and affect. behavior is normal. Judgment and thought content normal.  Recent Results (from the past 2160 hour(s))  APTT     Status: Guzman   Collection Time: 02/21/19 11:43 AM  Result Value Ref Range   aPTT 33 24 - 36 seconds    Comment: Performed at [facility], [address] [place], [place] [address]  Basic metabolic panel     Status: Abnormal   Collection Time: 02/21/19 11:43 AM  Result Value Ref Range   Sodium 140 135 - 145 mmol/L   Potassium 4.2 3.5 - 5.1 mmol/L   Chloride 107 98 - 111 mmol/L   CO2 29 22 - 32 mmol/L   Glucose, Bld 104 (H) 70 - 99 mg/dL   BUN 12 6 - 20 mg/dL   Creatinine, Ser 0.69 0.44 - 1.00 mg/dL   Calcium 9.1 8.9 - 10.3 mg/dL   GFR calc non Af Amer >60 >60 mL/min   GFR calc Af Amer >60 >60 mL/min   Anion gap 4 (L) 5 - 15    Comment: Performed at [facility], [address] [place], [place] [address]  CBC WITH DIFFERENTIAL     Status: Guzman   Collection Time: 02/21/19 11:43 AM  Result Value Ref Range   WBC 6.7 4.0 - 10.5 K/uL   RBC 4.37 3.87 - 5.11 MIL/uL   Hemoglobin 12.3 12.0 - 15.0 g/dL   HCT 38.3 36.0 - 46.0 %   MCV 87.6 80.0 - 100.0 fL   MCH 28.1 26.0 - 34.0 pg   MCHC 32.1 30.0 - 36.0 g/dL   RDW 13.6 11.5 - 15.5 %   Platelets 312 150  - 400 K/uL   nRBC 0.0 0.0 - 0.2 %   Neutrophils Relative % 60 %   Neutro Abs 4.0 1.7 - 7.7 K/uL   Lymphocytes Relative 29 %   Lymphs Abs 1.9 0.7 - 4.0 K/uL   Monocytes Relative 6 %   Monocytes Absolute 0.4 0.1 - 1.0 K/uL   Eosinophils Relative 4 %   Eosinophils Absolute 0.3 0.0 - 0.5 K/uL   Basophils Relative 1 %   Basophils Absolute 0.0 0.0 - 0.1 K/uL   Immature Granulocytes 0 %   Abs Immature Granulocytes 0.01 0.00 - 0.07 K/uL    Comment: Performed at [facility], [address] [place], [place] [address]  Protime-INR     Status: Guzman   Collection Time: 02/21/19 11:43 AM  Result Value Ref Range   Prothrombin Time 12.7 11.4 - 15.2 seconds   INR 1.0 0.8 - 1.2    Comment: (NOTE) INR goal varies based on device and disease states. Performed at [facility], [place], [place] [address]   SARS CORONAVIRUS 2 Nasal Swab Aptima Multi Swab     Status: Guzman   Collection Time: 02/21/19 11:43 AM   Specimen: Aptima Multi Swab; Nasal Swab  Result Value Ref Range   SARS Coronavirus 2 NEGATIVE NEGATIVE    Comment: (NOTE) SARS-CoV-2 target nucleic acids are NOT DETECTED. The SARS-CoV-2 RNA is generally detectable in upper and lower respiratory specimens  during the acute phase of infection. Negative results do not preclude SARS-CoV-2 infection, do not rule out co-infections with other pathogens, and should not be used as the sole basis for treatment or other patient management decisions. Negative results must be combined with clinical observations, patient history, and epidemiological information. The expected result is Negative. Fact Sheet for Patients: HairSlick.no Fact Sheet for Healthcare Providers: quierodirigir.com This test is not yet approved or cleared by the [place] FDA and  has been authorized for detection and/or diagnosis of SARS-CoV-2 by FDA under an Emergency Use Authorization  (EUA). This EUA will remain  in effect (meaning this test can be used) for the duration of the COVID-19 declaration under Section 56 4(b)(1) of the Act, 21 U.S.C. section 360bbb-3(b)(1), unless the authorization is terminated or revoked sooner. Performed at [facility], [address], [place], [place] [address]       PHQ2/9: Depression screen [facility] 2/9 05/04/2019 11/26/2018 08/27/2018 07/20/2018 05/26/2018  Decreased Interest 1 1 3  0 3  Down, Depressed, Hopeless 2 2 3  0 3  PHQ - 2 Score 3 3 6  0 6  Altered sleeping 1 2 3  - 3  Tired, decreased energy 3 1 3  - 2  Change in appetite 2 1 1  - 0  Feeling bad or failure about yourself  1 0 0 - 0  Trouble concentrating 1 0 1 - 1  Moving slowly or fidgety/restless 0 0 1 - 0  Suicidal thoughts 0 0 1 - 0  PHQ-9 Score 11 7 16  - 12  Difficult doing work/chores Somewhat difficult Somewhat difficult Extremely dIfficult - Somewhat difficult  Some recent data might be hidden    phq 9 is positive   Fall Risk: Fall Risk  05/04/2019 11/26/2018 08/27/2018 08/04/2018 07/20/2018  Falls in the past year? 1 0 0 0 0  Comment - - - - -  Number falls in past yr: 1 0 0 - -  Injury with Fall? 0 0 0 - -  Comment - - - - -  Risk for fall due to : Impaired balance/gait;Other (Comment) - - - -  Risk for fall due to: Comment dizziness - - - -  Follow up - - - - -     Functional Status Survey: Is the patient deaf or have difficulty hearing?: No Does the patient have difficulty seeing, even when wearing glasses/contacts?: No Does the patient have difficulty concentrating, remembering, or making decisions?: No Does the patient have difficulty walking or climbing stairs?: No Does the patient have difficulty dressing or bathing?: No Does the patient have difficulty doing errands alone such as visiting a doctor's office or shopping?: No    Assessment & Plan  1. Seronegative rheumatoid arthritis (HCC)  Doing well at this time  2. Depression, major,  recurrent, moderate (HCC)  - ARIPiprazole (ABILIFY) 2 MG tablet; Take 1 tablet (2 mg total) by mouth daily.  Dispense: 90 tablet; Refill: 0 - DULoxetine (CYMBALTA) 60 MG capsule; Take 1 capsule (60 mg total) by mouth daily.  Dispense: 90 capsule; Refill: 1  3. Vitamin B12 deficiency  Continue supplementation  4. Vitamin D deficiency  Continue supplementation   5. Chronic pain of multiple joints  Doing well   6. Chronic low back pain without sciatica, unspecified back pain laterality  Under the care of Dr.   7. Anxiety  - ALPRAZolam (XANAX) 0.5 MG tablet; Take 1 tablet (0.5 mg total) by mouth at bedtime as needed for anxiety  or sleep.  Dispense: 30 tablet; Refill: 0  8. Insomnia, persistent  - eszopiclone (LUNESTA) 2 MG TABS tablet; Take 1 tablet (2 mg total) by mouth at bedtime. Take immediately before bedtime  Dispense: 90 tablet; Refill: 0   9. Dizziness  Likely BBPV

## 2019-05-18 ENCOUNTER — Other Ambulatory Visit: Payer: Self-pay | Admitting: Family Medicine

## 2019-05-18 DIAGNOSIS — F419 Anxiety disorder, unspecified: Secondary | ICD-10-CM

## 2019-05-18 NOTE — Telephone Encounter (Signed)
Requested medication (s) are due for refill today: eys  Requested medication (s) are on the active medication list: yes  Last refill:  11/26/2018  Future visit scheduled: yes  Notes to clinic:  Refill cannot be delegated    Requested Prescriptions  Pending Prescriptions Disp Refills   ALPRAZolam (XANAX) 0.5 MG tablet [Pharmacy Med Name: ALPRAZOLAM 0.5MG  TABLETS] 30 tablet     Sig: TAKE 1 TABLET(0.5 MG) BY MOUTH AT BEDTIME AS NEEDED     Not Delegated - Psychiatry:  Anxiolytics/Hypnotics Failed - 05/18/2019 11:49 AM      Failed - This refill cannot be delegated      Failed - Urine Drug Screen completed in last 360 days.      Passed - Valid encounter within last 6 months    Recent Outpatient Visits          2 weeks ago Seronegative rheumatoid arthritis [facility])   [facility] Steele Sizer, MD   5 months ago Well woman exam   [facility] Steele Sizer, MD   8 months ago Seronegative rheumatoid arthritis [facility])   [facility] Steele Sizer, MD   11 months ago Depression, major, single episode, moderate [facility])   [facility] Steele Sizer, MD   1 year ago Depression, major, single episode, moderate [facility])   [facility] Steele Sizer, MD      Future Appointments            In 2 months Milinda Pointer, MD [place]   In 2 months Steele Sizer, MD [facility]

## 2019-06-27 ENCOUNTER — Other Ambulatory Visit: Payer: Self-pay | Admitting: Family Medicine

## 2019-06-27 DIAGNOSIS — N951 Menopausal and female climacteric states: Secondary | ICD-10-CM

## 2019-07-26 ENCOUNTER — Telehealth: Payer: Self-pay

## 2019-07-26 NOTE — Progress Notes (Signed)
Unsuccessful attempt to contact patient for Virtual Visit (Pain Management Telehealth)   Patient provided contact information:  [phone] (home); [phone] (mobile); (Preferred) [phone] [email or web address]   Pre-screening:  Our staff was successful in contacting Ms. Kilgore using the above provided information.   I unsuccessfully attempted to make contact with [name] on 07/27/2019 via telephone. I was unable to complete the virtual encounter due to call going directly to voicemail. I was able to leave a message where I clearly identify myself as [name], MD and I left a message to call us back to reschedule the call.  Pharmacotherapy Assessment  Analgesic: Tramadol 50 mg 1-2tablet p.o. twice daily (100mg /day of tramadol) MME/day:10mg /day.   Sedation: Please see nurses note.  No notes on file  Follow-up plan:   Reschedule Visit.     Interventional management options: Planned, scheduled, and/or pending: Therapeutic right-sided L4-5 interlaminar LESI under fluoroscopic guidance, no sedation Diagnostic right L5 transforaminal ESI#3under fluoroscopic guidance no sedation   Considering: Diagnostic bilateral L5 transforaminal LESI Diagnostic bilateral lumbar facet block Possible bilateral lumbar facet RFA Diagnostic bilateral intra-articular hip joint injection Diagnostic bilateral femoral nerve block + obturator nerve block Possible bilateral femoral nerve + obturator nerve RFA Diagnostic bilateral intra-articular shoulder joint injection Diagnostic bilateral suprascapular nerve block Possible bilateral suprascapular nerve RFA Diagnostic Bilateral intra-articular wrist injections Diagnosticbilateral intra-articular knee injections Possible bilateral intra-articular Hyalgan knee injection Diagnostic bilateral genicular nerve block Possible bilateral genicular nerve RFA   Palliative PRN  treatment(s): Palliative/therapeuticbilateral L5 transforaminal ESI#3under fluoroscopic guidance and IV sedation     Recent Visits No visits were found meeting these conditions.  Showing recent visits within past 90 days and meeting all other requirements   Today's Visits Date Type Provider Dept  07/27/19 Telemedicine 07/29/19, MD [facility]  Showing today's visits and meeting all other requirements   Future Appointments No visits were found meeting these conditions.  Showing future appointments within next 90 days and meeting all other requirements    Note by: Delano Metz, MD Date: 07/27/2019; Time: 4:00 PM

## 2019-07-26 NOTE — Telephone Encounter (Signed)
LM to call office to go over pre virtual appointment questions.

## 2019-07-27 ENCOUNTER — Encounter: Payer: Self-pay | Admitting: Pain Medicine

## 2019-07-27 ENCOUNTER — Ambulatory Visit: Payer: Managed Care, Other (non HMO) | Attending: Pain Medicine | Admitting: Pain Medicine

## 2019-07-27 ENCOUNTER — Other Ambulatory Visit: Payer: Self-pay

## 2019-07-27 DIAGNOSIS — Z5320 Procedure and treatment not carried out because of patient's decision for unspecified reasons: Secondary | ICD-10-CM

## 2019-08-05 ENCOUNTER — Encounter: Payer: Self-pay | Admitting: Family Medicine

## 2019-08-05 ENCOUNTER — Ambulatory Visit (INDEPENDENT_AMBULATORY_CARE_PROVIDER_SITE_OTHER): Payer: Managed Care, Other (non HMO) | Admitting: Family Medicine

## 2019-08-05 DIAGNOSIS — E538 Deficiency of other specified B group vitamins: Secondary | ICD-10-CM

## 2019-08-05 DIAGNOSIS — G8929 Other chronic pain: Secondary | ICD-10-CM

## 2019-08-05 DIAGNOSIS — M545 Low back pain: Secondary | ICD-10-CM

## 2019-08-05 DIAGNOSIS — R030 Elevated blood-pressure reading, without diagnosis of hypertension: Secondary | ICD-10-CM

## 2019-08-05 DIAGNOSIS — F331 Major depressive disorder, recurrent, moderate: Secondary | ICD-10-CM | POA: Diagnosis not present

## 2019-08-05 DIAGNOSIS — M0600 Rheumatoid arthritis without rheumatoid factor, unspecified site: Secondary | ICD-10-CM

## 2019-08-05 DIAGNOSIS — E559 Vitamin D deficiency, unspecified: Secondary | ICD-10-CM

## 2019-08-05 DIAGNOSIS — M2550 Pain in unspecified joint: Secondary | ICD-10-CM

## 2019-08-05 DIAGNOSIS — G4700 Insomnia, unspecified: Secondary | ICD-10-CM

## 2019-08-05 MED ORDER — ESZOPICLONE 2 MG PO TABS: 2.0000 mg | ORAL_TABLET | Freq: Every day | ORAL | 0 refills | Status: DC

## 2019-08-05 NOTE — Progress Notes (Signed)
Name: Candice Guzman   MRN: [id number]    DOB: 1960-08-29   Date:08/05/2019       Progress Note  Subjective  Chief Complaint  Chief Complaint  Patient presents with  . Depression  . Arthritis    She is having a bad flare of arthritis. Dr. Jefm Bryant is out of the office and she needs some prednisone.   . Medication Refill    She needs a refill on Lunesta    I connected with  Candice Guzman  on 08/05/19 at  3:00 PM EST by a video enabled telemedicine application and verified that I am speaking with the correct person using two identifiers.  I discussed the limitations of evaluation and management by telemedicine and the availability of in person appointments. The patient expressed understanding and agreed to proceed. Staff also discussed with the patient that there may be a patient responsible charge related to this service. Patient Location: in her car for better cell phone connection  Provider Location: [facility]   HPI  SeronegativeRheumatoidArthritis: She has been off Simponi for years because insurance stopped paying for it.She is back on Humiraand since [id number] off methotrexate because of hair loss. She is still under the care of Dr. Jefm Bryant.She is having another flare this past week, she states both wrists are very sore, also having pain on her knees. She was just treated with prednisone about 2 weeks ago and it did not complete resolve symptoms , she states he is out of the office today, advised to ask about Dr. Meda Coffee  Insomnia: able to sleep with Ambien CR , able to fall and stay asleep with medication.Medication is not covered ,she is now on Lunesta 2 mg  able to fall asleep better but still wakes up during the night because of pain, but able to fall back asleep.She states still not as good as Ambien. Discussed Good Rx discount and it is $30 at [facility] but she states rather stay on Lunesta at this time  Major Depression:she tried Effexor and  Duloxetine, but it did not seem to improve depression symptoms. She states mood is affected by constant pain. She denies suicidal thoughts or ideation.Always tired, we tried switching to Sertraline on her last visit but she was unable to tolerate medication and it caused her to feel angry, she called back and resumed Duloxetine ,we tried Seroquel at night but caused her to not rest and had weird dreams. Sleeping with Lunesta. Phq 9 is stable down from 11 to 8 she is back to work since shoulder surgery end of Dec 2020 . She states weird sweats and weird taste of smell has resolved   Menopause: she is now on ClonidineBIDshe is also using a topical drysol on axillar - prescribed by Dermatologist.  Doing well now, hot flashes has resolved, and not currently using Drysol because symptoms are controlled   Elevated BP: she states that recently her bp has been elevated, during dentist visit was 170's, she states is has improved since but has not checked bp at home today. Explained that it was very high, she will monitor and return sooner if needed. She states she is in pain , finished prednisone a couple of weeks ago   Back pain: she states she has a constant lower back tightness ,she saw Dr. Cleda Mccreedy for back injections for DDD but insurance denied any more coverage.Still takes Tramadol prn, off gabapentin because it cause worsening of hot flashes, she only takes Lyrica prn, back  is under control at this time   Patient Active Problem List   Diagnosis Date Noted  . Pain in joint of right shoulder 01/18/2019  . Stiffness of right shoulder joint 01/18/2019  . Muscle weakness 01/18/2019  . Osteoarthritis involving multiple joints 12/07/2018  . Moderate episode of recurrent major depressive disorder (HCC) 08/27/2018  . Foot drop (Right) 08/04/2018  . Abnormal MRI, lumbar spine (06/21/2018) 08/04/2018  . Chronic lumbar radicular pain (L5 dermatome) (Bilateral) (R>L) 05/26/2018  . Long term  prescription benzodiazepine use 09/21/2017  . Chronic lower extremity pain (Primary Area of Pain) (Bilateral) (R>L) 09/21/2017  . Chronic low back pain (Secondary Area of Pain) (Bilateral) (R>L) 09/21/2017  . Pharmacologic therapy 09/21/2017  . Problems influencing health status 09/21/2017  . Spondylosis without myelopathy or radiculopathy, lumbosacral region 09/21/2017  . Chronic hip pain [facility] Area of Pain) (Bilateral) (R>L) 09/21/2017  . Chronic shoulder pain (Bilateral) (L>R) 09/21/2017  . Grade 1 Anterolisthesis of L5 over S1 & L3 over L4 09/21/2017  . Grade 1 Retrolisthesis L1 over L2 & L2 over L3 09/21/2017  . DDD (degenerative disc disease), lumbar 09/21/2017  . 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 Lumbar facet joint syndrome (Bilateral) (R>L) 09/21/2017  . Chronic musculoskeletal pain 09/21/2017  . Neurogenic pain 09/21/2017  . Lumbar foraminal stenosis (L5-S1) (Bilateral) 09/21/2017  . Lumbar lateral recess stenosis (Bilateral) (B: L4-5) (L>R: L3-4) 09/21/2017  . Lumbar facet hypertrophy (Bilateral: L3-4, L4-5, and L5-S1) 09/21/2017  . Chronic pain syndrome 07/08/2017  . Chronic knee pain (Fourth Area of Pain) (Bilateral) 07/08/2017  . Chronic wrist pain (Fifth Area of Pain) (Bilateral) 07/08/2017  . Disorder of skeletal system 07/08/2017  . Other long term (current) drug therapy 07/08/2017  . Long term current use of opiate analgesic 07/08/2017  . Other specified health status 07/08/2017  . Chronic pain of multiple joints 05/20/2017  . Insomnia, persistent 12/18/2014  . Depression, major, single episode, moderate (HCC) 12/18/2014  . Menopausal symptom 12/18/2014  . Vitamin D deficiency 12/18/2014  . Inflammatory arthritis (HCC) 02/23/2014  . Lumbosacral radiculopathy (L5) (Right) 12/12/2013  . Bulge of lumbar disc without myelopathy 06/18/2011    Past Surgical History:  Procedure Laterality Date  . BREAST BIOPSY Left 2010   neg    . CESAREAN SECTION    . FINGER ARTHROPLASTY Left 07/24/2017   Thumb  . GANGLION CYST EXCISION    . METACARPOPHALANGEAL JOINT ARTHROPLASTY Right 06/06/2016   Dr. Dayna Barker   . SHOULDER ARTHROSCOPY WITH ROTATOR CUFF REPAIR Right 02/24/2019   Procedure: RIGHT SHOULDER ARTHROSCOPY WITH SUBACROMIAL DECOMPRESSION, DISTAL CLAVICLE EXCISION, LYSIS OF ADHESIONS,CAPSULOTOMY,MINI OPEN ROTATOR CUFF REPAIR;  Surgeon: Juanell Fairly, MD;  Location: [facility] ORS;  Service: Orthopedics;  Laterality: Right;    Family History  Problem Relation Age of Onset  . Emphysema Mother   . Cancer Father 60       lung  . Cancer Brother        tongue    Current Outpatient Medications:  .  ALPRAZolam (XANAX) 0.5 MG tablet, TAKE 1 TABLET(0.5 MG) BY MOUTH AT BEDTIME AS NEEDED, Disp: 30 tablet, Rfl: 0 .  cloNIDine (CATAPRES) 0.1 MG tablet, TAKE 1 TABLET BY MOUTH  TWICE DAILY, Disp: 180 tablet, Rfl: 3 .  DULoxetine (CYMBALTA) 60 MG capsule, Take 1 capsule (60 mg total) by mouth daily., Disp: 90 capsule, Rfl: 1 .  eszopiclone (LUNESTA) 2 MG TABS tablet, Take 1 tablet (2 mg total) by mouth at bedtime. Take  immediately before bedtime, Disp: 90 tablet, Rfl: 0 .  HUMIRA PEN 40 MG/0.8ML PNKT, Inject 40 mg into the skin every 14 (fourteen) days. , Disp: , Rfl: 3 .  meloxicam (MOBIC) 15 MG tablet, TAKE 1 TABLET BY MOUTH ONCE DAILY, Disp: , Rfl:  .  pregabalin (LYRICA) 50 MG capsule, Take 1 capsule (50 mg total) by mouth 3 (three) times daily. (Patient taking differently: Take 100 mg by mouth at bedtime. ), Disp: 270 capsule, Rfl: 1 .  tiZANidine (ZANAFLEX) 4 MG tablet, Take 1 tablet (4 mg total) by mouth every 8 (eight) hours as needed for muscle spasms. Must last 30 days (Patient taking differently: Take 4 mg by mouth every 6 (six) hours as needed for muscle spasms. Must last 30 days), Disp: 90 tablet, Rfl: 5 .  traMADol (ULTRAM) 50 MG tablet, SMARTSIG:1 Tablet(s) By Mouth Every 12 Hours PRN, Disp: , Rfl:  .  ARIPiprazole  (ABILIFY) 2 MG tablet, Take 1 tablet (2 mg total) by mouth daily. (Patient not taking: Reported on 07/27/2019), Disp: 90 tablet, Rfl: 0  Allergies  Allergen Reactions  . Codeine Itching  . Methotrexate Nausea Only    I personally reviewed active problem list, medication list, allergies, family history, social history with the patient/caregiver today.   ROS  Ten systems reviewed and is negative except as mentioned in HPI   Objective  Virtual encounter, vitals not obtained.  There is no height or weight on file to calculate BMI.  Physical Exam  Awake, alert and oriented  PHQ2/9: Depression screen [facility] 2/9 08/05/2019 05/04/2019 11/26/2018 08/27/2018 07/20/2018  Decreased Interest 2 1 1 3  0  Down, Depressed, Hopeless 1 2 2 3  0  PHQ - 2 Score 3 3 3 6  0  Altered sleeping 3 1 2 3  -  Tired, decreased energy 2 3 1 3  -  Change in appetite 0 2 1 1  -  Feeling bad or failure about yourself  0 1 0 0 -  Trouble concentrating 0 1 0 1 -  Moving slowly or fidgety/restless 0 0 0 1 -  Suicidal thoughts 0 0 0 1 -  PHQ-9 Score 8 11 7 16  -  Difficult doing work/chores Not difficult at all Somewhat difficult Somewhat difficult Extremely dIfficult -  Some recent data might be hidden   PHQ-2/9 Result is positive.    Fall Risk: Fall Risk  08/05/2019 05/04/2019 11/26/2018 08/27/2018 08/04/2018  Falls in the past year? 0 1 0 0 0  Comment - - - - -  Number falls in past yr: 0 1 0 0 -  Injury with Fall? 0 0 0 0 -  Comment - - - - -  Risk for fall due to : - Impaired balance/gait;Other (Comment) - - -  Risk for fall due to: Comment - dizziness - - -  Follow up - - - - -     Assessment & Plan   1. Seronegative rheumatoid arthritis (HCC)  Contact Dr.   2. Depression, major, recurrent, moderate (HCC)   3. Vitamin B12 deficiency  Continue supplementation   4. Chronic low back pain without sciatica, unspecified back pain laterality   5. Chronic pain of multiple joints   6.  Vitamin D deficiency  Take vitamin D   7. Insomnia, persistent  - eszopiclone (LUNESTA) 2 MG TABS tablet; Take 1 tablet (2 mg total) by mouth at bedtime. Take immediately before bedtime  Dispense: 90 tablet; Refill: 0  8. Elevated BP without diagnosis of  hypertension  She will monitor at home and return sooner for in person visit if remains high above 140/90 and we will check labs and start medication if needed. Offered prn medication but she would like to hold off for now  I discussed the assessment and treatment plan with the patient. The patient was provided an opportunity to ask questions and all were answered. The patient agreed with the plan and demonstrated an understanding of the instructions.  The patient was advised to call back or seek an in-person evaluation if the symptoms worsen or if the condition fails to improve as anticipated.  I provided 25 minutes of non-face-to-face time during this encounter.

## 2019-08-09 ENCOUNTER — Encounter: Payer: Self-pay | Admitting: Pain Medicine

## 2019-08-10 ENCOUNTER — Ambulatory Visit: Payer: Managed Care, Other (non HMO) | Attending: Pain Medicine | Admitting: Pain Medicine

## 2019-08-10 ENCOUNTER — Other Ambulatory Visit: Payer: Self-pay

## 2019-08-10 DIAGNOSIS — M5441 Lumbago with sciatica, right side: Secondary | ICD-10-CM

## 2019-08-10 DIAGNOSIS — M79604 Pain in right leg: Secondary | ICD-10-CM

## 2019-08-10 DIAGNOSIS — G894 Chronic pain syndrome: Secondary | ICD-10-CM | POA: Diagnosis not present

## 2019-08-10 DIAGNOSIS — G8929 Other chronic pain: Secondary | ICD-10-CM

## 2019-08-10 DIAGNOSIS — M5442 Lumbago with sciatica, left side: Secondary | ICD-10-CM | POA: Diagnosis not present

## 2019-08-10 DIAGNOSIS — M25552 Pain in left hip: Secondary | ICD-10-CM

## 2019-08-10 DIAGNOSIS — M25562 Pain in left knee: Secondary | ICD-10-CM

## 2019-08-10 DIAGNOSIS — M25561 Pain in right knee: Secondary | ICD-10-CM

## 2019-08-10 DIAGNOSIS — M792 Neuralgia and neuritis, unspecified: Secondary | ICD-10-CM

## 2019-08-10 DIAGNOSIS — M25551 Pain in right hip: Secondary | ICD-10-CM

## 2019-08-10 DIAGNOSIS — M79605 Pain in left leg: Secondary | ICD-10-CM

## 2019-08-10 DIAGNOSIS — M7918 Myalgia, other site: Secondary | ICD-10-CM

## 2019-08-10 MED ORDER — TIZANIDINE HCL 4 MG PO TABS: 4.0000 mg | ORAL_TABLET | Freq: Three times a day (TID) | ORAL | 5 refills | Status: DC | PRN

## 2019-08-10 MED ORDER — PREGABALIN 75 MG PO CAPS: 75.0000 mg | ORAL_CAPSULE | Freq: Three times a day (TID) | ORAL | 0 refills | Status: DC

## 2019-08-10 NOTE — Progress Notes (Signed)
Patient: Candice Guzman  Service Category: E/M  Provider: [name], MD  DOB: 15-Jul-1960  DOS: 08/10/2019  Location: Office  MRN: [id number]  Setting: Ambulatory outpatient  Referring Provider: Alba Cory, MD  Type: Established Patient  Specialty: Interventional Pain Management  PCP: Alba Cory, MD  Location: Remote location  Delivery: TeleHealth     Virtual Encounter - Pain Management PROVIDER NOTE: Information contained herein reflects review and annotations entered in association with encounter. Interpretation of such information and data should be left to medically-trained personnel. Information provided to patient can be located elsewhere in the medical record under "Patient Instructions". Document created using STT-dictation technology, any transcriptional errors that may result from process are unintentional.    Contact & Pharmacy Preferred: [phone] Home: [phone] (home) Mobile: [phone] (mobile) E-mail: [email or web address] .[id number] [place], [place] - [address] AT [facility] [address] [place] [address] Phone: [phone] Fax: [phone]  [facility] - [place], [place] - [address] [facility] [address] [place] [address] [place] [address] Phone: [phone] Fax: [phone]   Pre-screening  Candice Guzman offered "in-person" vs "virtual" encounter. She indicated preferring virtual for this encounter.   Reason COVID-19*  Social distancing based on CDC and AMA recommendations.   I contacted Candice Guzman on 08/10/2019 via telephone.      I clearly identified myself as [name], MD. I verified that I was speaking with the correct person using two identifiers (Name: Candice Guzman, and date of birth: [birth year]).  Consent I sought verbal advanced consent from Candice Guzman for virtual visit interactions. I informed Candice Guzman of possible security  and privacy concerns, risks, and limitations associated with providing "not-in-person" medical evaluation and management services. I also informed Candice Guzman of the availability of "in-person" appointments. Finally, I informed her that there would be a charge for the virtual visit and that she could be  personally, fully or partially, financially responsible for it. Candice Guzman expressed understanding and agreed to proceed.   Historic Elements   Candice Guzman is a 59 y.o. year old, female patient evaluated today after her last encounter by our practice on 07/26/2019. Candice Guzman  has a past medical history of Depression, Dyspnea, Insomnia, Lumbar herniated disc, Menopause, Rheumatoid arthritis (HCC), Shoulder pain, and Vitamin D deficiency. She also  has a past surgical history that includes Ganglion cyst excision; Cesarean section; Breast biopsy (Left, 2010); Metacarpophalangeal joint arthroplasty (Right, 06/06/2016); Finger arthroplasty (Left, 07/24/2017); and Shoulder arthroscopy with rotator cuff repair (Right, 02/24/2019). Candice Guzman has a current medication list which includes the following prescription(s): alprazolam, clonidine, duloxetine, eszopiclone, humira pen, meloxicam, pregabalin, tizanidine, tramadol, and aripiprazole. She  reports that she has never smoked. She has never used smokeless tobacco. She reports that she does not drink alcohol or use drugs. Candice Guzman is allergic to codeine and methotrexate.   HPI  Today, she is being contacted for medication management.  The patient indicates doing well with the current medication regimen. No adverse reactions or side effects reported to the medications.  The patient indicates that she is not having any problems with the Lyrica at 50 mg p.o. 3 times daily, but she is also not getting a whole lot of benefit.  Today we talked about the medication and we have decided to go up to 75 mg 3 times daily and to continue titrating it up, as tolerated,  until we get adequate results.  The patient was reminded to take the tizanidine 3 times a day and to avoid going up on it.  She is also taking ibuprofen OTC 200 mg, 4 tablets p.o. twice daily.  She denies any GI side effects or problems with the medicine.  She has identified that this medicine actually helps her more than the tramadol as a consequence, she is actually using the tramadol 1 tablet p.o. a maximum of 3 times per week.  She indicates having quite a bit of tramadol left and not needing any at this point.  Since I will continue to titrate her Lyrica, we have agreed to an older encounter in approximately 1 month to evaluate the Lyrica and to increase it further, if tolerated.  Pharmacotherapy Assessment  Analgesic: Tramadol 50 mg 1-2tablet p.o. twice daily (100mg /day of tramadol) MME/day:10mg /day.   Monitoring: [place] PMP: PDMP reviewed during this encounter.       Pharmacotherapy: No side-effects or adverse reactions reported. Compliance: No problems identified. Effectiveness: Clinically acceptable. Plan: Refer to "POC".  UDS:  Summary  Date Value Ref Range Status  01/05/2018 FINAL  Final    Comment:    ==================================================================== TOXASSURE SELECT 13 (MW) ==================================================================== Test                             Result       Flag       Units Drug Present and Declared for Prescription Verification   Tramadol                       2773         EXPECTED   ng/mg creat   O-Desmethyltramadol            6615         EXPECTED   ng/mg creat   N-Desmethyltramadol            3080         EXPECTED   ng/mg creat    Source of tramadol is a prescription medication.    O-desmethyltramadol and N-desmethyltramadol are expected    metabolites of tramadol. Drug Absent but Declared for Prescription Verification   Alprazolam                     Not Detected UNEXPECTED ng/mg  creat ==================================================================== Test                      Result    Flag   Units      Ref Range   Creatinine              40               mg/dL      >=20 ==================================================================== Declared Medications:  The flagging and interpretation on this report are based on the  following declared medications.  Unexpected results may arise from  inaccuracies in the declared medications.  **Note: The testing scope of this panel includes these medications:  Alprazolam (Xanax)  Tramadol (Ultram)  **Note: The testing scope of this panel does not include following  reported medications:  Adalimumab (Humira)  Clonidine (Catapres)  Duloxetine (Cymbalta)  Estradiol (Estrace)  Methotrexate  Zolpidem (Ambien) ==================================================================== For clinical consultation, please call [phone]. ====================================================================    Laboratory Chemistry Profile (12 mo)  Renal: 11/26/2018: BUN/Creatinine Ratio 11 02/21/2019: BUN 12; Creatinine, Ser  0.69  Lab Results  Component Value Date   GFRAA >60 02/21/2019   GFRNONAA >60 02/21/2019   Hepatic: 11/26/2018: Albumin 4.2 Lab Results  Component Value Date   AST 27 11/26/2018   ALT 26 11/26/2018   Other: 11/26/2018: Vitamin B-12 356  Note: Above Lab results reviewed.  Imaging  [place] OR NERVE BLOCK-IMAGE ONLY [facility]) There is no interpretation for this exam.    This order is for images obtained during a surgical procedure.  Please See  "Surgeries" Tab for more information regarding the procedure.   Assessment  There were no encounter diagnoses.  Plan of Care  Problem-specific:  No problem-specific Assessment & Plan notes found for this encounter.  I am having Candice Guzman maintain her Humira Pen, tiZANidine, pregabalin, meloxicam, ARIPiprazole, DULoxetine, ALPRAZolam, cloNIDine,  traMADol, and eszopiclone.  Pharmacotherapy (Medications Ordered): No orders of the defined types were placed in this encounter.  Orders:  No orders of the defined types were placed in this encounter.  Follow-up plan:   No follow-ups on file.      Interventional management options: Planned, scheduled, and/or pending: Therapeutic right-sided L4-5 interlaminar LESI under fluoroscopic guidance, no sedation Diagnostic right L5 transforaminal ESI#3under fluoroscopic guidance no sedation   Considering: Diagnostic bilateral L5 transforaminal LESI Diagnostic bilateral lumbar facet block Possible bilateral lumbar facet RFA Diagnostic bilateral intra-articular hip joint injection Diagnostic bilateral femoral nerve block + obturator nerve block Possible bilateral femoral nerve + obturator nerve RFA Diagnostic bilateral intra-articular shoulder joint injection Diagnostic bilateral suprascapular nerve block Possible bilateral suprascapular nerve RFA Diagnostic Bilateral intra-articular wrist injections Diagnosticbilateral intra-articular knee injections Possible bilateral intra-articular Hyalgan knee injection Diagnostic bilateral genicular nerve block Possible bilateral genicular nerve RFA   Palliative PRN treatment(s): Palliative/therapeuticbilateral L5 transforaminal ESI#3under fluoroscopic guidance and IV sedation      Recent Visits No visits were found meeting these conditions.  Showing recent visits within past 90 days and meeting all other requirements   Today's Visits Date Type Provider Dept  08/10/19 Telemedicine Delano Metz, MD [facility]  Showing today's visits and meeting all other requirements   Future Appointments No visits were found meeting these conditions.  Showing future appointments within next 90 days and meeting all other requirements   I discussed the assessment and treatment plan with the patient. The patient  was provided an opportunity to ask questions and all were answered. The patient agreed with the plan and demonstrated an understanding of the instructions.  Patient advised to call back or seek an in-person evaluation if the symptoms or condition worsens.  Duration of encounter: 15 minutes.  Note by: [name], MD Date: 08/10/2019; Time: 7:25 AM

## 2019-09-06 ENCOUNTER — Telehealth: Payer: Self-pay | Admitting: *Deleted

## 2019-09-06 NOTE — Progress Notes (Deleted)
Patient: Candice Guzman  Service Category: E/M  Provider: Gaspar Cola, MD  DOB: 10-08-1960  DOS: 09/07/2019  Location: Office  MRN: [id number]  Setting: Ambulatory outpatient  Referring Provider: Steele Sizer, MD  Type: Established Patient  Specialty: Interventional Pain Management  PCP: Candice Sizer, MD  Location: Remote location  Delivery: TeleHealth     Virtual Encounter - Pain Management PROVIDER NOTE: Information contained herein reflects review and annotations entered in association with encounter. Interpretation of such information and data should be left to medically-trained personnel. Information provided to patient can be located elsewhere in the medical record under "Patient Instructions". Document created using STT-dictation technology, any transcriptional errors that may result from process are unintentional.    Contact & Pharmacy Preferred: [phone] Home: [phone] (home) Mobile: [phone] (mobile) E-mail: [email or web address] .[id number] [address], [place] [address] [place] [address] Phone: [phone] Fax: [place], [facility] [address] [place] [address] [place] [address] Phone: [phone] Fax: [phone]   Pre-screening  Candice Guzman offered "in-person" vs "virtual" encounter. She indicated preferring virtual for this encounter.   Reason COVID-19*  Social distancing based on CDC and AMA recommendations.   I contacted Candice Guzman on 09/07/2019 via telephone.      I clearly identified myself as Candice Cola, MD. I verified that I was speaking with the correct person using two identifiers (Name: Candice Guzman, and date of birth: [birth year]).  Consent I sought verbal advanced consent from Candice Guzman for virtual visit interactions. I informed Candice Guzman of possible security  and privacy concerns, risks, and limitations associated with providing "not-in-person" medical evaluation and management services. I also informed Candice Guzman of the availability of "in-person" appointments. Finally, I informed her that there would be a charge for the virtual visit and that she could be  personally, fully or partially, financially responsible for it. [name] expressed understanding and agreed to proceed.   Historic Elements   Candice Guzman is a 59 y.o. year old, female patient evaluated today after her last contact with our practice on 07/26/2019. Candice Guzman  has a past medical history of Depression, Dyspnea, Insomnia, Lumbar herniated disc, Menopause, Rheumatoid arthritis ([place]), Shoulder pain, and Vitamin D deficiency. She also  has a past surgical history that includes Ganglion cyst excision; Cesarean section; Breast biopsy (Left, 2010); Metacarpophalangeal joint arthroplasty (Right, 06/06/2016); Finger arthroplasty (Left, 07/24/2017); and Shoulder arthroscopy with rotator cuff repair (Right, 02/24/2019). Candice Guzman has a current medication list which includes the following prescription(s): alprazolam, clonidine, duloxetine, eszopiclone, humira pen, meloxicam, pregabalin, tizanidine, and tramadol. She  reports that she has never smoked. She has never used smokeless tobacco. She reports that she does not drink alcohol or use drugs. Candice Guzman is allergic to codeine and methotrexate.   HPI  Today, she is being contacted for medication management.  Today's encounter is to evaluate the Lyrica titration.  Pharmacotherapy Assessment  Analgesic: Tramadol 50 mg 1-2tablet p.o. twice daily (147m/day of tramadol) MME/day:165mday.   Monitoring: [place] PMP: PDMP reviewed during this encounter.       Pharmacotherapy: No side-effects or adverse reactions reported. Compliance: No problems identified. Effectiveness: Clinically acceptable. Plan: Refer to "POC".  UDS:  Summary  Date  Value Ref Range Status  01/05/2018 FINAL  Final    Comment:    ====================================================================  TOXASSURE SELECT 13 (MW) ==================================================================== Test                             Result       Flag       Units Drug Present and Declared for Prescription Verification   Tramadol                       2773         EXPECTED   ng/mg creat   O-Desmethyltramadol            6615         EXPECTED   ng/mg creat   N-Desmethyltramadol            3080         EXPECTED   ng/mg creat    Source of tramadol is a prescription medication.    O-desmethyltramadol and N-desmethyltramadol are expected    metabolites of tramadol. Drug Absent but Declared for Prescription Verification   Alprazolam                     Not Detected UNEXPECTED ng/mg creat ==================================================================== Test                      Result    Flag   Units      Ref Range   Creatinine              40               mg/dL      >=20 ==================================================================== Declared Medications:  The flagging and interpretation on this report are based on the  following declared medications.  Unexpected results may arise from  inaccuracies in the declared medications.  **Note: The testing scope of this panel includes these medications:  Alprazolam (Xanax)  Tramadol (Ultram)  **Note: The testing scope of this panel does not include following  reported medications:  Adalimumab (Humira)  Clonidine (Catapres)  Duloxetine (Cymbalta)  Estradiol (Estrace)  Methotrexate  Zolpidem (Ambien) ==================================================================== For clinical consultation, please call [phone]. ====================================================================    Laboratory Chemistry Profile   Renal Lab Results  Component Value Date   BUN 12 02/21/2019   CREATININE 0.69  02/21/2019   BCR 11 11/26/2018   GFRAA >60 02/21/2019   GFRNONAA >60 02/21/2019    Hepatic Lab Results  Component Value Date   AST 27 11/26/2018   ALT 26 11/26/2018   ALBUMIN 4.2 11/26/2018   ALKPHOS 131 (H) 11/26/2018    Electrolytes Lab Results  Component Value Date   NA 140 02/21/2019   K 4.2 02/21/2019   CL 107 02/21/2019   CALCIUM 9.1 02/21/2019   MG 2.1 07/08/2017    Bone Lab Results  Component Value Date   25OHVITD1 24 (L) 07/08/2017   25OHVITD2 <1.0 07/08/2017   25OHVITD3 24 07/08/2017    Inflammation (CRP: Acute Phase) (ESR: Chronic Phase) Lab Results  Component Value Date   CRP 2.2 07/08/2017   ESRSEDRATE CANCELED 07/08/2017      Note: Above Lab results reviewed.  Imaging  [place] OR NERVE BLOCK-IMAGE ONLY [facility]) There is no interpretation for this exam.    This order is for images obtained during a surgical procedure.  Please See  "Surgeries" Tab for more information regarding the procedure.  Assessment  There were no encounter diagnoses.  Plan of Care  Problem-specific:  No problem-specific Assessment & Plan notes found for this encounter.  Candice Guzman has a current medication list which includes the following long-term medication(s): clonidine, duloxetine, eszopiclone, humira pen, pregabalin, and tizanidine.  Pharmacotherapy (Medications Ordered): No orders of the defined types were placed in this encounter.  Orders:  No orders of the defined types were placed in this encounter.  Follow-up plan:   No follow-ups on file.      Interventional management options: Planned, scheduled, and/or pending: Therapeutic right-sided L4-5 interlaminar LESI under fluoroscopic guidance, no sedation Diagnostic right L5 transforaminal ESI#3under fluoroscopic guidance no sedation   Considering: Diagnostic bilateral L5 transforaminal LESI Diagnostic bilateral lumbar facet block Possible bilateral lumbar facet RFA Diagnostic bilateral  intra-articular hip joint injection Diagnostic bilateral femoral nerve block + obturator nerve block Possible bilateral femoral nerve + obturator nerve RFA Diagnostic bilateral intra-articular shoulder joint injection Diagnostic bilateral suprascapular nerve block Possible bilateral suprascapular nerve RFA Diagnostic Bilateral intra-articular wrist injections Diagnosticbilateral intra-articular knee injections Possible bilateral intra-articular Hyalgan knee injection Diagnostic bilateral genicular nerve block Possible bilateral genicular nerve RFA   Palliative PRN treatment(s): Palliative/therapeuticbilateral L5 transforaminal ESI#3under fluoroscopic guidance and IV sedation       Recent Visits Date Type Provider Dept  08/10/19 Telemedicine Milinda Pointer, MD [facility]  Showing recent visits within past 90 days and meeting all other requirements   Future Appointments Date Type Provider Dept  09/07/19 Appointment Milinda Pointer, MD [facility]  Showing future appointments within next 90 days and meeting all other requirements   I discussed the assessment and treatment plan with the patient. The patient was provided an opportunity to ask questions and all were answered. The patient agreed with the plan and demonstrated an understanding of the instructions.  Patient advised to call back or seek an in-person evaluation if the symptoms or condition worsens.  Duration of encounter: *** minutes.  Note by: Candice Cola, MD Date: 09/07/2019; Time: 7:11 AM

## 2019-09-06 NOTE — Telephone Encounter (Signed)
Attempted to call for pre appointment review of allergies/meds. Message left at mobile number. Unable to leave a message at home number.

## 2019-09-07 ENCOUNTER — Telehealth: Payer: Managed Care, Other (non HMO) | Admitting: Pain Medicine

## 2019-09-20 ENCOUNTER — Telehealth: Payer: Self-pay

## 2019-09-20 NOTE — Progress Notes (Signed)
Patient: Candice Guzman  Service Category: E/M  Provider: Gaspar Cola, MD  DOB: 05/28/1961  DOS: 09/21/2019  Location: Office  MRN: [id number]  Setting: Ambulatory outpatient  Referring Provider: Steele Sizer, MD  Type: Established Patient  Specialty: Interventional Pain Management  PCP: Steele Sizer, MD  Location: Remote location  Delivery: TeleHealth     Virtual Encounter - Pain Management PROVIDER NOTE: Information contained herein reflects review and annotations entered in association with encounter. Interpretation of such information and data should be left to medically-trained personnel. Information provided to patient can be located elsewhere in the medical record under "Patient Instructions". Document created using STT-dictation technology, any transcriptional errors that may result from process are unintentional.    Contact & Pharmacy Preferred: [phone] Home: [phone] (home) Mobile: [phone] (mobile) E-mail: [email or web address] .[id number] [address], [place] [address] [place] [address] Phone: [phone] Fax: [place], [facility] [address] [place] [address] [place] [address] Phone: [phone] Fax: [phone]   Pre-screening  Candice Guzman offered "in-person" vs "virtual" encounter. She indicated preferring virtual for this encounter.   Reason COVID-19*  Social distancing based on CDC and AMA recommendations.   I contacted Candice Guzman on 09/21/2019 via telephone.      I clearly identified myself as Gaspar Cola, MD. I verified that I was speaking with the correct person using two identifiers (Name: Candice Guzman, and date of birth: [birth year]).  Consent I sought verbal advanced consent from Candice Guzman for virtual visit interactions. I informed Candice Guzman of possible  security and privacy concerns, risks, and limitations associated with providing "not-in-person" medical evaluation and management services. I also informed Candice Guzman of the availability of "in-person" appointments. Finally, I informed her that there would be a charge for the virtual visit and that she could be  personally, fully or partially, financially responsible for it. Candice Guzman expressed understanding and agreed to proceed.   Historic Elements   Candice Guzman is a 59 y.o. year old, female patient evaluated today after her last contact with our practice on 09/20/2019. Candice Guzman  has a past medical history of Depression, Dyspnea, Insomnia, Lumbar herniated disc, Menopause, Rheumatoid arthritis ([place]), Shoulder pain, and Vitamin D deficiency. She also  has a past surgical history that includes Ganglion cyst excision; Cesarean section; Breast biopsy (Left, 2010); Metacarpophalangeal joint arthroplasty (Right, 06/06/2016); Finger arthroplasty (Left, 07/24/2017); and Shoulder arthroscopy with rotator cuff repair (Right, 02/24/2019). Candice Guzman has a current medication list which includes the following prescription(s): alprazolam, clonidine, duloxetine, eszopiclone, humira pen, tizanidine, meloxicam, pregabalin, and tramadol. She  reports that she has never smoked. She has never used smokeless tobacco. She reports that she does not drink alcohol or use drugs. Candice Guzman is allergic to codeine and methotrexate.   HPI  Today, she is being contacted for medication management. Encounter to evaluate Lyrica and review if she is still taking Meloxicam & tramadol.  The patient indicates doing well with the current medication regimen. No adverse reactions or side effects reported to the medications.   Pharmacotherapy Assessment  Analgesic: Tramadol 50 mg 1-2tablet p.o. twice daily (156m/day of tramadol) MME/day:138mday.   Monitoring: [place] PMP: PDMP reviewed during this encounter.        Pharmacotherapy: No side-effects or adverse reactions reported. Compliance: No problems identified. Effectiveness: Clinically acceptable.  Plan: Refer to "POC".  UDS:  Summary  Date Value Ref Range Status  01/05/2018 FINAL  Final    Comment:    ==================================================================== TOXASSURE SELECT 13 (MW) ==================================================================== Test                             Result       Flag       Units Drug Present and Declared for Prescription Verification   Tramadol                       2773         EXPECTED   ng/mg creat   O-Desmethyltramadol            6615         EXPECTED   ng/mg creat   N-Desmethyltramadol            3080         EXPECTED   ng/mg creat    Source of tramadol is a prescription medication.    O-desmethyltramadol and N-desmethyltramadol are expected    metabolites of tramadol. Drug Absent but Declared for Prescription Verification   Alprazolam                     Not Detected UNEXPECTED ng/mg creat ==================================================================== Test                      Result    Flag   Units      Ref Range   Creatinine              40               mg/dL      >=20 ==================================================================== Declared Medications:  The flagging and interpretation on this report are based on the  following declared medications.  Unexpected results may arise from  inaccuracies in the declared medications.  **Note: The testing scope of this panel includes these medications:  Alprazolam (Xanax)  Tramadol (Ultram)  **Note: The testing scope of this panel does not include following  reported medications:  Adalimumab (Humira)  Clonidine (Catapres)  Duloxetine (Cymbalta)  Estradiol (Estrace)  Methotrexate  Zolpidem (Ambien) ==================================================================== For clinical consultation, please call [phone]. ====================================================================    Laboratory Chemistry Profile   Renal Lab Results  Component Value Date   BUN 12 02/21/2019   CREATININE 0.69 02/21/2019   BCR 11 11/26/2018   GFRAA >60 02/21/2019   GFRNONAA >60 02/21/2019    Hepatic Lab Results  Component Value Date   AST 27 11/26/2018   ALT 26 11/26/2018   ALBUMIN 4.2 11/26/2018   ALKPHOS 131 (H) 11/26/2018    Electrolytes Lab Results  Component Value Date   NA 140 02/21/2019   K 4.2 02/21/2019   CL 107 02/21/2019   CALCIUM 9.1 02/21/2019   MG 2.1 07/08/2017    Bone Lab Results  Component Value Date   25OHVITD1 24 (L) 07/08/2017   25OHVITD2 <1.0 07/08/2017   25OHVITD3 24 07/08/2017    Inflammation (CRP: Acute Phase) (ESR: Chronic Phase) Lab Results  Component Value Date   CRP 2.2 07/08/2017   ESRSEDRATE CANCELED 07/08/2017      Note: Above Lab results reviewed.  Imaging  [place] OR NERVE BLOCK-IMAGE ONLY [facility]) There is no interpretation for this exam.    This order is for images obtained during a surgical procedure.  Please See  "Surgeries" Tab for more information regarding the procedure.  Assessment  The primary encounter diagnosis was Chronic pain syndrome. 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  Plan of Care  Problem-specific:  No problem-specific Assessment & Plan notes found for this encounter.  Candice Guzman has a current medication  list which includes the following long-term medication(s): clonidine, duloxetine, eszopiclone, humira pen, tizanidine, meloxicam, pregabalin, and tramadol.  Pharmacotherapy (Medications Ordered): Meds ordered this encounter  Medications  . pregabalin (LYRICA) 75 MG capsule    Sig: Take 1 capsule (75 mg total) by mouth 3 (three) times daily.    Dispense:  90 capsule    Refill:  5    Fill one day early if pharmacy is closed on scheduled refill date. May substitute for generic if available.  . meloxicam (MOBIC) 15 MG tablet    Sig: Take 1 tablet (15 mg total) by mouth daily with breakfast. Must last 30 days    Dispense:  90 tablet    Refill:  1    Fill one day early if pharmacy is closed on scheduled refill date. May substitute for generic if available.  . traMADol (ULTRAM) 50 MG tablet    Sig: Take 1 tablet (50 mg total) by mouth every 12 (twelve) hours as needed for severe pain. Must last 30 days    Dispense:  60 tablet    Refill:  5    Chronic Pain: STOP Act (Not applicable) Fill 1 day early if closed on refill date. Do not fill until: 09/21/2019. To last until: 03/19/2020. Avoid benzodiazepines within 8 hours of opioids  . tiZANidine (ZANAFLEX) 4 MG tablet    Sig: Take 1 tablet (4 mg total) by mouth every 8 (eight) hours as needed for muscle spasms. Must last 30 days    Dispense:  90 tablet    Refill:  5    Fill one day early if pharmacy is closed on scheduled refill date. May substitute for generic if available.   Orders:  Orders Placed This Encounter  Procedures  . ToxASSURE Select 13 (MW), Urine    Volume: 30 ml(s). Minimum 3 ml of urine is needed. Document temperature of fresh sample. Indications: Long term (current) use of opiate analgesic (Q25.956)    Order Specific Question:   Release to patient    Answer:   Immediate  . Comp. Metabolic Panel (12)    With GFR. Indications: Chronic Pain Syndrome (G89.4) & Pharmacotherapy (L87.564)    Order Specific Question:   Has the  patient fasted?    Answer:   No    Order Specific Question:   CC Results    Answer:   [id number] [[id number]]    Order Specific Question:   Release to patient    Answer:   Immediate  . Magnesium    Indication: Pharmacologic therapy (Z79.899)    Order Specific Question:   CC Results    Answer:   PCP-NURSE [[id number]]    Order Specific Question:   Release to patient    Answer:   Immediate  . Vitamin  B12    Indication: Pharmacologic therapy (O70.962).    Order Specific Question:   CC Results    Answer:   [id number] [[id number]]    Order Specific Question:   Release to patient    Answer:   Immediate  . Sedimentation rate    Indication: Disorder of skeletal system (M89.9)    Order Specific Question:   CC Results    Answer:   PCP-NURSE [[id number]]    Order Specific Question:   Release to patient    Answer:   Immediate  . 25-Hydroxy vitamin D Lcms D2+D3    Indication: Disorder of skeletal system (M89.9).    Order Specific Question:   CC Results    Answer:   [id number] [[id number]]    Order Specific Question:   Release to patient    Answer:   Immediate  . C-reactive protein    Indication: Problems influencing health status (Z78.9)    Order Specific Question:   CC Results    Answer:   PCP-NURSE [[id number]]    Order Specific Question:   Release to patient    Answer:   Immediate   Follow-up plan:   Return in about 6 months (around 03/19/2020) for (VV), (MM) to review labs.      Interventional management options: Planned, scheduled, and/or pending: Therapeutic right-sided L4-5 interlaminar LESI under fluoroscopic guidance, no sedation Diagnostic right L5 transforaminal ESI#3under fluoroscopic guidance no sedation   Considering: Diagnostic bilateral L5 transforaminal LESI Diagnostic bilateral lumbar facet block Possible bilateral lumbar facet RFA Diagnostic bilateral intra-articular hip joint injection Diagnostic bilateral femoral nerve block + obturator nerve block Possible bilateral  femoral nerve + obturator nerve RFA Diagnostic bilateral intra-articular shoulder joint injection Diagnostic bilateral suprascapular nerve block Possible bilateral suprascapular nerve RFA Diagnostic Bilateral intra-articular wrist injections Diagnosticbilateral intra-articular knee injections Possible bilateral intra-articular Hyalgan knee injection Diagnostic bilateral genicular nerve block Possible bilateral genicular nerve RFA   Palliative PRN treatment(s): Palliative/therapeuticbilateral L5 transforaminal ESI#3under fluoroscopic guidance and IV sedation        Recent Visits Date Type Provider Dept  08/10/19 Telemedicine Milinda Pointer, MD [facility]  Showing recent visits within past 90 days and meeting all other requirements   Today's Visits Date Type Provider Dept  09/21/19 Telemedicine Milinda Pointer, MD [facility]  Showing today's visits and meeting all other requirements   Future Appointments No visits were found meeting these conditions.  Showing future appointments within next 90 days and meeting all other requirements   I discussed the assessment and treatment plan with the patient. The patient was provided an opportunity to ask questions and all were answered. The patient agreed with the plan and demonstrated an understanding of the instructions.  Patient advised to call back or seek an in-person evaluation if the symptoms or condition worsens.  Duration of encounter: 12 minutes.  Note by: Gaspar Cola, MD Date: 09/21/2019; Time: 8:18 AM

## 2019-09-20 NOTE — Telephone Encounter (Signed)
Attempted to call patient. No answer, left message on AM to call us related to 3/17 visit.

## 2019-09-21 ENCOUNTER — Other Ambulatory Visit: Payer: Self-pay

## 2019-09-21 ENCOUNTER — Ambulatory Visit: Payer: Managed Care, Other (non HMO) | Attending: Pain Medicine | Admitting: Pain Medicine

## 2019-09-21 ENCOUNTER — Telehealth: Payer: Self-pay | Admitting: *Deleted

## 2019-09-21 DIAGNOSIS — M25561 Pain in right knee: Secondary | ICD-10-CM

## 2019-09-21 DIAGNOSIS — M79604 Pain in right leg: Secondary | ICD-10-CM | POA: Diagnosis not present

## 2019-09-21 DIAGNOSIS — M159 Polyosteoarthritis, unspecified: Secondary | ICD-10-CM

## 2019-09-21 DIAGNOSIS — G894 Chronic pain syndrome: Secondary | ICD-10-CM | POA: Diagnosis not present

## 2019-09-21 DIAGNOSIS — M8949 Other hypertrophic osteoarthropathy, multiple sites: Secondary | ICD-10-CM

## 2019-09-21 DIAGNOSIS — M5442 Lumbago with sciatica, left side: Secondary | ICD-10-CM

## 2019-09-21 DIAGNOSIS — M899 Disorder of bone, unspecified: Secondary | ICD-10-CM

## 2019-09-21 DIAGNOSIS — Z789 Other specified health status: Secondary | ICD-10-CM

## 2019-09-21 DIAGNOSIS — M79605 Pain in left leg: Secondary | ICD-10-CM

## 2019-09-21 DIAGNOSIS — M5441 Lumbago with sciatica, right side: Secondary | ICD-10-CM

## 2019-09-21 DIAGNOSIS — M25551 Pain in right hip: Secondary | ICD-10-CM | POA: Diagnosis not present

## 2019-09-21 DIAGNOSIS — M792 Neuralgia and neuritis, unspecified: Secondary | ICD-10-CM

## 2019-09-21 DIAGNOSIS — M25562 Pain in left knee: Secondary | ICD-10-CM

## 2019-09-21 DIAGNOSIS — M7918 Myalgia, other site: Secondary | ICD-10-CM

## 2019-09-21 DIAGNOSIS — M47816 Spondylosis without myelopathy or radiculopathy, lumbar region: Secondary | ICD-10-CM

## 2019-09-21 DIAGNOSIS — Z79899 Other long term (current) drug therapy: Secondary | ICD-10-CM

## 2019-09-21 DIAGNOSIS — E559 Vitamin D deficiency, unspecified: Secondary | ICD-10-CM

## 2019-09-21 DIAGNOSIS — M150 Primary generalized (osteo)arthritis: Secondary | ICD-10-CM

## 2019-09-21 DIAGNOSIS — G8929 Other chronic pain: Secondary | ICD-10-CM

## 2019-09-21 DIAGNOSIS — M25552 Pain in left hip: Secondary | ICD-10-CM

## 2019-09-21 MED ORDER — MELOXICAM 15 MG PO TABS: 15.0000 mg | ORAL_TABLET | Freq: Every day | ORAL | 1 refills | Status: DC

## 2019-09-21 MED ORDER — TIZANIDINE HCL 4 MG PO TABS: 4.0000 mg | ORAL_TABLET | Freq: Three times a day (TID) | ORAL | 5 refills | Status: DC | PRN

## 2019-09-21 MED ORDER — TRAMADOL HCL 50 MG PO TABS: 50.0000 mg | ORAL_TABLET | Freq: Two times a day (BID) | ORAL | 5 refills | Status: DC | PRN

## 2019-09-21 MED ORDER — PREGABALIN 75 MG PO CAPS: 75.0000 mg | ORAL_CAPSULE | Freq: Three times a day (TID) | ORAL | 5 refills | Status: DC

## 2019-11-02 ENCOUNTER — Other Ambulatory Visit: Payer: Self-pay | Admitting: Family Medicine

## 2019-11-02 DIAGNOSIS — G4700 Insomnia, unspecified: Secondary | ICD-10-CM

## 2019-11-02 DIAGNOSIS — F331 Major depressive disorder, recurrent, moderate: Secondary | ICD-10-CM

## 2019-11-02 MED ORDER — DULOXETINE HCL 60 MG PO CPEP: 60.0000 mg | ORAL_CAPSULE | Freq: Every day | ORAL | 0 refills | Status: DC

## 2019-11-02 NOTE — Telephone Encounter (Signed)
Medication Refill - Medication:  eszopiclone (LUNESTA) 2 MG TABS tablet    DULoxetine (CYMBALTA) 60 MG capsule  Has the patient contacted their pharmacy? No. (Agent: If no, request that the patient contact the pharmacy for the refill.) (Agent: If yes, when and what did the pharmacy advise?)  Preferred Pharmacy (with phone number or street name): [facility] [place] [id number] [place], [place] - [address] AT [facility] [address]  [address] [place], [place] [address]  Phone:  [phone] Fax:  [phone]   Agent: Please be advised that RX refills may take up to 3 business days. We ask that you follow-up with your pharmacy.

## 2019-11-02 NOTE — Telephone Encounter (Signed)
Requested medication (s) are due for refill today: yes  Requested medication (s) are on the active medication list: yes  Last refill:  08/05/2019  Future visit scheduled: no  Notes to clinic:  this refill cannot be delegated    Requested Prescriptions  Pending Prescriptions Disp Refills   eszopiclone (LUNESTA) 2 MG TABS tablet 90 tablet 0    Sig: Take 1 tablet (2 mg total) by mouth at bedtime. Take immediately before bedtime      Not Delegated - Psychiatry:  Anxiolytics/Hypnotics Failed - 11/02/2019  8:59 AM      Failed - This refill cannot be delegated      Failed - Urine Drug Screen completed in last 360 days.      Failed - Valid encounter within last 6 months    Recent Outpatient Visits           2 months ago Seronegative rheumatoid arthritis [facility])   [facility] Alba [facility], MD   6 months ago Seronegative rheumatoid arthritis [facility])   [facility] Al[facility] Cory, MD   11 months ago Well woman exam   [facility], MD   1 year ago Seronegative rheumatoid arthritis [facility])   [facility] Alba Cory, MD   1 year ago Depression, major, single episode, moderate [facility])   [facility] [place], Danna Hefty, MD       Future Appointments             In 4 months Delano Metz, MD [facility]             Signed Prescriptions Disp Refills   DULoxetine (CYMBALTA) 60 MG capsule 90 capsule 0    Sig: Take 1 capsule (60 mg total) by mouth daily.      Psychiatry: Antidepressants - SNRI Failed - 11/02/2019  8:59 AM      Failed - Valid encounter within last 6 months    Recent Outpatient Visits           2 months ago Seronegative rheumatoid arthritis [facility])   [facility] Alba Cory, MD   6 months ago Seronegative rheumatoid arthritis [facility])   [facility] Alba Cory, MD   11 months ago Well woman exam   [facility] Alba Cory, MD   1 year ago Seronegative rheumatoid arthritis [facility])   [facility], MD   1 year ago Depression, major, single episode, moderate [facility])   [facility] Alba Cory, MD       Future Appointments             In 4 months Delano Metz, MD [facility]            Passed - Completed PHQ-2 or PHQ-9 in the last 360 days.      Passed - Last BP in normal range    BP Readings from Last 1 Encounters:  05/04/19 132/82

## 2019-11-03 ENCOUNTER — Encounter: Payer: Self-pay | Admitting: Family Medicine

## 2019-11-03 ENCOUNTER — Ambulatory Visit (INDEPENDENT_AMBULATORY_CARE_PROVIDER_SITE_OTHER): Payer: Managed Care, Other (non HMO) | Admitting: Family Medicine

## 2019-11-03 ENCOUNTER — Other Ambulatory Visit: Payer: Self-pay

## 2019-11-03 DIAGNOSIS — M545 Low back pain, unspecified: Secondary | ICD-10-CM

## 2019-11-03 DIAGNOSIS — M0600 Rheumatoid arthritis without rheumatoid factor, unspecified site: Secondary | ICD-10-CM | POA: Diagnosis not present

## 2019-11-03 DIAGNOSIS — G4700 Insomnia, unspecified: Secondary | ICD-10-CM

## 2019-11-03 DIAGNOSIS — F419 Anxiety disorder, unspecified: Secondary | ICD-10-CM

## 2019-11-03 DIAGNOSIS — K743 Primary biliary cirrhosis: Secondary | ICD-10-CM | POA: Insufficient documentation

## 2019-11-03 DIAGNOSIS — F331 Major depressive disorder, recurrent, moderate: Secondary | ICD-10-CM

## 2019-11-03 DIAGNOSIS — R030 Elevated blood-pressure reading, without diagnosis of hypertension: Secondary | ICD-10-CM

## 2019-11-03 DIAGNOSIS — M2550 Pain in unspecified joint: Secondary | ICD-10-CM

## 2019-11-03 DIAGNOSIS — N951 Menopausal and female climacteric states: Secondary | ICD-10-CM

## 2019-11-03 DIAGNOSIS — L940 Localized scleroderma [morphea]: Secondary | ICD-10-CM

## 2019-11-03 DIAGNOSIS — G8929 Other chronic pain: Secondary | ICD-10-CM

## 2019-11-03 MED ORDER — DULOXETINE HCL 60 MG PO CPEP: 60.0000 mg | ORAL_CAPSULE | Freq: Every day | ORAL | 1 refills | Status: DC

## 2019-11-03 MED ORDER — ESZOPICLONE 2 MG PO TABS: 2.0000 mg | ORAL_TABLET | Freq: Every day | ORAL | 0 refills | Status: DC

## 2019-11-03 NOTE — Progress Notes (Signed)
Name: Candice Guzman   MRN: [id number]    DOB: [birth year]   Date:11/03/2019       Progress Note  Subjective  Chief Complaint  Chief Complaint  Patient presents with  . Medication Refill    for sleep    I connected with  [name] on 11/03/19 at 11:00 AM EDT by telephone and verified that I am speaking with the correct person using two identifiers.  I discussed the limitations, risks, security and privacy concerns of performing an evaluation and management service by telephone and the availability of in person appointments. Staff also discussed with the patient that there may be a patient responsible charge related to this service. Patient Location: at home  Provider Location: [facility]   HPI  SeronegativeRheumatoidArthritis: She has been off Simponi for years because insurance stopped paying for it.She is back on Humiraand since [id number] off methotrexate because of hair loss. She is still under the care of Dr. Gavin Potters.She states last flare was March and had to take another round of prednisone   Insomnia: able to sleep with Ambien CR , able to fall and stay asleep with medication.Medication is not covered ,she is now on Lunesta 2 mg able to fall asleep better but still wakes up during the night because of pain, but able to fall back asleep.She states still not as good as Ambien, but good enough and does not want to pay the higher cost for medication   Major Depression:she tried Effexor and Duloxetine, but it did not seem to improve depression symptoms. She states mood is affected by constant pain. She denies suicidal thoughts or ideation.Always tired, we tried switching to Sertraline on her last visit but she was unable to tolerate medication and it caused her to feel angry, she called back and resumed Duloxetine ,we tried Seroquel at night but caused her to not rest and had weird dreams. Lunesta helps with sleep . Phq 9 is stable at 8, and she attributes a lot of her  mood to her chronic pain   Menopause: she is now on ClonidineBIDshe is also using a topical drysol on axillar - prescribed by Dermatologist. Doing well now, hot flashes has resolved, she had stopped drysol but thinks since it is getting hotter she will resume medication   Elevated BP: bp was going up to 170's while at the dentist earlier this year, she has not been monitoring her bp   Back pain: she states she has a constant lower back tightness ,she saw Dr. Joanna Puff for back injections for DDD but insurance denied any more coverage.Still takes Tramadol prn, off gabapentin because it cause worsening of hot flashes, she was advised to take Lyrica but the dose was increased to 75 mg TID but she is only able to tolerate 50 mg at most twice a day. The medication seems to help with aching on her legs, but caused weight gain and feet swelling/pain and it didn't help her sleep any better at night.   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 She states fingers got purple and painful when she went outside, but pain resolved once she warmed it up  Patient Active Problem List   Diagnosis Date Noted  . Pain in joint of right shoulder 01/18/2019  . Stiffness of right shoulder joint 01/18/2019  . Muscle weakness 01/18/2019  . Osteoarthritis involving multiple joints 12/07/2018  . Moderate episode of recurrent major depressive  disorder (HCC) 08/27/2018  . Foot drop (Right) 08/04/2018  . Abnormal MRI, lumbar spine (06/21/2018) 08/04/2018  . Chronic lumbar radicular pain (L5 dermatome) (Bilateral) (R>L) 05/26/2018  . Long term prescription benzodiazepine use 09/21/2017  . Chronic lower extremity pain (Primary Area of Pain) (Bilateral) (R>L) 09/21/2017  . Chronic low back pain (Secondary Area of Pain) (Bilateral) (R>L) 09/21/2017  . Pharmacologic therapy 09/21/2017  . Problems influencing health status 09/21/2017  .  Spondylosis without myelopathy or radiculopathy, lumbosacral region 09/21/2017  . Chronic hip pain [facility] Pain) (Bilateral) (R>L) 09/21/2017  . Chronic shoulder pain (Bilateral) (L>R) 09/21/2017  . Grade 1 Anterolisthesis of L5 over S1 & L3 over L4 09/21/2017  . Grade 1 Retrolisthesis L1 over L2 & L2 over L3 09/21/2017  . DDD (degenerative disc disease), lumbar 09/21/2017  . 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 Lumbar facet joint syndrome (Bilateral) (R>L) 09/21/2017  . Chronic musculoskeletal pain 09/21/2017  . Neurogenic pain 09/21/2017  . Lumbar foraminal stenosis (L5-S1) (Bilateral) 09/21/2017  . Lumbar lateral recess stenosis (Bilateral) (B: L4-5) (L>R: L3-4) 09/21/2017  . Lumbar facet hypertrophy (Bilateral: L3-4, L4-5, and L5-S1) 09/21/2017  . Chronic pain syndrome 07/08/2017  . Chronic knee pain (Fourth Area of Pain) (Bilateral) 07/08/2017  . Chronic wrist pain (Fifth Area of Pain) (Bilateral) 07/08/2017  . Disorder of skeletal system 07/08/2017  . Other long term (current) drug therapy 07/08/2017  . Long term current use of opiate analgesic 07/08/2017  . Other specified health status 07/08/2017  . Chronic pain of multiple joints 05/20/2017  . Insomnia, persistent 12/18/2014  . Depression, major, single episode, moderate (HCC) 12/18/2014  . Menopausal symptom 12/18/2014  . Vitamin D insufficiency 12/18/2014  . Inflammatory arthritis (HCC) 02/23/2014  . Lumbosacral radiculopathy (L5) (Right) 12/12/2013  . Bulge of lumbar disc without myelopathy 06/18/2011    Past Surgical History:  Procedure Laterality Date  . BREAST BIOPSY Left 2010   neg  . CESAREAN SECTION    . FINGER ARTHROPLASTY Left 07/24/2017   Thumb  . GANGLION CYST EXCISION    . METACARPOPHALANGEAL JOINT ARTHROPLASTY Right 06/06/2016   Dr. Dayna Barker   . SHOULDER ARTHROSCOPY WITH ROTATOR CUFF REPAIR Right 02/24/2019   Procedure: RIGHT SHOULDER ARTHROSCOPY WITH  SUBACROMIAL DECOMPRESSION, DISTAL CLAVICLE EXCISION, LYSIS OF ADHESIONS,CAPSULOTOMY,MINI OPEN ROTATOR CUFF REPAIR;  Surgeon: Juanell Fairly, MD;  Location: [facility] ORS;  Service: Orthopedics;  Laterality: Right;    Family History  Problem Relation Age of Onset  . Emphysema Mother   . Cancer Father 60       lung  . Cancer Brother        tongue    Social History   Socioeconomic History  . Marital status: Divorced    Spouse name: Not on file  . Number of children: 1  . Years of education: Not on file  . Highest education level: High school graduate  Occupational History  . Not on file  Tobacco Use  . Smoking status: Never Smoker  . Smokeless tobacco: Never Used  Substance and Sexual Activity  . Alcohol use: No    Alcohol/week: 0.0 standard drinks  . Drug use: No  . Sexual activity: Yes    Partners: Male  Other Topics Concern  . Not on file  Social History Narrative  . Not on file   Social Determinants of Health   Financial Resource Strain:   . Difficulty of Paying Living Expenses:   Food Insecurity:   . Worried  About Running Out of Food in the Last Year:   . Ran Out of Food in the Last Year:   Transportation Needs:   . Lack of Transportation (Medical):   Marland Kitchen Lack of Transportation (Non-Medical):   Physical Activity:   . Days of Exercise per Week:   . Minutes of Exercise per Session:   Stress:   . Feeling of Stress :   Social Connections:   . Frequency of Communication with Friends and Family:   . Frequency of Social Gatherings with Friends and Family:   . Attends Religious Services:   . Active Member of Clubs or Organizations:   . Attends Banker Meetings:   Marland Kitchen Marital Status:   Intimate Partner Violence:   . Fear of Current or Ex-Partner:   . Emotionally Abused:   Marland Kitchen Physically Abused:   . Sexually Abused:      Current Outpatient Medications:  .  ALPRAZolam (XANAX) 0.5 MG tablet, TAKE 1 TABLET(0.5 MG) BY MOUTH AT BEDTIME AS NEEDED, Disp: 30  tablet, Rfl: 0 .  cloNIDine (CATAPRES) 0.1 MG tablet, TAKE 1 TABLET BY MOUTH  TWICE DAILY, Disp: 180 tablet, Rfl: 3 .  DULoxetine (CYMBALTA) 60 MG capsule, Take 1 capsule (60 mg total) by mouth daily., Disp: 90 capsule, Rfl: 0 .  eszopiclone (LUNESTA) 2 MG TABS tablet, Take 1 tablet (2 mg total) by mouth at bedtime. Take immediately before bedtime, Disp: 90 tablet, Rfl: 0 .  HUMIRA PEN 40 MG/0.8ML PNKT, Inject 40 mg into the skin every 14 (fourteen) days. , Disp: , Rfl: 3 .  meloxicam (MOBIC) 15 MG tablet, Take 1 tablet (15 mg total) by mouth daily with breakfast. Must last 30 days, Disp: 90 tablet, Rfl: 1 .  pregabalin (LYRICA) 75 MG capsule, Take 1 capsule (75 mg total) by mouth 3 (three) times daily., Disp: 90 capsule, Rfl: 5 .  tiZANidine (ZANAFLEX) 4 MG tablet, Take 1 tablet (4 mg total) by mouth every 8 (eight) hours as needed for muscle spasms. Must last 30 days, Disp: 90 tablet, Rfl: 5 .  traMADol (ULTRAM) 50 MG tablet, Take 1 tablet (50 mg total) by mouth every 12 (twelve) hours as needed for severe pain. Must last 30 days, Disp: 60 tablet, Rfl: 5  Allergies  Allergen Reactions  . Codeine Itching  . Methotrexate Nausea Only    I personally reviewed active problem list, medication list, allergies, family history, social history with the patient/caregiver today.   ROS  Ten systems reviewed and is negative except as mentioned in HPI   Objective  Virtual encounter, vitals not obtained.  There is no height or weight on file to calculate BMI.  Physical Exam  Awake, alert and oriented   PHQ2/9: Depression screen [facility] 2/9 11/03/2019 08/05/2019 05/04/2019 11/26/2018 08/27/2018  Decreased Interest 1 2 1 1 3   Down, Depressed, Hopeless 1 1 2 2 3   PHQ - 2 Score 2 3 3 3 6   Altered sleeping 3 3 1 2 3   Tired, decreased energy 1 2 3 1 3   Change in appetite 1 0 2 1 1   Feeling bad or failure about yourself  0 0 1 0 0  Trouble concentrating 1 0 1 0 1  Moving slowly or fidgety/restless 0 0  0 0 1  Suicidal thoughts 0 0 0 0 1  PHQ-9 Score 8 8 11 7 16   Difficult doing work/chores Somewhat difficult Not difficult at all Somewhat difficult Somewhat difficult Extremely dIfficult  Some recent data might  be hidden   PHQ-2/9 Result is positive.    Fall Risk: Fall Risk  11/03/2019 08/05/2019 05/04/2019 11/26/2018 08/27/2018  Falls in the past year? 0 0 1 0 0  Comment - - - - -  Number falls in past yr: 0 0 1 0 0  Injury with Fall? 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 Depression, major, recurrent, moderate (HCC)  - DULoxetine (CYMBALTA) 60 MG capsule; Take 1 capsule (60 mg total) by mouth daily.  Dispense: 90 capsule; Refill: 1  3. Insomnia, persistent  - eszopiclone (LUNESTA) 2 MG TABS tablet; Take 1 tablet (2 mg total) by mouth at bedtime. Take immediately before bedtime  Dispense: 90 tablet; Refill: 0  4. Anxiety  She will let me know she needs a refill of alprazolam, still has 7 pills left, she only takes it prn when unable to sleep   5. Seronegative rheumatoid arthritis ([place])  Keep follow up with rheumatologist  6. Chronic pain of multiple joints   7. Chronic low back pain without sciatica, unspecified back pain laterality  Under the care of Dr. Wynona Canes  8. Menopausal symptom  stable  9. Elevated BP without diagnosis of hypertension  She will return in one - two weeks for bp check Also reminded her she is due for labs and needs mammogram   I discussed the assessment and treatment plan with the patient. The patient was provided an opportunity to ask questions and all were answered. The patient agreed with the plan and demonstrated an understanding of the instructions.   The patient was advised to call back or seek an in-person  evaluation if the symptoms worsen or if the condition fails to improve as anticipated.  I provided 25 minutes of non-face-to-face time during this encounter.  Loistine Chance, MD

## 2019-11-03 NOTE — Telephone Encounter (Signed)
Virtual appt scheduled with Dr Carlynn Purl for today

## 2019-11-04 ENCOUNTER — Ambulatory Visit: Payer: Managed Care, Other (non HMO) | Admitting: Family Medicine

## 2020-01-27 ENCOUNTER — Other Ambulatory Visit: Payer: Self-pay | Admitting: Pain Medicine

## 2020-01-27 DIAGNOSIS — G894 Chronic pain syndrome: Secondary | ICD-10-CM

## 2020-02-06 ENCOUNTER — Telehealth: Payer: Self-pay | Admitting: Family Medicine

## 2020-02-06 DIAGNOSIS — G4700 Insomnia, unspecified: Secondary | ICD-10-CM

## 2020-02-06 DIAGNOSIS — F419 Anxiety disorder, unspecified: Secondary | ICD-10-CM

## 2020-02-06 NOTE — Telephone Encounter (Signed)
Pt last seen dr Carlynn Purl 11/03/2019. Pt has contacted her pharm last week. Pt needs a refill on generic lunesta and alprazolam. [facility] [address] in [place]

## 2020-02-06 NOTE — Telephone Encounter (Signed)
Requested medication (s) are due for refill today: yes to both  Requested medication (s) are on the active medication list: yes   Last refill:  alprazolam: 06/05/19          Lunesta: 11/03/19  Future visit scheduled: no  Notes to clinic:  medication not delegated to NT to RF   Requested Prescriptions  Pending Prescriptions Disp Refills   ALPRAZolam (XANAX) 0.5 MG tablet 30 tablet 0      Not Delegated - Psychiatry:  Anxiolytics/Hypnotics Failed - 02/06/2020  1:41 PM      Failed - This refill cannot be delegated      Failed - Urine Drug Screen completed in last 360 days.      Passed - Valid encounter within last 6 months    Recent Outpatient Visits           3 months ago Seronegative rheumatoid arthritis [facility])   [facility] Alba Cory, MD   6 months ago Seronegative rheumatoid arthritis [facility])   [facility] Alba Cory, MD   9 months ago Seronegative rheumatoid arthritis [facility])   [facility] Alba Cory, MD   1 year ago Well woman exam   [facility], Danna Hefty, MD   1 year ago Seronegative rheumatoid arthritis [facility])   [facility] Alba Cory, MD                eszopiclone (LUNESTA) 2 MG TABS tablet 90 tablet 0    Sig: Take 1 tablet (2 mg total) by mouth at bedtime. Take immediately before bedtime      Not Delegated - Psychiatry:  Anxiolytics/Hypnotics Failed - 02/06/2020  1:41 PM      Failed - This refill cannot be delegated      Failed - Urine Drug Screen completed in last 360 days.      Passed - Valid encounter within last 6 months    Recent Outpatient Visits           3 months ago Seronegative rheumatoid arthritis [facility])   [facility] Alba Cory, MD   6 months ago Seronegative rheumatoid arthritis [facility])   [facility] Alba Cory, MD   9 months ago Seronegative rheumatoid arthritis [facility])   [facility] Alba Cory, MD   1 year ago Well woman exam   [facility] Alba Cory, MD   1 year ago Seronegative rheumatoid arthritis [facility])   [facility] Alba Cory, MD

## 2020-02-08 ENCOUNTER — Ambulatory Visit: Payer: Self-pay

## 2020-02-08 NOTE — Telephone Encounter (Signed)
Patient called stating that she was stung multiple times back of rt lef and on her foot by a hornet last evening.  She states 5 times. She states that she has lump on back of calf about the size of a softball. She has treated symptoms with benadryl and ice. She has had reaction to sting by hornet in past.  She states that pain is moderate but the itch is severe. She states that area is red on her foot. She states she has had chills but did not check for fever.  She has no red streaks running up her leg. She has no facial swelling or difficulty breathing. Care advice read to patient.  She verbalized understanding of all information. My chart visit tomorrow.  Reason for Disposition . Swelling is huge (e.g., > 4 inches or 10 cm, spreads beyond wrist or ankle)  Answer Assessment - Initial Assessment Questions 1. TYPE: "What type of sting was it?" (bee, yellow jacket, etc.)      Hornet sting 2. ONSET: "When did it occur?"     Last evening around 7 pm 3. LOCATION: "Where is the sting located?"  "How many stings?"     Rt leg 4. SWELLING SIZE: "How big is the swelling?" (e.g., inches or cm)     extreme swelling in calf 5. REDNESS: "Is the area red or pink?" If Yes, ask: "What size is area of redness?" (e.g., inches or cm). "When did the redness start?"     Red around inside of foot and top of foot 6. PAIN: "Is there any pain?" If Yes, ask: "How bad is it?"  (Scale 1-10; or mild, moderate, severe)    4-5 7. ITCHING: "Is there any itching?" If Yes, ask: "How bad is it?"     Extreme itching 8. RESPIRATORY DISTRESS: "Describe your breathing."     no 9. PRIOR REACTIONS: "Have you had any severe allergic reactions to stings in the past?" if yes, ask: "What happened?"    Yes from hornet severe swelling and pain 10. OTHER SYMPTOMS: "Do you have any other symptoms?" (e.g., abdominal pain, face or tongue swelling, new rash elsewhere, vomiting)       none 11. PREGNANCY: "Is there any chance you are pregnant?"  "When was your last menstrual period?"       N/A  Protocols used: BEE OR YELLOW JACKET STING-A-AH

## 2020-02-09 ENCOUNTER — Encounter: Payer: Managed Care, Other (non HMO) | Admitting: Internal Medicine

## 2020-02-09 ENCOUNTER — Telehealth: Payer: Self-pay

## 2020-02-09 NOTE — Telephone Encounter (Signed)
I called the patient to triage her for her 2:00 appointment and she stays she soaked her stings in epsom salt and put toothpaste on them and they are much improved. She would like to cancel her appointment for today and she will call back if necessary.

## 2020-02-09 NOTE — Telephone Encounter (Signed)
Pt called in and needs refills on her  Alprazolam  DULoxetine (CYMBALTA) 60 MG capsule [39277] eszopiclone (LUNESTA) 2 MG TABS tablet   [id number] [place], [place] - [address] AT [facility] OF [address]  [address] [place], [place] [address]  Phone:  [phone] Fax:  [phone]  DEA #:  [id number]

## 2020-02-10 NOTE — Progress Notes (Signed)
Error, no visit

## 2020-02-15 ENCOUNTER — Encounter: Payer: Self-pay | Admitting: Family Medicine

## 2020-02-15 ENCOUNTER — Other Ambulatory Visit: Payer: Self-pay

## 2020-02-15 ENCOUNTER — Ambulatory Visit: Payer: Managed Care, Other (non HMO) | Admitting: Family Medicine

## 2020-02-15 VITALS — BP 122/74 | HR 86 | Temp 98.9°F | Resp 16 | Ht 64.0 in | Wt 143.8 lb

## 2020-02-15 DIAGNOSIS — M25512 Pain in left shoulder: Secondary | ICD-10-CM

## 2020-02-15 DIAGNOSIS — F419 Anxiety disorder, unspecified: Secondary | ICD-10-CM

## 2020-02-15 DIAGNOSIS — G4700 Insomnia, unspecified: Secondary | ICD-10-CM

## 2020-02-15 DIAGNOSIS — F331 Major depressive disorder, recurrent, moderate: Secondary | ICD-10-CM

## 2020-02-15 DIAGNOSIS — M0600 Rheumatoid arthritis without rheumatoid factor, unspecified site: Secondary | ICD-10-CM

## 2020-02-15 DIAGNOSIS — G894 Chronic pain syndrome: Secondary | ICD-10-CM | POA: Diagnosis not present

## 2020-02-15 MED ORDER — ALPRAZOLAM 0.5 MG PO TABS: ORAL_TABLET | ORAL | 0 refills | Status: DC

## 2020-02-15 MED ORDER — PREDNISONE 10 MG (21) PO TBPK: ORAL_TABLET | ORAL | 0 refills | Status: DC

## 2020-02-15 MED ORDER — DULOXETINE HCL 60 MG PO CPEP: 60.0000 mg | ORAL_CAPSULE | Freq: Every day | ORAL | 0 refills | Status: DC

## 2020-02-15 MED ORDER — ESZOPICLONE 2 MG PO TABS: 2.0000 mg | ORAL_TABLET | Freq: Every day | ORAL | 0 refills | Status: DC

## 2020-02-15 NOTE — Progress Notes (Signed)
Name: Candice Guzman   MRN: [id number]    DOB: 12-25-1960   Date:02/15/2020       Progress Note  Chief Complaint  Patient presents with  . Depression    medication refills  . Insomnia  . Anxiety     Subjective:   Candice Guzman is a 59 y.o. female, presents to clinic for med refills and f/up (PCP unavailable)  Insomnia - pt has been on lunesta for the last year or so  She states she also uses xanax prn to help with severe sleeplessness/insomnia She presents for refills today, her PCP was not available She tries to go to bed at 8 or 9 pm, she wakes up frequently - at least hourly, even with lunesta she will only get 1-2 hours of sleep before waking up Without meds she doesn't sleep at all and reports no sleep for nearly the last 1-2 weeks since out of meds  Cymbalta for pain and anxiety/depression - she has been out for 1.5 weeks - she noticed some worsening of her chronic pain  She sees Dr. Laban Emperor for chronic pain management - on tramadol, pregabalin - dose increased to 75, and she hasn't been taking because of SE.  She also take muscle relaxers and cymbalta  She sees Dr. Gavin Potters and is on humira for RA  PHQ - positive score - she feels r/t chronic conditions, pain, being out of meds, chronic insomnia and fatigue Depression screen [facility] 2/9 02/15/2020 11/03/2019 08/05/2019  Decreased Interest 3 1 2   Down, Depressed, Hopeless 3 1 1   PHQ - 2 Score 6 2 3   Altered sleeping 3 3 3   Tired, decreased energy 3 1 2   Change in appetite 0 1 0  Feeling bad or failure about yourself  0 0 0  Trouble concentrating 0 1 0  Moving slowly or fidgety/restless 0 0 0  Suicidal thoughts 0 0 0  PHQ-9 Score 12 8 8   Difficult doing work/chores Somewhat difficult Somewhat difficult Not difficult at all  Some recent data might be hidden   She has had some left shoulder pain flares/RA flares - flared up again today, several flares in the past year She has called [facility] to get  steroid bursts - she feels like she needs again, no steroids for more than a month or two   Currently managed on clonidine for hot flashes - she is taking 0.1 mg BID - no sx or SE    Current Outpatient Medications:  .  ALPRAZolam (XANAX) 0.5 MG tablet, TAKE 1 TABLET(0.5 MG) BY MOUTH AT BEDTIME AS NEEDED, Disp: 30 tablet, Rfl: 0 .  cloNIDine (CATAPRES) 0.1 MG tablet, TAKE 1 TABLET BY MOUTH  TWICE DAILY, Disp: 180 tablet, Rfl: 3 .  DULoxetine (CYMBALTA) 60 MG capsule, Take 1 capsule (60 mg total) by mouth daily., Disp: 90 capsule, Rfl: 1 .  eszopiclone (LUNESTA) 2 MG TABS tablet, Take 1 tablet (2 mg total) by mouth at bedtime. Take immediately before bedtime, Disp: 90 tablet, Rfl: 0 .  HUMIRA PEN 40 MG/0.8ML PNKT, Inject 40 mg into the skin every 14 (fourteen) days. , Disp: , Rfl: 3 .  meloxicam (MOBIC) 15 MG tablet, Take 1 tablet (15 mg total) by mouth daily with breakfast. Must last 30 days, Disp: 90 tablet, Rfl: 1 .  pregabalin (LYRICA) 75 MG capsule, Take 1 capsule (75 mg total) by mouth 3 (three) times daily., Disp: 90 capsule, Rfl: 5 .  tiZANidine (ZANAFLEX) 4 MG tablet,  Take 1 tablet (4 mg total) by mouth every 8 (eight) hours as needed for muscle spasms. Must last 30 days, Disp: 90 tablet, Rfl: 5 .  traMADol (ULTRAM) 50 MG tablet, Take 1 tablet (50 mg total) by mouth every 12 (twelve) hours as needed for severe pain. Must last 30 days, Disp: 60 tablet, Rfl: 5  Patient Active Problem List   Diagnosis Date Noted  . 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 Pain in joint of right shoulder 01/18/2019  . Stiffness of right shoulder joint 01/18/2019  . Muscle weakness 01/18/2019  . Osteoarthritis involving multiple joints 12/07/2018  . Moderate episode of recurrent major depressive disorder (HCC) 08/27/2018  . Foot drop (Right) 08/04/2018  . Abnormal MRI, lumbar spine (06/21/2018) 08/04/2018  . Chronic lumbar radicular pain (L5 dermatome) (Bilateral) (R>L) 05/26/2018  . Long term  prescription benzodiazepine use 09/21/2017  . Chronic lower extremity pain (Primary Area of Pain) (Bilateral) (R>L) 09/21/2017  . Chronic low back pain (Secondary Area of Pain) (Bilateral) (R>L) 09/21/2017  . Pharmacologic therapy 09/21/2017  . Problems influencing health status 09/21/2017  . Spondylosis without myelopathy or radiculopathy, lumbosacral region 09/21/2017  . Chronic hip pain [facility] Area of Pain) (Bilateral) (R>L) 09/21/2017  . Chronic shoulder pain (Bilateral) (L>R) 09/21/2017  . Grade 1 Anterolisthesis of L5 over S1 & L3 over L4 09/21/2017  . Grade 1 Retrolisthesis L1 over L2 & L2 over L3 09/21/2017  . DDD (degenerative disc disease), lumbar 09/21/2017  . 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 Lumbar facet joint syndrome (Bilateral) (R>L) 09/21/2017  . Chronic musculoskeletal pain 09/21/2017  . Neurogenic pain 09/21/2017  . Lumbar foraminal stenosis (L5-S1) (Bilateral) 09/21/2017  . Lumbar lateral recess stenosis (Bilateral) (B: L4-5) (L>R: L3-4) 09/21/2017  . Lumbar facet hypertrophy (Bilateral: L3-4, L4-5, and L5-S1) 09/21/2017  . Chronic pain syndrome 07/08/2017  . Chronic knee pain (Fourth Area of Pain) (Bilateral) 07/08/2017  . Chronic wrist pain (Fifth Area of Pain) (Bilateral) 07/08/2017  . Disorder of skeletal system 07/08/2017  . Other long term (current) drug therapy 07/08/2017  . Long term current use of opiate analgesic 07/08/2017  . Other specified health status 07/08/2017  . Chronic pain of multiple joints 05/20/2017  . Insomnia, persistent 12/18/2014  . Depression, major, single episode, moderate (HCC) 12/18/2014  . Menopausal symptom 12/18/2014  . Vitamin D insufficiency 12/18/2014  . Inflammatory arthritis (HCC) 02/23/2014  . Lumbosacral radiculopathy (L5) (Right) 12/12/2013  . Bulge of lumbar disc without myelopathy 06/18/2011    Past Surgical History:  Procedure Laterality Date  . BREAST BIOPSY Left 2010    neg  . CESAREAN SECTION    . FINGER ARTHROPLASTY Left 07/24/2017   Thumb  . GANGLION CYST EXCISION    . METACARPOPHALANGEAL JOINT ARTHROPLASTY Right 06/06/2016   Dr. Dayna Barker   . SHOULDER ARTHROSCOPY WITH ROTATOR CUFF REPAIR Right 02/24/2019   Procedure: RIGHT SHOULDER ARTHROSCOPY WITH SUBACROMIAL DECOMPRESSION, DISTAL CLAVICLE EXCISION, LYSIS OF ADHESIONS,CAPSULOTOMY,MINI OPEN ROTATOR CUFF REPAIR;  Surgeon: Juanell Fairly, MD;  Location: [facility] ORS;  Service: Orthopedics;  Laterality: Right;    Family History  Problem Relation Age of Onset  . Emphysema Mother   . Cancer Father 60       lung  . Cancer Brother        tongue    Social History   Tobacco Use  . Smoking status: Never Smoker  . Smokeless tobacco: Never Used  Vaping Use  . Vaping Use: Never used  Substance  Use Topics  . Alcohol use: No    Alcohol/week: 0.0 standard drinks  . Drug use: No     Allergies  Allergen Reactions  . Codeine Itching  . Methotrexate Nausea Only    Health Maintenance  Topic Date Due  . MAMMOGRAM  01/22/2018  . INFLUENZA VACCINE  02/05/2020  . HIV Screening  08/04/2020 (Originally 05/26/1976)  . PAP SMEAR-Modifier  06/01/2020  . COLONOSCOPY  05/05/2024  . TETANUS/TDAP  12/17/2024  . Hepatitis C Screening  Completed    Chart Review Today: I personally reviewed active problem list, medication list, allergies, family history, social history, health maintenance, notes from last encounter, lab results, imaging with the patient/caregiver today.  Review of Systems  10 Systems reviewed and are negative for acute change except as noted in the HPI.  Objective:   Vitals:   02/15/20 0806  BP: 122/74  Pulse: 86  Resp: 16  Temp: 98.9 F (37.2 C)  TempSrc: Oral  SpO2: 94%  Weight: 143 lb 12.8 oz (65.2 kg)  Height: 5\' 4"  (1.626 m)    Body mass index is 24.68 kg/m.  Physical Exam Vitals and nursing note reviewed.  Constitutional:      General: She is not in acute distress.     Appearance: Normal appearance. She is well-developed. She is not ill-appearing, toxic-appearing or diaphoretic.     Interventions: Face mask in place.  HENT:     Head: Normocephalic and atraumatic.     Right Ear: External ear normal.     Left Ear: External ear normal.  Eyes:     General: Lids are normal. No scleral icterus.       Right eye: No discharge.        Left eye: No discharge.     Conjunctiva/sclera: Conjunctivae normal.  Neck:     Trachea: Phonation normal. No tracheal deviation.  Cardiovascular:     Rate and Rhythm: Normal rate and regular rhythm.     Pulses: Normal pulses.          Radial pulses are 2+ on the right side and 2+ on the left side.       Posterior tibial pulses are 2+ on the right side and 2+ on the left side.     Heart sounds: Normal heart sounds. No murmur heard.  No friction rub. No gallop.   Pulmonary:     Effort: Pulmonary effort is normal. No respiratory distress.     Breath sounds: Normal breath sounds. No stridor. No wheezing, rhonchi or rales.  Chest:     Chest wall: No tenderness.  Abdominal:     General: Bowel sounds are normal. There is no distension.     Palpations: Abdomen is soft.  Musculoskeletal:     Right lower leg: No edema.     Left lower leg: No edema.  Skin:    General: Skin is warm and dry.     Coloration: Skin is not jaundiced or pale.     Findings: No rash.  Neurological:     Mental Status: She is alert.     Motor: No abnormal muscle tone.     Gait: Gait normal.  Psychiatric:        Mood and Affect: Mood normal.        Speech: Speech normal.        Behavior: Behavior normal.         Assessment & Plan:     ICD-10-CM   1. Anxiety  F41.9  ALPRAZolam (XANAX) 0.5 MG tablet   Refills on Cymbalta and as needed Xanax  2. Insomnia, persistent  G47.00 eszopiclone (LUNESTA) 2 MG TABS tablet   refills given for lunesta and xanax after reviewing PDMP and consulting with pts PCP - she will need f/up with PCP for continued  management and refills  3. Depression, major, recurrent, moderate (HCC)  F33.1 DULoxetine (CYMBALTA) 60 MG capsule   score positive - worse since she ran out of meds, she declines any additional medication or referrals at this time  4. Chronic pain syndrome  G89.4    Managed by pain management -encouraged her to communicate with Dr. Laban Emperor regarding intolerable side effects of Lyrica  5. Seronegative rheumatoid arthritis (HCC)  M06.00    Per rheumatology, current flare, is past due for follow-up with her rheumatologist encouraged her to get appointment  6. Left shoulder pain, unspecified chronicity  M25.512    flare - steroid taper - f/up with rheumatology if not improving      Return in about 3 months (around 05/17/2020) for Routine follow-up PCP .   Danelle Berry, PA-C 02/15/20 8:20 AM

## 2020-02-15 NOTE — Patient Instructions (Signed)
F/up with your PCP in the next 3 months so that she can send in your refills and get back on track

## 2020-02-24 ENCOUNTER — Other Ambulatory Visit
Admission: RE | Admit: 2020-02-24 | Discharge: 2020-02-24 | Disposition: A | Discharge status: Home / Self Care | Payer: Managed Care, Other (non HMO) | Attending: Pain Medicine | Admitting: Pain Medicine

## 2020-02-24 DIAGNOSIS — Z79899 Other long term (current) drug therapy: Secondary | ICD-10-CM | POA: Insufficient documentation

## 2020-02-24 DIAGNOSIS — G894 Chronic pain syndrome: Secondary | ICD-10-CM | POA: Insufficient documentation

## 2020-02-24 LAB — URINE DRUG SCREEN, QUALITATIVE (ARMC ONLY)
Amphetamines, Ur Screen: NOT DETECTED
Barbiturates, Ur Screen: NOT DETECTED
Benzodiazepine, Ur Scrn: NOT DETECTED
Cannabinoid 50 Ng, Ur ~~LOC~~: NOT DETECTED
Cocaine Metabolite,Ur ~~LOC~~: NOT DETECTED
MDMA (Ecstasy)Ur Screen: NOT DETECTED
Methadone Scn, Ur: NOT DETECTED
Opiate, Ur Screen: NOT DETECTED
Phencyclidine (PCP) Ur S: NOT DETECTED
Tricyclic, Ur Screen: NOT DETECTED

## 2020-02-24 LAB — COMPREHENSIVE METABOLIC PANEL
ALT: 13 U/L (ref 0–44)
AST: 17 U/L (ref 15–41)
Albumin: 3.6 g/dL (ref 3.5–5.0)
Alkaline Phosphatase: 111 U/L (ref 38–126)
Anion gap: 9 (ref 5–15)
BUN: 15 mg/dL (ref 6–20)
CO2: 29 mmol/L (ref 22–32)
Calcium: 9.1 mg/dL (ref 8.9–10.3)
Chloride: 104 mmol/L (ref 98–111)
Creatinine, Ser: 0.86 mg/dL (ref 0.44–1.00)
GFR calc Af Amer: >60 mL/min (ref >60)
GFR calc non Af Amer: >60 mL/min (ref >60)
Glucose, Bld: 111 mg/dL — ABNORMAL HIGH (ref 70–99)
Potassium: 3.9 mmol/L (ref 3.5–5.1)
Sodium: 142 mmol/L (ref 135–145)
Total Bilirubin: 0.6 mg/dL (ref 0.3–1.2)
Total Protein: 6.7 g/dL (ref 6.5–8.1)

## 2020-03-09 ENCOUNTER — Emergency Department
Admission: EM | Admit: 2020-03-09 | Discharge: 2020-03-09 | Disposition: A | Discharge status: Home / Self Care | Payer: Managed Care, Other (non HMO) | Attending: Emergency Medicine | Admitting: Emergency Medicine

## 2020-03-09 ENCOUNTER — Other Ambulatory Visit: Payer: Self-pay

## 2020-03-09 ENCOUNTER — Ambulatory Visit: Payer: Self-pay

## 2020-03-09 ENCOUNTER — Emergency Department: Payer: Managed Care, Other (non HMO)

## 2020-03-09 DIAGNOSIS — Z20822 Contact with and (suspected) exposure to covid-19: Secondary | ICD-10-CM | POA: Diagnosis not present

## 2020-03-09 DIAGNOSIS — N200 Calculus of kidney: Secondary | ICD-10-CM

## 2020-03-09 DIAGNOSIS — Z79899 Other long term (current) drug therapy: Secondary | ICD-10-CM | POA: Diagnosis not present

## 2020-03-09 DIAGNOSIS — R109 Unspecified abdominal pain: Secondary | ICD-10-CM | POA: Diagnosis present

## 2020-03-09 LAB — COMPREHENSIVE METABOLIC PANEL
ALT: 13 U/L (ref 0–44)
AST: 17 U/L (ref 15–41)
Albumin: 3.8 g/dL (ref 3.5–5.0)
Alkaline Phosphatase: 105 U/L (ref 38–126)
Anion gap: 11 (ref 5–15)
BUN: 10 mg/dL (ref 6–20)
CO2: 27 mmol/L (ref 22–32)
Calcium: 9.5 mg/dL (ref 8.9–10.3)
Chloride: 104 mmol/L (ref 98–111)
Creatinine, Ser: 0.77 mg/dL (ref 0.44–1.00)
GFR calc Af Amer: >60 mL/min (ref >60)
GFR calc non Af Amer: >60 mL/min (ref >60)
Glucose, Bld: 107 mg/dL — ABNORMAL HIGH (ref 70–99)
Potassium: 3.1 mmol/L — ABNORMAL LOW (ref 3.5–5.1)
Sodium: 142 mmol/L (ref 135–145)
Total Bilirubin: 0.5 mg/dL (ref 0.3–1.2)
Total Protein: 7.4 g/dL (ref 6.5–8.1)

## 2020-03-09 LAB — URINALYSIS, COMPLETE (UACMP) WITH MICROSCOPIC
Bilirubin Urine: NEGATIVE
Glucose, UA: NEGATIVE mg/dL
Ketones, ur: NEGATIVE mg/dL
Nitrite: NEGATIVE
Protein, ur: 30 mg/dL — AB
Specific Gravity, Urine: 1.02 (ref 1.005–1.030)
pH: 5 (ref 5.0–8.0)

## 2020-03-09 LAB — CBC
HCT: 39.4 % (ref 36.0–46.0)
Hemoglobin: 12.8 g/dL (ref 12.0–15.0)
MCH: 29 pg (ref 26.0–34.0)
MCHC: 32.5 g/dL (ref 30.0–36.0)
MCV: 89.1 fL (ref 80.0–100.0)
Platelets: 334 10*3/uL (ref 150–400)
RBC: 4.42 MIL/uL (ref 3.87–5.11)
RDW: 13.5 % (ref 11.5–15.5)
WBC: 10.6 10*3/uL — ABNORMAL HIGH (ref 4.0–10.5)
nRBC: 0 % (ref 0.0–0.2)

## 2020-03-09 LAB — SARS CORONAVIRUS 2 BY RT PCR (HOSPITAL ORDER, PERFORMED IN ~~LOC~~ HOSPITAL LAB): SARS Coronavirus 2: NEGATIVE

## 2020-03-09 LAB — LIPASE, BLOOD: Lipase: 18 U/L (ref 11–51)

## 2020-03-09 MED ORDER — SULFAMETHOXAZOLE-TRIMETHOPRIM 800-160 MG PO TABS: 1.0000 | ORAL_TABLET | Freq: Two times a day (BID) | ORAL | 0 refills | Status: DC

## 2020-03-09 MED ORDER — ONDANSETRON 4 MG PO TBDP
4.0000 mg | ORAL_TABLET | Freq: Once | ORAL | Status: AC
  Administered 2020-03-09: 4 mg via ORAL
  Filled 2020-03-09: qty 1

## 2020-03-09 MED ORDER — KETOROLAC TROMETHAMINE 10 MG PO TABS: 10.0000 mg | ORAL_TABLET | Freq: Four times a day (QID) | ORAL | 0 refills | Status: DC | PRN

## 2020-03-09 MED ORDER — TAMSULOSIN HCL 0.4 MG PO CAPS: 0.4000 mg | ORAL_CAPSULE | Freq: Every day | ORAL | 0 refills | Status: DC

## 2020-03-09 MED ORDER — KETOROLAC TROMETHAMINE 60 MG/2ML IM SOLN
60.0000 mg | Freq: Once | INTRAMUSCULAR | Status: AC
  Administered 2020-03-09: 60 mg via INTRAMUSCULAR
  Filled 2020-03-09: qty 2

## 2020-03-09 MED ORDER — SULFAMETHOXAZOLE-TRIMETHOPRIM 800-160 MG PO TABS
1.0000 | ORAL_TABLET | Freq: Once | ORAL | Status: AC
  Administered 2020-03-09: 1 via ORAL
  Filled 2020-03-09: qty 1

## 2020-03-09 NOTE — ED Triage Notes (Signed)
Pt c/o LUQ pain that radiates to the back with N/V since Sunday. Denies having hx of same. Denies diarrhea.

## 2020-03-09 NOTE — ED Provider Notes (Signed)
[facility] Emergency Department Provider Note  Time seen: 2:02 PM  I have reviewed the triage vital signs and the nursing notes.   HISTORY  Chief Complaint Abdominal Pain   HPI Candice Guzman is a 59 y.o. female with a past medical history of depression, rheumatoid arthritis, presents to the emergency department for left flank pain.  According to the patient  over the past 5 days she has been experiencing intermittent moderate sharp pain in her left flank radiating to her left back.  No history of the same.  No history of kidney stones.  Denies any dysuria or hematuria.  Does state chills at times.  No shortness of breath or cough.  Describes her pain is a 4/10 currently but states 10/10 earlier today.  Past Medical History:  Diagnosis Date  . Depression   . Dyspnea   . Insomnia   . Lumbar herniated disc   . Menopause   . Rheumatoid arthritis (HCC)   . Shoulder pain   . Vitamin D deficiency     Patient Active Problem List   Diagnosis Date Noted  . 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 Pain in joint of right shoulder 01/18/2019  . Stiffness of right shoulder joint 01/18/2019  . Muscle weakness 01/18/2019  . Osteoarthritis involving multiple joints 12/07/2018  . Moderate episode of recurrent major depressive disorder (HCC) 08/27/2018  . Foot drop (Right) 08/04/2018  . Abnormal MRI, lumbar spine (06/21/2018) 08/04/2018  . Chronic lumbar radicular pain (L5 dermatome) (Bilateral) (R>L) 05/26/2018  . Long term prescription benzodiazepine use 09/21/2017  . Chronic lower extremity pain (Primary Area of Pain) (Bilateral) (R>L) 09/21/2017  . Chronic low back pain (Secondary Area of Pain) (Bilateral) (R>L) 09/21/2017  . Pharmacologic therapy 09/21/2017  . Problems influencing health status 09/21/2017  . Spondylosis without myelopathy or radiculopathy, lumbosacral region 09/21/2017  . Chronic hip pain [facility] Area of Pain) (Bilateral) (R>L) 09/21/2017  .  Chronic shoulder pain (Bilateral) (L>R) 09/21/2017  . Grade 1 Anterolisthesis of L5 over S1 & L3 over L4 09/21/2017  . Grade 1 Retrolisthesis L1 over L2 & L2 over L3 09/21/2017  . DDD (degenerative disc disease), lumbar 09/21/2017  . 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 Lumbar facet joint syndrome (Bilateral) (R>L) 09/21/2017  . Chronic musculoskeletal pain 09/21/2017  . Neurogenic pain 09/21/2017  . Lumbar foraminal stenosis (L5-S1) (Bilateral) 09/21/2017  . Lumbar lateral recess stenosis (Bilateral) (B: L4-5) (L>R: L3-4) 09/21/2017  . Lumbar facet hypertrophy (Bilateral: L3-4, L4-5, and L5-S1) 09/21/2017  . Chronic pain syndrome 07/08/2017  . Chronic knee pain (Fourth Area of Pain) (Bilateral) 07/08/2017  . Chronic wrist pain (Fifth Area of Pain) (Bilateral) 07/08/2017  . Disorder of skeletal system 07/08/2017  . Other long term (current) drug therapy 07/08/2017  . Long term current use of opiate analgesic 07/08/2017  . Other specified health status 07/08/2017  . Chronic pain of multiple joints 05/20/2017  . Insomnia, persistent 12/18/2014  . Depression, major, single episode, moderate (HCC) 12/18/2014  . Menopausal symptom 12/18/2014  . Vitamin D insufficiency 12/18/2014  . Inflammatory arthritis (HCC) 02/23/2014  . Lumbosacral radiculopathy (L5) (Right) 12/12/2013  . Bulge of lumbar disc without myelopathy 06/18/2011    Past Surgical History:  Procedure Laterality Date  . BREAST BIOPSY Left 2010   neg  . CESAREAN SECTION    . FINGER ARTHROPLASTY Left 07/24/2017   Thumb  . GANGLION CYST EXCISION    . METACARPOPHALANGEAL JOINT ARTHROPLASTY Right  06/06/2016   Dr. Dayna Barker   . SHOULDER ARTHROSCOPY WITH ROTATOR CUFF REPAIR Right 02/24/2019   Procedure: RIGHT SHOULDER ARTHROSCOPY WITH SUBACROMIAL DECOMPRESSION, DISTAL CLAVICLE EXCISION, LYSIS OF ADHESIONS,CAPSULOTOMY,MINI OPEN ROTATOR CUFF REPAIR;  Surgeon: Juanell Fairly, MD;  Location: [facility]  ORS;  Service: Orthopedics;  Laterality: Right;    Prior to Admission medications   Medication Sig Start Date End Date Taking? Authorizing Provider  ALPRAZolam Prudy Feeler) 0.5 MG tablet TAKE 1 TABLET(0.5 MG) BY MOUTH AT BEDTIME AS NEEDED 02/15/20   Danelle Berry, PA-C  cloNIDine (CATAPRES) 0.1 MG tablet TAKE 1 TABLET BY MOUTH  TWICE DAILY 06/28/19   Alba Cory, MD  DULoxetine (CYMBALTA) 60 MG capsule Take 1 capsule (60 mg total) by mouth daily. 02/15/20   Danelle Berry, PA-C  eszopiclone (LUNESTA) 2 MG TABS tablet Take 1 tablet (2 mg total) by mouth at bedtime. Take immediately before bedtime 02/15/20   Danelle Berry, PA-C  HUMIRA PEN 40 MG/0.8ML PNKT Inject 40 mg into the skin every 14 (fourteen) days.  11/01/15   Kandyce Rud., MD  predniSONE (STERAPRED UNI-PAK 21 TAB) 10 MG (21) TBPK tablet Take 60 mg po on day 1, 50 mg po on day 2... 10 mg po on day 6 02/15/20   Danelle Berry, PA-C  pregabalin (LYRICA) 75 MG capsule Take 1 capsule (75 mg total) by mouth 3 (three) times daily. 09/21/19 03/19/20  Delano Metz, MD  tiZANidine (ZANAFLEX) 4 MG tablet Take 1 tablet (4 mg total) by mouth every 8 (eight) hours as needed for muscle spasms. Must last 30 days 09/21/19 03/19/20  Delano Metz, MD  traMADol (ULTRAM) 50 MG tablet Take 1 tablet (50 mg total) by mouth every 12 (twelve) hours as needed for severe pain. Must last 30 days 09/21/19 03/19/20  Delano Metz, MD    Allergies  Allergen Reactions  . Codeine Itching  . Methotrexate Nausea Only    Family History  Problem Relation Age of Onset  . Emphysema Mother   . Cancer Father 60       lung  . Cancer Brother        tongue    Social History Social History   Tobacco Use  . Smoking status: Never Smoker  . Smokeless tobacco: Never Used  Vaping Use  . Vaping Use: Never used  Substance Use Topics  . Alcohol use: No    Alcohol/week: 0.0 standard drinks  . Drug use: No    Review of Systems Constitutional: Negative for  fever, occasional chills. Cardiovascular: Negative for chest pain. Respiratory: Negative for shortness of breath. Gastrointestinal: Left flank pain.  Positive for nausea and vomiting intermittently over the past several days. Genitourinary: Negative for urinary compaints Musculoskeletal: Negative for musculoskeletal complaints Neurological: Negative for headache All other ROS negative  ____________________________________________   PHYSICAL EXAM:  VITAL SIGNS: ED Triage Vitals  Enc Vitals Group     BP 03/09/20 1215 (!) 146/78     Pulse Rate 03/09/20 1215 81     Resp 03/09/20 1215 16     Temp 03/09/20 1218 98.5 F (36.9 C)     Temp Source 03/09/20 1215 Oral     SpO2 03/09/20 1215 100 %     Weight 03/09/20 1216 142 lb (64.4 kg)     Height 03/09/20 1216 5\' 3"  (1.6 m)     Head Circumference --      Peak Flow --      Pain Score 03/09/20 1215 10     Pain  Loc --      Pain Edu? --      Excl. in GC? --    Constitutional: Alert and oriented. Well appearing and in no distress. Eyes: Normal exam ENT      Head: Normocephalic and atraumatic.      Mouth/Throat: Mucous membranes are moist. Cardiovascular: Normal rate, regular rhythm.  Respiratory: Normal respiratory effort without tachypnea nor retractions. Breath sounds are clear  Gastrointestinal: Soft and nontender. No distention.  Moderate left CVA tenderness palpation Musculoskeletal: Nontender with normal range of motion in all extremities.  Neurologic:  Normal speech and language. No gross focal neurologic deficits  Skin:  Skin is warm, dry and intact.  Psychiatric: Mood and affect are normal.   ____________________________________________    EKG  EKG viewed and interpreted by myself shows a normal sinus rhythm at 74 bpm with a narrow QRS, normal axis, normal intervals, no concerning ST changes  ____________________________________________    RADIOLOGY  CT shows obstructing 3 mm left proximal ureteral  stone.  ____________________________________________   INITIAL IMPRESSION / ASSESSMENT AND PLAN / ED COURSE  Pertinent labs & imaging results that were available during my care of the patient were reviewed by me and considered in my medical decision making (see chart for details).   Patient presents emergency department for 5 days of intermittent left flank pain.  Differential would include gastritis, ureterolithiasis, UTI or pyelonephritis.  Patient's lab work is largely within normal limits besides a very slight leukocytosis.  Reassuringly normal LFTs and lipase.  Urinalysis is pending.  Given the patient's left flank pain with moderate CVA tenderness to palpation we will proceed with CT renal scan to rule out ureterolithiasis.  We will treat with Toradol.  Patient agreeable plan of care.  CT shows a 3 mm left proximal ureteral stone.  Urinalysis is somewhat equivocal.  Given these findings I spoke to Dr. Ronne Binning of urology, who recommends starting the patient on Bactrim.  We will also discharged on Toradol and Flomax.  We will have the patient follow-up with Dr. Ronne Binning in the office.  I discussed my typical kidney stone return precautions.  ETHYLENE REZNICK was evaluated in Emergency Department on 03/09/2020 for the symptoms described in the history of present illness. She was evaluated in the context of the global COVID-19 pandemic, which necessitated consideration that the patient might be at risk for infection with the SARS-CoV-2 virus that causes COVID-19. Institutional protocols and algorithms that pertain to the evaluation of patients at risk for COVID-19 are in a state of rapid change based on information released by regulatory bodies including the CDC and federal and state organizations. These policies and algorithms were followed during the patient's care in the ED.  ____________________________________________   FINAL CLINICAL IMPRESSION(S) / ED DIAGNOSES  Flank pain Kidney  stone   Minna Antis, MD 03/09/20 1603

## 2020-03-09 NOTE — Telephone Encounter (Signed)
Pt. Reports she started having pain on left side, at ribs and radiates to her back. Pain comes and goes. Heat helps.Feels like she has had a fever. Has had chills. No availability today in the practice. Pt. Will go to UC.  Reason for Disposition . [1] Fever AND [2] no symptoms of UTI  (Exception: has generalized muscle pains, not localized back pain)  Answer Assessment - Initial Assessment Questions 1. ONSET: "When did the pain begin?"      Sunday 2. LOCATION: "Where does it hurt?" (upper, mid or lower back)     Left ribs and goes to back 3. SEVERITY: "How bad is the pain?"  (e.g., Scale 1-10; mild, moderate, or severe)   - MILD (1-3): doesn\'t interfere with normal activities    - MODERATE (4-7): interferes with normal activities or awakens from sleep    - SEVERE (8-10): excruciating pain, unable to do any normal activities      10  4. PATTERN: "Is the pain constant?" (e.g., yes, no; constant, intermittent)      Comes and goes 5. RADIATION: "Does the pain shoot into your legs or elsewhere?"     Ribs 6. CAUSE:  "What do you think is causing the back pain?"      Unsure 7. BACK OVERUSE:  "Any recent lifting of heavy objects, strenuous work or exercise?"     *No 8. MEDICATIONS: "What have you taken so far for the pain?" (e.g., nothing, acetaminophen, NSAIDS)     Tylenol 9. NEUROLOGIC SYMPTOMS: "Do you have any weakness, numbness, or problems with bowel/bladder control?"     No 10. OTHER SYMPTOMS: "Do you have any other symptoms?" (e.g., fever, abdominal pain, burning with urination, blood in urine)       Possible fever, chills 11. PREGNANCY: "Is there any chance you are pregnant?" (e.g., yes, no; LMP)       No  Protocols used: BACK PAIN-A-AH

## 2020-03-10 LAB — URINE CULTURE: Culture: NO GROWTH

## 2020-03-13 NOTE — Progress Notes (Signed)
03/14/2020 9:43 AM   Candice Guzman March 15, 1961 [id number]  Referring provider: Alba Cory, MD [address] [place],  [place] [address] Chief Complaint  Patient presents with   Nephrolithiasis    HPI: Candice Guzman is a 59 y.o. female who presents today for evaluation and management of left ureteral stone.  She was presented to the ED for abdominal pain on 03/09/2020. She had left flank pain. She had intermittent moderate sharp pain in her left flank radiating to her left back x 5 days. Pain was a 4/10. She had off and on chills. No history of kidney stones. Denied any dysuria or hematuria.  UA showed gross hematuria, 21-50 RBC, 21-50 WBC, and rare bacteria. Culture was negative. Pain was treated with Toradol. She was placed on Flomax and bactrim.   CT showed a 3 mm left proximal ureteral stone.   Symptoms started off minimal and gradually worsened. During the week she have fevers and chills. She had discomfort in her ribs and intermittent left flank pain.   She is unsure if she passed the stone. She notes ER did not discharge her with a strainer. She denies a history of kidney stones. No hematuria or dysuria.  She has not had pain since leaving the ER.  Never smoker.    PMH: Past Medical History:  Diagnosis Date   Depression    Dyspnea    Insomnia    Lumbar herniated disc    Menopause    Rheumatoid arthritis (HCC)    Shoulder pain    Vitamin D deficiency     Surgical History: Past Surgical History:  Procedure Laterality Date   BREAST BIOPSY Left 2010   neg   CESAREAN SECTION     FINGER ARTHROPLASTY Left 07/24/2017   Thumb   GANGLION CYST EXCISION     METACARPOPHALANGEAL JOINT ARTHROPLASTY Right 06/06/2016   Dr. Dayna Barker    SHOULDER ARTHROSCOPY WITH ROTATOR CUFF REPAIR Right 02/24/2019   Procedure: RIGHT SHOULDER ARTHROSCOPY WITH SUBACROMIAL DECOMPRESSION, DISTAL CLAVICLE EXCISION, LYSIS OF ADHESIONS,CAPSULOTOMY,MINI OPEN  ROTATOR CUFF REPAIR;  Surgeon: Juanell Fairly, MD;  Location: [facility] ORS;  Service: Orthopedics;  Laterality: Right;    Home Medications:  Allergies as of 03/14/2020      Reactions   Codeine Itching   Methotrexate Nausea Only      Medication List       Accurate as of March 14, 2020 11:59 PM. If you have any questions, ask your nurse or doctor.        STOP taking these medications   predniSONE 10 MG (21) Tbpk tablet Commonly known as: STERAPRED UNI-PAK 21 TAB Stopped by: Vanna Scotland, MD     TAKE these medications   ALPRAZolam 0.5 MG tablet Commonly known as: XANAX TAKE 1 TABLET(0.5 MG) BY MOUTH AT BEDTIME AS NEEDED   cloNIDine 0.1 MG tablet Commonly known as: CATAPRES TAKE 1 TABLET BY MOUTH  TWICE DAILY   DULoxetine 60 MG capsule Commonly known as: Cymbalta Take 1 capsule (60 mg total) by mouth daily.   eszopiclone 2 MG Tabs tablet Commonly known as: LUNESTA Take 1 tablet (2 mg total) by mouth at bedtime. Take immediately before bedtime   Humira Pen 40 MG/0.8ML Pnkt Generic drug: Adalimumab Inject 40 mg into the skin every 14 (fourteen) days.   ketorolac 10 MG tablet Commonly known as: TORADOL Take 1 tablet (10 mg total) by mouth every 6 (six) hours as needed.   pregabalin 75 MG capsule Commonly known  as: LYRICA Take 1 capsule (75 mg total) by mouth 3 (three) times daily.   sulfamethoxazole-trimethoprim 800-160 MG tablet Commonly known as: BACTRIM DS Take 1 tablet by mouth 2 (two) times daily.   tamsulosin 0.4 MG Caps capsule Commonly known as: FLOMAX Take 1 capsule (0.4 mg total) by mouth daily.   tiZANidine 4 MG tablet Commonly known as: ZANAFLEX Take 1 tablet (4 mg total) by mouth every 8 (eight) hours as needed for muscle spasms. Must last 30 days   traMADol 50 MG tablet Commonly known as: ULTRAM Take 1 tablet (50 mg total) by mouth every 12 (twelve) hours as needed for severe pain. Must last 30 days       Allergies:  Allergies  Allergen  Reactions   Codeine Itching   Methotrexate Nausea Only    Family History: Family History  Problem Relation Age of Onset   Emphysema Mother    Cancer Father 33       lung   Cancer Brother        tongue    Social History:  reports that she has never smoked. She has never used smokeless tobacco. She reports that she does not drink alcohol and does not use drugs.   Physical Exam: BP (!) 164/96    Pulse 92    Ht 5\' 3"  (1.6 m)    Wt 142 lb (64.4 kg)    LMP 12/18/2014    BMI 25.15 kg/m   Constitutional:  Alert and oriented, No acute distress. HEENT: [place] AT, moist mucus membranes.  Trachea midline, no masses. Cardiovascular: No clubbing, cyanosis, or edema. Respiratory: Normal respiratory effort, no increased work of breathing. Skin: No rashes, bruises or suspicious lesions. Neurologic: Grossly intact, no focal deficits, moving all 4 extremities. Psychiatric: Normal mood and affect.  Laboratory Data:  Lab Results  Component Value Date   CREATININE 0.77 03/09/2020    Pertinent Imaging: Results for orders placed during the hospital encounter of 03/09/20  CT Renal Stone Study  Narrative CLINICAL DATA:  Left upper quadrant pain radiating to the back  EXAM: CT ABDOMEN AND PELVIS WITHOUT CONTRAST  TECHNIQUE: Multidetector CT imaging of the abdomen and pelvis was performed following the standard protocol without IV contrast.  COMPARISON:  None.  FINDINGS: Lower chest: No acute abnormality.  Hepatobiliary: Too small to characterize hypoattenuating lesion of the inferior right hepatic lobe. Liver is otherwise unremarkable. Gallbladder is unremarkable. No biliary dilatation.  Pancreas: Unremarkable.  Spleen: Unremarkable.  Adrenals/Urinary Tract: Adrenals and right kidney are unremarkable. There is left hydronephrosis. An obstructing calculus is present within the proximal left ureter measuring 3 mm. Bladder is unremarkable.  Stomach/Bowel: Stomach is within  normal limits. Bowel is normal in caliber. Normal appendix.  Vascular/Lymphatic: No significant vascular abnormality on this noncontrast study. There are no enlarged lymph identified  Reproductive: Uterus and bilateral adnexa are unremarkable.  Other: No ascites.  Abdominal wall is unremarkable.  Musculoskeletal: Degenerative changes of the lumbar spine. No acute osseous abnormality.  IMPRESSION: Obstructing 3 mm calculus of the proximal left ureter. Left hydronephrosis.  Stone measuring 950 Hu.  Electronically Signed By: 05/09/20 M.D. On: 03/09/2020 14:42  I have personally reviewed the images and agree with radiologist interpretation.    Assessment & Plan:    1. Left ureteral stone Based on the size and location of the stone, along with absence of pain since leaving the ER, is highly likely that she passed the stone spontaneously despite not seeing it pass.  I offered her conservative management versus definitive follow-up imaging.  She would like to be 100% sure that the stone is gone.  She opted for renal ultrasound to assess for resolution of her hydronephrosis.  We discussed general stone prevention techniques including drinking plenty water with goal of producing 2.5 L urine daily, increased citric acid intake, avoidance of high oxalate containing foods, and decreased salt intake.  Information about dietary recommendations given today.   Will plan for follow up RUS- call with results  Return if symptoms worsen or fail to improve.  [facility] [address] [place], [place] [address] [phone]  I, Theador Hawthorne, am acting as a scribe for Dr. Vanna Scotland.  I have reviewed the above documentation for accuracy and completeness, and I agree with the above.   Vanna Scotland, MD

## 2020-03-14 ENCOUNTER — Other Ambulatory Visit: Payer: Self-pay

## 2020-03-14 ENCOUNTER — Ambulatory Visit: Payer: Managed Care, Other (non HMO) | Admitting: Urology

## 2020-03-14 VITALS — BP 164/96 | HR 92 | Ht 63.0 in | Wt 142.0 lb

## 2020-03-14 DIAGNOSIS — N200 Calculus of kidney: Secondary | ICD-10-CM

## 2020-03-19 ENCOUNTER — Encounter: Payer: Managed Care, Other (non HMO) | Admitting: Pain Medicine

## 2020-04-17 NOTE — Progress Notes (Signed)
PROVIDER NOTE: Information contained herein reflects review and annotations entered in association with encounter. Interpretation of such information and data should be left to medically-trained personnel. Information provided to patient can be located elsewhere in the medical record under "Patient Instructions". Document created using STT-dictation technology, any transcriptional errors that may result from process are unintentional.    Patient: Candice Guzman  Service Category: E/M  Provider: Gaspar Cola, MD  DOB: 17-Jul-1960  DOS: 04/18/2020  Specialty: Interventional Pain Management  MRN: [id number]  Setting: Ambulatory outpatient  PCP: Steele Sizer, MD  Type: Established Patient    Referring Provider: Steele Sizer, MD  Location: Office  Delivery: Face-to-face     HPI  [name], a 59 y.o. year old female, is here today because of her Chronic pain syndrome [G89.4]. [name] primary complain today is Back Pain (lumbar bilateral,right is worse.) Last encounter: My last encounter with her was on 01/27/2020. Pertinent problems: 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[place]); Chronic pain of multiple joints; Chronic pain syndrome; Chronic knee pain (4th area of Pain) (Bilateral); Chronic wrist pain (Fifth Area of Pain) (Bilateral); Chronic lower extremity pain (1ry area of Pain) (Bilateral) (R>L); Chronic low back pain (2ry area of Pain) (Bilateral) (R>L); Spondylosis without myelopathy or radiculopathy, lumbosacral region; Chronic hip pain (3ry area of Pain) (Bilateral) (R>L); Chronic shoulder pain (Bilateral) (L>R); Grade 1 Anterolisthesis of L5 over S1 & L3 over L4; Grade 1 Retrolisthesis L1 over L2 & L2 over L3; DDD (degenerative disc disease), lumbar; 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 Pain Assessment: Severity of Chronic pain is reported as a 2 /10. Location: Back Lower, Left, Right/numbness into right leg all the way down to foot. Onset: More than a month ago. Quality: Discomfort, Constant, Throbbing, Numbness. Timing:  . Modifying factor(s): pain medications, changing positions, heating pad. Vitals:  height is 5' 4"  (1.626 m) and weight is 142 lb (64.4 kg). Her temporal temperature is 98.1 F (36.7 C). Her blood pressure is 142/75 (abnormal) and her pulse is 93. Her respiration is 16 and oxygen saturation is 100%.   Reason for encounter: medication management.  The patient indicates doing well with the current medication regimen. No adverse reactions or side effects reported to the medications.   RTCB: 07/17/2020 Nonopioids transferred: Lyrica and Zanaflex.  Pharmacotherapy Assessment   Analgesic: Tramadol 50 mg 1-2tablet p.o. twice daily (122m/day of tramadol) MME/day:184mday.   Monitoring: [place] PMP: PDMP reviewed during this encounter.       Pharmacotherapy: No side-effects or adverse reactions reported. Compliance: No problems identified. Effectiveness: Clinically acceptable.  PaJanett BillowRN  04/18/2020  1:51 PM  Sign when Signing Visit Nursing Pain Medication Assessment:  Safety precautions to be maintained throughout the outpatient stay will include: orient to surroundings, keep bed in low position, maintain call bell within reach at all times, provide assistance with transfer out of bed and  ambulation.  Medication Inspection Compliance: Pill count conducted under aseptic conditions, in front of the patient. Neither  the pills nor the bottle was removed from the patient's sight at any time. Once count was completed pills were immediately returned to the patient in their original bottle.  Medication: Tramadol (Ultram) Pill/Patch Count: 29 of 60 pills remain Pill/Patch Appearance: Markings consistent with prescribed medication Bottle Appearance: Standard pharmacy container. Clearly labeled. Filled Date: 08 / 05 / 2021 Last Medication intake:  Today    UDS:  Summary  Date Value Ref Range Status  01/05/2018 FINAL  Final    Comment:    ==================================================================== TOXASSURE SELECT 13 (MW) ==================================================================== Test                             Result       Flag       Units Drug Present and Declared for Prescription Verification   Tramadol                       2773         EXPECTED   ng/mg creat   O-Desmethyltramadol            6615         EXPECTED   ng/mg creat   N-Desmethyltramadol            3080         EXPECTED   ng/mg creat    Source of tramadol is a prescription medication.    O-desmethyltramadol and N-desmethyltramadol are expected    metabolites of tramadol. Drug Absent but Declared for Prescription Verification   Alprazolam                     Not Detected UNEXPECTED ng/mg creat ==================================================================== Test                      Result    Flag   Units      Ref Range   Creatinine              40               mg/dL      >=20 ==================================================================== Declared Medications:  The flagging and interpretation on this report are based on the  following declared medications.  Unexpected results may arise from  inaccuracies in the declared medications.  **Note: The testing scope of this panel includes  these medications:  Alprazolam (Xanax)  Tramadol (Ultram)  **Note: The testing scope of this panel does not include following  reported medications:  Adalimumab (Humira)  Clonidine (Catapres)  Duloxetine (Cymbalta)  Estradiol (Estrace)  Methotrexate  Zolpidem (Ambien) ==================================================================== For clinical consultation, please call [phone]. ====================================================================      ROS  Constitutional: Denies any fever or chills Gastrointestinal: No reported hemesis, hematochezia, vomiting, or acute GI distress Musculoskeletal: Denies any acute onset joint swelling, redness, loss of ROM, or weakness Neurological: No reported episodes of acute onset apraxia, aphasia, dysarthria, agnosia, amnesia, paralysis, loss of coordination, or loss of consciousness  Medication Review  ALPRAZolam, Adalimumab, DULoxetine, cloNIDine, eszopiclone, ketorolac, pregabalin, sulfamethoxazole-trimethoprim, tamsulosin, tiZANidine, and traMADol  History Review  Allergy: Ms. Hodgkins is allergic to codeine and methotrexate. Drug: Ms. Wiersma  reports no history of drug use. Alcohol:  reports no history of alcohol use. Tobacco:  reports that she has never smoked. She has never used smokeless tobacco. Social: Ms. Racanelli  reports that she has never smoked. She has never used smokeless tobacco.  She reports that she does not drink alcohol and does not use drugs. Medical:  has a past medical history of Depression, Dyspnea, Insomnia, Lumbar herniated disc, Menopause, Rheumatoid arthritis ([place]), Shoulder pain, and Vitamin D deficiency. Surgical: Ms. Estala  has a past surgical history that includes Ganglion cyst excision; Cesarean section; Breast biopsy (Left, 2010); Metacarpophalangeal joint arthroplasty (Right, 06/06/2016); Finger arthroplasty (Left, 07/24/2017); and Shoulder arthroscopy with rotator cuff repair (Right,  02/24/2019). Family: family history includes Cancer in her brother; Cancer (age of onset: 33) in her father; Emphysema in her mother.  Laboratory Chemistry Profile   Renal Lab Results  Component Value Date   BUN 10 03/09/2020   CREATININE 0.77 03/09/2020   BCR 11 11/26/2018   GFRAA >60 03/09/2020   GFRNONAA >60 03/09/2020     Hepatic Lab Results  Component Value Date   AST 17 03/09/2020   ALT 13 03/09/2020   ALBUMIN 3.8 03/09/2020   ALKPHOS 105 03/09/2020   LIPASE 18 03/09/2020     Electrolytes Lab Results  Component Value Date   NA 142 03/09/2020   K 3.1 (L) 03/09/2020   CL 104 03/09/2020   CALCIUM 9.5 03/09/2020   MG 2.1 07/08/2017     Bone Lab Results  Component Value Date   25OHVITD1 24 (L) 07/08/2017   25OHVITD2 <1.0 07/08/2017   25OHVITD3 24 07/08/2017     Inflammation (CRP: Acute Phase) (ESR: Chronic Phase) Lab Results  Component Value Date   CRP 2.2 07/08/2017   ESRSEDRATE CANCELED 07/08/2017       Note: Above Lab results reviewed.  Recent Imaging Review  CT Renal Stone Study CLINICAL DATA:  Left upper quadrant pain radiating to the back  EXAM: CT ABDOMEN AND PELVIS WITHOUT CONTRAST  TECHNIQUE: Multidetector CT imaging of the abdomen and pelvis was performed following the standard protocol without IV contrast.  COMPARISON:  None.  FINDINGS: Lower chest: No acute abnormality.  Hepatobiliary: Too small to characterize hypoattenuating lesion of the inferior right hepatic lobe. Liver is otherwise unremarkable. Gallbladder is unremarkable. No biliary dilatation.  Pancreas: Unremarkable.  Spleen: Unremarkable.  Adrenals/Urinary Tract: Adrenals and right kidney are unremarkable. There is left hydronephrosis. An obstructing calculus is present within the proximal left ureter measuring 3 mm. Bladder is unremarkable.  Stomach/Bowel: Stomach is within normal limits. Bowel is normal in caliber. Normal appendix.  Vascular/Lymphatic: No  significant vascular abnormality on this noncontrast study. There are no enlarged lymph identified  Reproductive: Uterus and bilateral adnexa are unremarkable.  Other: No ascites.  Abdominal wall is unremarkable.  Musculoskeletal: Degenerative changes of the lumbar spine. No acute osseous abnormality.  IMPRESSION: Obstructing 3 mm calculus of the proximal left ureter. Left hydronephrosis.  Electronically Signed   By: Macy Mis M.D.   On: 03/09/2020 14:42 Note: Reviewed        Physical Exam  General appearance: Well nourished, well developed, and well hydrated. In no apparent acute distress Mental status: Alert, oriented x 3 (person, place, & time)       Respiratory: No evidence of acute respiratory distress Eyes: PERLA Vitals: BP (!) 142/75 (BP Location: Right Arm, Patient Position: Sitting, Cuff Size: Normal)    Pulse 93    Temp 98.1 F (36.7 C) (Temporal)    Resp 16    Ht 5' 4"  (1.626 m)    Wt 142 lb (64.4 kg)    LMP 12/18/2014    SpO2 100%    BMI 24.37 kg/m  BMI: Estimated body mass index is  24.37 kg/m as calculated from the following:   Height as of this encounter: 5' 4"  (1.626 m).   Weight as of this encounter: 142 lb (64.4 kg). Ideal: Ideal body weight: 54.7 kg (120 lb 9.5 oz) Adjusted ideal body weight: 58.6 kg (129 lb 2.5 oz)  Assessment   Status Diagnosis  Controlled Controlled Controlled 1. Chronic pain syndrome   2. Chronic lower extremity pain (1ry area of Pain) (Bilateral) (R>L)   3. Chronic low back pain (2ry area of Pain) (Bilateral) (R>L)   4. Chronic hip pain (3ry area of Pain) (Bilateral) (R>L)   5. Chronic knee pain (4th area of Pain) (Bilateral)   6. Pharmacologic therapy   7. Chronic low back pain (Secondary Area of Pain) (Bilateral) (R>L)   8. Chronic musculoskeletal pain   9. Neurogenic pain      Updated Problems: Problem  Chronic lower extremity pain (1ry area of Pain) (Bilateral) (R>L)  Chronic low back pain (2ry area of Pain)  (Bilateral) (R>L)  Chronic hip pain (3ry area of Pain) (Bilateral) (R>L)  Chronic knee pain (4th area of Pain) (Bilateral)    Plan of Care  Problem-specific:  No problem-specific Assessment & Plan notes found for this encounter.  Ms. KOHANA AMBLE has a current medication list which includes the following long-term medication(s): clonidine, duloxetine, humira pen, eszopiclone, pregabalin, tizanidine, and tramadol.  Pharmacotherapy (Medications Ordered): Meds ordered this encounter  Medications   tiZANidine (ZANAFLEX) 4 MG tablet    Sig: Take 1 tablet (4 mg total) by mouth every 8 (eight) hours as needed for muscle spasms. Must last 30 days    Dispense:  90 tablet    Refill:  2    Fill one day early if pharmacy is closed on scheduled refill date. May substitute for generic if available.   pregabalin (LYRICA) 75 MG capsule    Sig: Take 1 capsule (75 mg total) by mouth at bedtime.    Dispense:  90 capsule    Refill:  0    Fill one day early if pharmacy is closed on scheduled refill date. May substitute for generic if available.   traMADol (ULTRAM) 50 MG tablet    Sig: Take 1 tablet (50 mg total) by mouth every 12 (twelve) hours as needed for severe pain. Must last 30 days    Dispense:  60 tablet    Refill:  2    Chronic Pain: STOP Act (Not applicable) Fill 1 day early if closed on refill date. Avoid benzodiazepines within 8 hours of opioids   Orders:  No orders of the defined types were placed in this encounter.  Follow-up plan:   Return in about 3 months (around 07/17/2020) for (F2F), (Med Mgmt).      Interventional management options: Planned, scheduled, and/or pending: Therapeutic right-sided L4-5 interlaminar LESI under fluoroscopic guidance, no sedation Diagnostic right L5 transforaminal ESI#3under fluoroscopic guidance no sedation   Considering: Diagnostic bilateral L5 transforaminal LESI Diagnostic bilateral lumbar facet block Possible bilateral lumbar  facet RFA Diagnostic bilateral intra-articular hip joint injection Diagnostic bilateral femoral nerve block + obturator nerve block Possible bilateral femoral nerve + obturator nerve RFA Diagnostic bilateral intra-articular shoulder joint injection Diagnostic bilateral suprascapular nerve block Possible bilateral suprascapular nerve RFA Diagnostic Bilateral intra-articular wrist injections Diagnosticbilateral intra-articular knee injections Possible bilateral intra-articular Hyalgan knee injection Diagnostic bilateral genicular nerve block Possible bilateral genicular nerve RFA   Palliative PRN treatment(s): Palliative/therapeuticbilateral L5 transforaminal ESI#3under fluoroscopic guidance and IV sedation  Recent Visits No visits were found meeting these conditions. Showing recent visits within past 90 days and meeting all other requirements Today's Visits Date Type Provider Dept  04/18/20 Office Visit Milinda Pointer, MD [facility]  Showing today's visits and meeting all other requirements Future Appointments No visits were found meeting these conditions. Showing future appointments within next 90 days and meeting all other requirements  I discussed the assessment and treatment plan with the patient. The patient was provided an opportunity to ask questions and all were answered. The patient agreed with the plan and demonstrated an understanding of the instructions.  Patient advised to call back or seek an in-person evaluation if the symptoms or condition worsens.  Duration of encounter: 30 minutes.  Note by: Gaspar Cola, MD Date: 04/18/2020; Time: 1:58 PM

## 2020-04-18 ENCOUNTER — Encounter: Payer: Self-pay | Admitting: Pain Medicine

## 2020-04-18 ENCOUNTER — Ambulatory Visit: Payer: Managed Care, Other (non HMO) | Attending: Pain Medicine | Admitting: Pain Medicine

## 2020-04-18 ENCOUNTER — Other Ambulatory Visit: Payer: Self-pay

## 2020-04-18 VITALS — BP 142/75 | HR 93 | Temp 98.1°F | Resp 16 | Ht 64.0 in | Wt 142.0 lb

## 2020-04-18 DIAGNOSIS — M792 Neuralgia and neuritis, unspecified: Secondary | ICD-10-CM | POA: Diagnosis present

## 2020-04-18 DIAGNOSIS — G8929 Other chronic pain: Secondary | ICD-10-CM | POA: Diagnosis present

## 2020-04-18 DIAGNOSIS — Z79899 Other long term (current) drug therapy: Secondary | ICD-10-CM | POA: Diagnosis present

## 2020-04-18 DIAGNOSIS — M7918 Myalgia, other site: Secondary | ICD-10-CM | POA: Insufficient documentation

## 2020-04-18 DIAGNOSIS — M25561 Pain in right knee: Secondary | ICD-10-CM | POA: Insufficient documentation

## 2020-04-18 DIAGNOSIS — M25552 Pain in left hip: Secondary | ICD-10-CM | POA: Diagnosis present

## 2020-04-18 DIAGNOSIS — M25562 Pain in left knee: Secondary | ICD-10-CM | POA: Diagnosis present

## 2020-04-18 DIAGNOSIS — M25551 Pain in right hip: Secondary | ICD-10-CM | POA: Insufficient documentation

## 2020-04-18 DIAGNOSIS — M79604 Pain in right leg: Secondary | ICD-10-CM | POA: Diagnosis not present

## 2020-04-18 DIAGNOSIS — G894 Chronic pain syndrome: Secondary | ICD-10-CM | POA: Diagnosis not present

## 2020-04-18 DIAGNOSIS — M79605 Pain in left leg: Secondary | ICD-10-CM | POA: Diagnosis present

## 2020-04-18 DIAGNOSIS — M5442 Lumbago with sciatica, left side: Secondary | ICD-10-CM | POA: Diagnosis not present

## 2020-04-18 DIAGNOSIS — M5441 Lumbago with sciatica, right side: Secondary | ICD-10-CM | POA: Diagnosis present

## 2020-04-18 MED ORDER — TRAMADOL HCL 50 MG PO TABS: 50.0000 mg | ORAL_TABLET | Freq: Two times a day (BID) | ORAL | 2 refills | Status: DC | PRN

## 2020-04-18 MED ORDER — TIZANIDINE HCL 4 MG PO TABS: 4.0000 mg | ORAL_TABLET | Freq: Three times a day (TID) | ORAL | 2 refills | Status: DC | PRN

## 2020-04-18 MED ORDER — PREGABALIN 75 MG PO CAPS: 75.0000 mg | ORAL_CAPSULE | Freq: Every day | ORAL | 0 refills | Status: DC

## 2020-04-18 NOTE — Patient Instructions (Signed)
____________________________________________________________________________________________  Medication Rules  Purpose: To inform patients, and their family members, of our rules and regulations.  Applies to: All patients receiving prescriptions (written or electronic).  Pharmacy of record: Pharmacy where electronic prescriptions will be sent. If written prescriptions are taken to a different pharmacy, please inform the nursing staff. The pharmacy listed in the electronic medical record should be the one where you would like electronic prescriptions to be sent.  Electronic prescriptions: In compliance with the [place] Strengthen Opioid Misuse Prevention (STOP) Act of 2017 (Session Law 2017-74/H243), effective July 07, 2018, all controlled substances must be electronically prescribed. Calling prescriptions to the pharmacy will cease to exist.  Prescription refills: Only during scheduled appointments. Applies to all prescriptions.  NOTE: The following applies primarily to controlled substances (Opioid* Pain Medications).   Type of encounter (visit): For patients receiving controlled substances, face-to-face visits are required. (Not an option or up to the patient.)  Patient's responsibilities: 1. Pain Pills: Bring all pain pills to every appointment (except for procedure appointments). 2. Pill Bottles: Bring pills in original pharmacy bottle. Always bring the newest bottle. Bring bottle, even if empty. 3. Medication refills: You are responsible for knowing and keeping track of what medications you take and those you need refilled. The day before your appointment: write a list of all prescriptions that need to be refilled. The day of the appointment: give the list to the admitting nurse. Prescriptions will be written only during appointments. No prescriptions will be written on procedure days. If you forget a medication: it will not be "Called in", "Faxed", or "electronically sent".  You will need to get another appointment to get these prescribed. No early refills. Do not call asking to have your prescription filled early. 4. Prescription Accuracy: You are responsible for carefully inspecting your prescriptions before leaving our office. Have the discharge nurse carefully go over each prescription with you, before taking them home. Make sure that your name is accurately spelled, that your address is correct. Check the name and dose of your medication to make sure it is accurate. Check the number of pills, and the written instructions to make sure they are clear and accurate. Make sure that you are given enough medication to last until your next medication refill appointment. 5. Taking Medication: Take medication as prescribed. When it comes to controlled substances, taking less pills or less frequently than prescribed is permitted and encouraged. Never take more pills than instructed. Never take medication more frequently than prescribed.  6. Inform other Doctors: Always inform, all of your healthcare providers, of all the medications you take. 7. Pain Medication from other Providers: You are not allowed to accept any additional pain medication from any other Doctor or Healthcare provider. There are two exceptions to this rule. (see below) In the event that you require additional pain medication, you are responsible for notifying us, as stated below. 8. Medication Agreement: You are responsible for carefully reading and following our Medication Agreement. This must be signed before receiving any prescriptions from our practice. Safely store a copy of your signed Agreement. Violations to the Agreement will result in no further prescriptions. (Additional copies of our Medication Agreement are available upon request.) 9. Laws, Rules, & Regulations: All patients are expected to follow all Federal and State Laws, Statutes, Rules, & Regulations. Ignorance of the Laws does not constitute a  valid excuse.  10. Illegal drugs and Controlled Substances: The use of illegal substances (including, but not limited to marijuana and its   derivatives) and/or the illegal use of any controlled substances is strictly prohibited. Violation of this rule may result in the immediate and permanent discontinuation of any and all prescriptions being written by our practice. The use of any illegal substances is prohibited. 11. Adopted CDC guidelines & recommendations: Target dosing levels will be at or below 60 MME/day. Use of benzodiazepines** is not recommended.  Exceptions: There are only two exceptions to the rule of not receiving pain medications from other Healthcare Providers. 1. Exception #1 (Emergencies): In the event of an emergency (i.e.: accident requiring emergency care), you are allowed to receive additional pain medication. However, you are responsible for: As soon as you are able, call our office [phone], at any time of the day or night, and leave a message stating your name, the date and nature of the emergency, and the name and dose of the medication prescribed. In the event that your call is answered by a member of our staff, make sure to document and save the date, time, and the name of the person that took your information.  2. Exception #2 (Planned Surgery): In the event that you are scheduled by another doctor or dentist to have any type of surgery or procedure, you are allowed (for a period no longer than 30 days), to receive additional pain medication, for the acute post-op pain. However, in this case, you are responsible for picking up a copy of our "Post-op Pain Management for Surgeons" handout, and giving it to your surgeon or dentist. This document is available at our office, and does not require an appointment to obtain it. Simply go to our office during business hours (Monday-Thursday from 8:00 AM to 4:00 PM) (Friday 8:00 AM to 12:00 Noon) or if you have a scheduled appointment  with us, prior to your surgery, and ask for it by name. In addition, you are responsible for: calling our office [phone], at any time of the day or night, and leaving a message stating your name, name of your surgeon, type of surgery, and date of procedure or surgery. Failure to comply with your responsibilities may result in termination of therapy involving the controlled substances.  *Opioid medications include: morphine, codeine, oxycodone, oxymorphone, hydrocodone, hydromorphone, meperidine, tramadol, tapentadol, buprenorphine, fentanyl, methadone. **Benzodiazepine medications include: diazepam (Valium), alprazolam (Xanax), clonazepam (Klonopine), lorazepam (Ativan), clorazepate (Tranxene), chlordiazepoxide (Librium), estazolam (Prosom), oxazepam (Serax), temazepam (Restoril), triazolam (Halcion) (Last updated: 03/13/2020) ____________________________________________________________________________________________   ____________________________________________________________________________________________  Medication Recommendations and Reminders  Applies to: All patients receiving prescriptions (written and/or electronic).  Medication Rules & Regulations: These rules and regulations exist for your safety and that of others. They are not flexible and neither are we. Dismissing or ignoring them will be considered "non-compliance" with medication therapy, resulting in complete and irreversible termination of such therapy. (See document titled "Medication Rules" for more details.) In all conscience, because of safety reasons, we cannot continue providing a therapy where the patient does not follow instructions.  Pharmacy of record:   Definition: This is the pharmacy where your electronic prescriptions will be sent.   We do not endorse any particular pharmacy, however, we have experienced problems with [facility] not securing enough medication supply for the community.  We do not  restrict you in your choice of pharmacy. However, once we write for your prescriptions, we will NOT be re-sending more prescriptions to fix restricted supply problems created by your pharmacy, or your insurance.   The pharmacy listed in the electronic medical record should be the   one where you want electronic prescriptions to be sent.  If you choose to change pharmacy, simply notify our nursing staff.  Recommendations:  Keep all of your pain medications in a safe place, under lock and key, even if you live alone. We will NOT replace lost, stolen, or damaged medication.  After you fill your prescription, take 1 week's worth of pills and put them away in a safe place. You should keep a separate, properly labeled bottle for this purpose. The remainder should be kept in the original bottle. Use this as your primary supply, until it runs out. Once it's gone, then you know that you have 1 week's worth of medicine, and it is time to come in for a prescription refill. If you do this correctly, it is unlikely that you will ever run out of medicine.  To make sure that the above recommendation works, it is very important that you make sure your medication refill appointments are scheduled at least 1 week before you run out of medicine. To do this in an effective manner, make sure that you do not leave the office without scheduling your next medication management appointment. Always ask the nursing staff to show you in your prescription , when your medication will be running out. Then arrange for the receptionist to get you a return appointment, at least 7 days before you run out of medicine. Do not wait until you have 1 or 2 pills left, to come in. This is very poor planning and does not take into consideration that we may need to cancel appointments due to bad weather, sickness, or emergencies affecting our staff.  DO NOT ACCEPT A "Partial Fill": If for any reason your pharmacy does not have enough pills/tablets  to completely fill or refill your prescription, do not allow for a "partial fill". The law allows the pharmacy to complete that prescription within 72 hours, without requiring a new prescription. If they do not fill the rest of your prescription within those 72 hours, you will need a separate prescription to fill the remaining amount, which we will NOT provide. If the reason for the partial fill is your insurance, you will need to talk to the pharmacist about payment alternatives for the remaining tablets, but again, DO NOT ACCEPT A PARTIAL FILL, unless you can trust your pharmacist to obtain the remainder of the pills within 72 hours.  Prescription refills and/or changes in medication(s):   Prescription refills, and/or changes in dose or medication, will be conducted only during scheduled medication management appointments. (Applies to both, written and electronic prescriptions.)  No refills on procedure days. No medication will be changed or started on procedure days. No changes, adjustments, and/or refills will be conducted on a procedure day. Doing so will interfere with the diagnostic portion of the procedure.  No phone refills. No medications will be "called into the pharmacy".  No Fax refills.  No weekend refills.  No Holliday refills.  No after hours refills.  Remember:  Business hours are:  Monday to Thursday 8:00 AM to 4:00 PM Provider's Schedule: Dayshawn Irizarry, MD - Appointments are:  Medication management: Monday and Wednesday 8:00 AM to 4:00 PM Procedure day: Tuesday and Thursday 7:30 AM to 4:00 PM Bilal Lateef, MD - Appointments are:  Medication management: Tuesday and Thursday 8:00 AM to 4:00 PM Procedure day: Monday and Wednesday 7:30 AM to 4:00 PM (Last update: 01/25/2020) ____________________________________________________________________________________________   ____________________________________________________________________________________________  CBD  (cannabidiol) WARNING  Applicable to: All individuals currently   taking or considering taking CBD (cannabidiol) and, more important, all patients taking opioid analgesic controlled substances (pain medication). (Example: oxycodone; oxymorphone; hydrocodone; hydromorphone; morphine; methadone; tramadol; tapentadol; fentanyl; buprenorphine; butorphanol; dextromethorphan; meperidine; codeine; etc.)  Legal status: CBD remains a Schedule I drug prohibited for any use. CBD is illegal with one exception. In the [place], CBD has a limited Food and Drug Administration (FDA) approval for the treatment of two specific types of epilepsy disorders. Only one CBD product has been approved by the FDA for this purpose: "Epidiolex". FDA is aware that some companies are marketing products containing cannabis and cannabis-derived compounds in ways that violate the Federal Food, Drug and Cosmetic Act (FD&C Act) and that may put the health and safety of consumers at risk. The FDA, a Federal agency, has not enforced the CBD status since 2018.   Legality: Some manufacturers ship CBD products nationally, which is illegal. Often such products are sold online and are therefore available throughout the country. CBD is openly sold in head shops and health food stores in some states where such sales have not been explicitly legalized. Selling unapproved products with unsubstantiated therapeutic claims is not only a violation of the law, but also can put patients at risk, as these products have not been proven to be safe or effective. Federal illegality makes it difficult to conduct research on CBD.  Reference: "FDA Regulation of Cannabis and Cannabis-Derived Products, Including Cannabidiol (CBD)" - https://www.fda.gov/news-events/public-health-focus/fda-regulation-cannabis-and-cannabis-derived-products-including-cannabidiol-cbd  Warning: CBD is not FDA approved and has not undergo the same manufacturing controls as prescription  drugs.  This means that the purity and safety of available CBD may be questionable. Most of the time, despite manufacturer's claims, it is contaminated with THC (delta-9-tetrahydrocannabinol - the chemical in marijuana responsible for the "HIGH").  When this is the case, the THC contaminant will trigger a positive urine drug screen (UDS) test for Marijuana (carboxy-THC). Because a positive UDS for any illicit substance is a violation of our medication agreement, your opioid analgesics (pain medicine) may be permanently discontinued.  MORE ABOUT CBD  General Information: CBD  is a derivative of the Marijuana (cannabis sativa) plant discovered in 1940. It is one of the 113 identified substances found in Marijuana. It accounts for up to 40% of the plant's extract. As of 2018, preliminary clinical studies on CBD included research for the treatment of anxiety, movement disorders, and pain. CBD is available and consumed in multiple forms, including inhalation of smoke or vapor, as an aerosol spray, and by mouth. It may be supplied as an oil containing CBD, capsules, dried cannabis, or as a liquid solution. CBD is thought not to be as psychoactive as THC (delta-9-tetrahydrocannabinol - the chemical in marijuana responsible for the "HIGH"). Studies suggest that CBD may interact with different biological target receptors in the body, including cannabinoid and other neurotransmitter receptors. As of 2018 the mechanism of action for its biological effects has not been determined.  Side-effects  Adverse reactions: Dry mouth, diarrhea, decreased appetite, fatigue, drowsiness, malaise, weakness, sleep disturbances, and others.  Drug interactions: CBC may interact with other medications such as blood-thinners. (Last update: 02/11/2020) ____________________________________________________________________________________________    

## 2020-04-18 NOTE — Progress Notes (Signed)
Nursing Pain Medication Assessment:  Safety precautions to be maintained throughout the outpatient stay will include: orient to surroundings, keep bed in low position, maintain call bell within reach at all times, provide assistance with transfer out of bed and ambulation.  Medication Inspection Compliance: Pill count conducted under aseptic conditions, in front of the patient. Neither the pills nor the bottle was removed from the patient's sight at any time. Once count was completed pills were immediately returned to the patient in their original bottle.  Medication: Tramadol (Ultram) Pill/Patch Count: 29 of 60 pills remain Pill/Patch Appearance: Markings consistent with prescribed medication Bottle Appearance: Standard pharmacy container. Clearly labeled. Filled Date: 08 / 05 / 2021 Last Medication intake:  Today

## 2020-05-11 ENCOUNTER — Telehealth: Payer: Self-pay | Admitting: Family Medicine

## 2020-05-11 ENCOUNTER — Other Ambulatory Visit: Payer: Self-pay

## 2020-05-11 DIAGNOSIS — G4700 Insomnia, unspecified: Secondary | ICD-10-CM

## 2020-05-11 DIAGNOSIS — F331 Major depressive disorder, recurrent, moderate: Secondary | ICD-10-CM

## 2020-05-11 MED ORDER — DULOXETINE HCL 60 MG PO CPEP: 60.0000 mg | ORAL_CAPSULE | Freq: Every day | ORAL | 0 refills | Status: DC

## 2020-05-11 NOTE — Telephone Encounter (Signed)
PT need a refill  DULoxetine (CYMBALTA) 60 MG capsule [[id number]] eszopiclone (LUNESTA) 2 MG TABS tablet [[id number]]  [id number] Nicholes Rough, [place] - [address] AT [facility] OF [address]  [address] [place] [address]  Phone: [phone] Fax: [phone]

## 2020-05-11 NOTE — Telephone Encounter (Signed)
Requested medication (s) are due for refill today: no  Requested medication (s) are on the active medication list: yes   Last refill: 02/15/2020  Future visit scheduled: yes   Notes to clinic:  this refill cannot be delegated    Requested Prescriptions  Pending Prescriptions Disp Refills   eszopiclone (LUNESTA) 2 MG TABS tablet 90 tablet 0    Sig: Take 1 tablet (2 mg total) by mouth at bedtime. Take immediately before bedtime      Not Delegated - Psychiatry:  Anxiolytics/Hypnotics Failed - 05/11/2020  9:37 AM      Failed - This refill cannot be delegated      Passed - Urine Drug Screen completed in last 360 days      Passed - Valid encounter within last 6 months    Recent Outpatient Visits           2 months ago Anxiety   [facility] Danelle Berry, PA-C   6 months ago Seronegative rheumatoid arthritis [facility])   [facility] Alba Cory, MD   9 months ago Seronegative rheumatoid arthritis [facility])   [facility] Alba Cory, MD   1 year ago Seronegative rheumatoid arthritis [facility])   [facility] Alba Cory, MD   1 year ago Well woman exam   [facility] Alba Cory, MD       Future Appointments             In 1 week Alba Cory, MD [facility], [facility]             Signed Prescriptions Disp Refills   DULoxetine (CYMBALTA) 60 MG capsule 30 capsule 0    Sig: Take 1 capsule (60 mg total) by mouth daily.      Psychiatry: Antidepressants - SNRI Failed - 05/11/2020  9:37 AM      Failed - Last BP in normal range    BP Readings from Last 1 Encounters:  04/18/20 (!) 142/75          Passed - Completed PHQ-2 or PHQ-9 in the last 360 days      Passed - Valid encounter within last 6 months    Recent Outpatient Visits           2 months ago Anxiety   [facility] Danelle Berry, PA-C   6 months ago Seronegative rheumatoid  arthritis [facility])   [facility] Alba Cory, MD   9 months ago Seronegative rheumatoid arthritis [facility])   [facility] Alba Cory, MD   1 year ago Seronegative rheumatoid arthritis [facility])   [facility] Alba Cory, MD   1 year ago Well woman exam   [facility] Alba Cory, MD       Future Appointments             In 1 week Alba Cory, MD [facility], [facility]

## 2020-05-11 NOTE — Addendum Note (Signed)
Addended by: Lisabeth Pick on: 05/11/2020 09:37 AM   Modules accepted: Orders

## 2020-05-15 ENCOUNTER — Other Ambulatory Visit: Payer: Self-pay

## 2020-05-15 DIAGNOSIS — F331 Major depressive disorder, recurrent, moderate: Secondary | ICD-10-CM

## 2020-05-15 DIAGNOSIS — G4700 Insomnia, unspecified: Secondary | ICD-10-CM

## 2020-05-15 MED ORDER — DULOXETINE HCL 60 MG PO CPEP: 60.0000 mg | ORAL_CAPSULE | Freq: Every day | ORAL | 0 refills | Status: DC

## 2020-05-15 MED ORDER — ESZOPICLONE 2 MG PO TABS: 2.0000 mg | ORAL_TABLET | Freq: Every day | ORAL | 0 refills | Status: DC

## 2020-05-15 NOTE — Telephone Encounter (Signed)
Pt called saying she called her pharmacy regarding the Lunesta and Cymbalta and they told her they have not gotten anything fro the office about her requested refills.  CB# [phone]  Pharmacy [facility] [address]

## 2020-05-15 NOTE — Telephone Encounter (Signed)
Pt called the pharmacy today regarding her Alfonso Patten and

## 2020-05-18 NOTE — Progress Notes (Signed)
Name: Candice Guzman   MRN: [id number]    DOB: January 17, 1961   Date:05/21/2020       Progress Note  Subjective  Chief Complaint  Follow up  HPI   SeronegativeRheumatoidArthritis: She has been off Simponi for years because insurance stopped paying for it.She is back on Humiraand since [id number] off methotrexate because of hair loss. She is still under the care of Dr. Gavin Potters.She states she is feeling good today , she had a few flares in 2021 takes prednisone prn. Discussed COVID-19, flu and shingles vaccines today   Insomnia: she has been on Lunesta for a while now since Ambien no longer on formulary, she satest not as good as ambien, but it helps her fall asleep .No side effects of medications   Major Depression: She states mood is affected by constant pain, phq 9 is stable but she states having a good day today. Hanging with a friend and not in pain. . She denies suicidal thoughts or ideation.She is currently on Duloxetine, we tried Seroquel at night but caused her to not rest and had weird dreams.  Phq 9 is 10 today , discussed referral to psychiatrist but not interested   Menopause: she is now on ClonidineBIDshe is also using a topical drysol on axillar - prescribed by Dermatologist. Doing well now, hot flashes has resolved, she occasionally has night sweats   Back pain: she states she has a constant lower back tightness ,she saw Dr. Joanna Puff for back injections for DDD but insurance denied any more coverage.Still takes Tramadol prn, off gabapentin and lyrica because of side effects . 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 Pain in joint of right shoulder 01/18/2019  . Stiffness of right shoulder joint 01/18/2019  . Muscle weakness 01/18/2019  .  Osteoarthritis involving multiple joints 12/07/2018  . Moderate episode of recurrent major depressive disorder (HCC) 08/27/2018  . Foot drop (Right) 08/04/2018  . Abnormal MRI, lumbar spine (06/21/2018) 08/04/2018  . Chronic lumbar radicular pain (L5 dermatome) (Bilateral) (R>L) 05/26/2018  . Long term prescription benzodiazepine use 09/21/2017  . Chronic lower extremity pain (1ry area of Pain) (Bilateral) (R>L) 09/21/2017  . Chronic low back pain (2ry area of Pain) (Bilateral) (R>L) 09/21/2017  . Pharmacologic therapy 09/21/2017  . Problems influencing health status 09/21/2017  . Spondylosis without myelopathy or radiculopathy, lumbosacral region 09/21/2017  . Chronic hip pain (3ry area of Pain) (Bilateral) (R>L) 09/21/2017  . Chronic shoulder pain (Bilateral) (L>R) 09/21/2017  . Grade 1 Anterolisthesis of L5 over S1 & L3 over L4 09/21/2017  . Grade 1 Retrolisthesis L1 over L2 & L2 over L3 09/21/2017  . DDD (degenerative disc disease), lumbar 09/21/2017  . 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 Lumbar facet joint syndrome (Bilateral) (R>L) 09/21/2017  . Chronic musculoskeletal pain 09/21/2017  . Neurogenic pain 09/21/2017  . Lumbar foraminal stenosis (L5-S1) (Bilateral) 09/21/2017  . Lumbar lateral recess stenosis (Bilateral) (B: L4-5) (L>R: L3-4) 09/21/2017  . Lumbar facet hypertrophy (Bilateral: L3-4, L4-5, and L5-S1) 09/21/2017  . Chronic pain syndrome 07/08/2017  . Chronic knee pain (4th area of Pain) (Bilateral) 07/08/2017  . Chronic wrist pain (Fifth Area of Pain) (Bilateral) 07/08/2017  . Disorder of skeletal system 07/08/2017  . Other long  term (current) drug therapy 07/08/2017  . Long term current use of opiate analgesic 07/08/2017  . Other specified health status 07/08/2017  . Chronic pain of multiple joints 05/20/2017  . Insomnia, persistent 12/18/2014  . Depression, major, single episode, moderate (HCC) 12/18/2014  . Menopausal symptom  12/18/2014  . Vitamin D insufficiency 12/18/2014  . Inflammatory arthritis (HCC) 02/23/2014  . Lumbosacral radiculopathy (L5) (Right) 12/12/2013  . Bulge of lumbar disc without myelopathy 06/18/2011    Past Surgical History:  Procedure Laterality Date  . BREAST BIOPSY Left 2010   neg  . CESAREAN SECTION    . FINGER ARTHROPLASTY Left 07/24/2017   Thumb  . GANGLION CYST EXCISION    . METACARPOPHALANGEAL JOINT ARTHROPLASTY Right 06/06/2016   Dr. Dayna Barker   . SHOULDER ARTHROSCOPY WITH ROTATOR CUFF REPAIR Right 02/24/2019   Procedure: RIGHT SHOULDER ARTHROSCOPY WITH SUBACROMIAL DECOMPRESSION, DISTAL CLAVICLE EXCISION, LYSIS OF ADHESIONS,CAPSULOTOMY,MINI OPEN ROTATOR CUFF REPAIR;  Surgeon: Juanell Fairly, MD;  Location: [facility] ORS;  Service: Orthopedics;  Laterality: Right;    Family History  Problem Relation Age of Onset  . Emphysema Mother   . Cancer Father 60       lung  . Cancer Brother        tongue    Social History   Tobacco Use  . Smoking status: Never Smoker  . Smokeless tobacco: Never Used  Substance Use Topics  . Alcohol use: No    Alcohol/week: 0.0 standard drinks     Current Outpatient Medications:  .  ALPRAZolam (XANAX) 0.5 MG tablet, TAKE 1 TABLET(0.5 MG) BY MOUTH AT BEDTIME AS NEEDED, Disp: 30 tablet, Rfl: 0 .  cloNIDine (CATAPRES) 0.1 MG tablet, TAKE 1 TABLET BY MOUTH  TWICE DAILY, Disp: 180 tablet, Rfl: 3 .  DULoxetine (CYMBALTA) 60 MG capsule, Take 1 capsule (60 mg total) by mouth daily., Disp: 30 capsule, Rfl: 0 .  eszopiclone (LUNESTA) 2 MG TABS tablet, Take 1 tablet (2 mg total) by mouth at bedtime. Take immediately before bedtime, Disp: 30 tablet, Rfl: 0 .  HUMIRA PEN 40 MG/0.8ML PNKT, Inject 40 mg into the skin every 14 (fourteen) days. , Disp: , Rfl: 3 .  tiZANidine (ZANAFLEX) 4 MG tablet, Take 1 tablet (4 mg total) by mouth every 8 (eight) hours as needed for muscle spasms. Must last 30 days, Disp: 90 tablet, Rfl: 2 .  traMADol (ULTRAM) 50 MG tablet,  Take 1 tablet (50 mg total) by mouth every 12 (twelve) hours as needed for severe pain. Must last 30 days, Disp: 60 tablet, Rfl: 2  Allergies  Allergen Reactions  . Codeine Itching  . Methotrexate Nausea Only    I personally reviewed active problem list, medication list, allergies, family history, social history, health maintenance, notes from last encounter with the patient/caregiver today.   ROS  Constitutional: Negative for fever or weight change.  Respiratory: Negative for cough and shortness of breath.   Cardiovascular: Negative for chest pain or palpitations.  Gastrointestinal: Negative for abdominal pain, no bowel changes.  Musculoskeletal: Negative for gait problem but has intermittent joint swelling.  Skin: Negative for rash.  Neurological: Negative for dizziness or headache.  No other specific complaints in a complete review of systems (except as listed in HPI above).  Objective  Vitals:   05/21/20 1355  BP: 136/88  Pulse: 99  Resp: 17  Temp: 98.4 F (36.9 C)  TempSrc: Oral  SpO2: 100%  Weight: 146 lb 6.4 oz (66.4 kg)  Height: 5\' 4"  (1.626 m)  Body mass index is 25.13 kg/m.  Physical Exam  Constitutional: Patient appears well-developed and well-nourished.  No distress.  HEENT: head atraumatic, normocephalic, pupils equal and reactive to light,  neck supple Cardiovascular: Normal rate, regular rhythm and normal heart sounds.  No murmur heard. No BLE edema. Pulmonary/Chest: Effort normal and breath sounds normal. No respiratory distress. Abdominal: Soft.  There is no tenderness. Muscular skeletal: some joint deformities  Psychiatric: Patient has a normal mood and affect. behavior is normal. Judgment and thought content normal.  Recent Results (from the past 2160 hour(s))  Comprehensive metabolic panel     Status: Abnormal   Collection Time: 02/24/20 11:07 AM  Result Value Ref Range   Sodium 142 135 - 145 mmol/L   Potassium 3.9 3.5 - 5.1 mmol/L    Chloride 104 98 - 111 mmol/L   CO2 29 22 - 32 mmol/L   Glucose, Bld 111 (H) 70 - 99 mg/dL    Comment: Glucose reference range applies only to samples taken after fasting for at least 8 hours.   BUN 15 6 - 20 mg/dL   Creatinine, Ser 9.47 0.44 - 1.00 mg/dL   Calcium 9.1 8.9 - 09.6 mg/dL   Total Protein 6.7 6.5 - 8.1 g/dL   Albumin 3.6 3.5 - 5.0 g/dL   AST 17 15 - 41 U/L   ALT 13 0 - 44 U/L   Alkaline Phosphatase 111 38 - 126 U/L   Total Bilirubin 0.6 0.3 - 1.2 mg/dL   GFR calc non Af Amer >60 >60 mL/min   GFR calc Af Amer >60 >60 mL/min   Anion gap 9 5 - 15    Comment: Performed at [facility], [address], [place], [place] [address]  Urine Drug Screen, Qualitative ([facility] only)     Status: None   Collection Time: 02/24/20 11:13 AM  Result Value Ref Range   Tricyclic, Ur Screen NONE DETECTED NONE DETECTED   Amphetamines, Ur Screen NONE DETECTED NONE DETECTED   MDMA (Ecstasy)Ur Screen NONE DETECTED NONE DETECTED   Cocaine Metabolite,Ur [place] NONE DETECTED NONE DETECTED   Opiate, Ur Screen NONE DETECTED NONE DETECTED   Phencyclidine (PCP) Ur S NONE DETECTED NONE DETECTED   Cannabinoid 50 Ng, Ur [place] NONE DETECTED NONE DETECTED   Barbiturates, Ur Screen NONE DETECTED NONE DETECTED   Benzodiazepine, Ur Scrn NONE DETECTED NONE DETECTED   Methadone Scn, Ur NONE DETECTED NONE DETECTED    Comment: (NOTE) Tricyclics + metabolites, urine    Cutoff 1000 ng/mL Amphetamines + metabolites, urine  Cutoff 1000 ng/mL MDMA (Ecstasy), urine              Cutoff 500 ng/mL Cocaine Metabolite, urine          Cutoff 300 ng/mL Opiate + metabolites, urine        Cutoff 300 ng/mL Phencyclidine (PCP), urine         Cutoff 25 ng/mL Cannabinoid, urine                 Cutoff 50 ng/mL Barbiturates + metabolites, urine  Cutoff 200 ng/mL Benzodiazepine, urine              Cutoff 200 ng/mL Methadone, urine                   Cutoff 300 ng/mL  The urine drug screen provides only a preliminary,  unconfirmed analytical test result and should not be used for non-medical purposes. Clinical consideration and professional judgment should  be applied to any positive drug screen result due to possible interfering substances. A more specific alternate chemical method must be used in order to obtain a confirmed analytical result. Gas chromatography / mass spectrometry (GC/MS) is the preferred confirm atory method. Performed at [facility], [address] [place], [place] [address]   Lipase, blood     Status: None   Collection Time: 03/09/20 12:18 PM  Result Value Ref Range   Lipase 18 11 - 51 U/L    Comment: Performed at [facility], [address] [place], [place] [address]  Comprehensive metabolic panel     Status: Abnormal   Collection Time: 03/09/20 12:18 PM  Result Value Ref Range   Sodium 142 135 - 145 mmol/L   Potassium 3.1 (L) 3.5 - 5.1 mmol/L   Chloride 104 98 - 111 mmol/L   CO2 27 22 - 32 mmol/L   Glucose, Bld 107 (H) 70 - 99 mg/dL    Comment: Glucose reference range applies only to samples taken after fasting for at least 8 hours.   BUN 10 6 - 20 mg/dL   Creatinine, Ser 9.52 0.44 - 1.00 mg/dL   Calcium 9.5 8.9 - 84.1 mg/dL   Total Protein 7.4 6.5 - 8.1 g/dL   Albumin 3.8 3.5 - 5.0 g/dL   AST 17 15 - 41 U/L   ALT 13 0 - 44 U/L   Alkaline Phosphatase 105 38 - 126 U/L   Total Bilirubin 0.5 0.3 - 1.2 mg/dL   GFR calc non Af Amer >60 >60 mL/min   GFR calc Af Amer >60 >60 mL/min   Anion gap 11 5 - 15    Comment: Performed at [facility], [address] [place], [place] [address]  CBC     Status: Abnormal   Collection Time: 03/09/20 12:18 PM  Result Value Ref Range   WBC 10.6 (H) 4.0 - 10.5 K/uL   RBC 4.42 3.87 - 5.11 MIL/uL   Hemoglobin 12.8 12.0 - 15.0 g/dL   HCT 10.2 36 - 46 %   MCV 89.1 80.0 - 100.0 fL   MCH 29.0 26.0 - 34.0 pg   MCHC 32.5 30.0 - 36.0 g/dL   RDW 72.5 36.6 - 44.0 %   Platelets 334 150 - 400 K/uL   nRBC 0.0  0.0 - 0.2 %    Comment: Performed at [facility], [address] [place], [place] [address]  Urinalysis, Complete w Microscopic     Status: Abnormal   Collection Time: 03/09/20  2:00 PM  Result Value Ref Range   Color, Urine YELLOW (A) YELLOW   APPearance CLOUDY (A) CLEAR   Specific Gravity, Urine 1.020 1.005 - 1.030   pH 5.0 5.0 - 8.0   Glucose, UA NEGATIVE NEGATIVE mg/dL   Hgb urine dipstick LARGE (A) NEGATIVE   Bilirubin Urine NEGATIVE NEGATIVE   Ketones, ur NEGATIVE NEGATIVE mg/dL   Protein, ur 30 (A) NEGATIVE mg/dL   Nitrite NEGATIVE NEGATIVE   Leukocytes,Ua LARGE (A) NEGATIVE   RBC / HPF 21-50 0 - 5 RBC/hpf   WBC, UA 21-50 0 - 5 WBC/hpf   Bacteria, UA RARE (A) NONE SEEN   Squamous Epithelial / LPF 0-5 0 - 5   Mucus PRESENT     Comment: Performed at [facility], [address] [place], [place] [address]  SARS Coronavirus 2 by RT PCR (hospital order, performed in [facility] hospital lab) Nasopharyngeal Nasopharyngeal Swab  Status: None   Collection Time: 03/09/20  2:00 PM   Specimen: Nasopharyngeal Swab  Result Value Ref Range   SARS Coronavirus 2 NEGATIVE NEGATIVE    Comment: (NOTE) SARS-CoV-2 target nucleic acids are NOT DETECTED.  The SARS-CoV-2 RNA is generally detectable in upper and lower respiratory specimens during the acute phase of infection. The lowest concentration of SARS-CoV-2 viral copies this assay can detect is 250 copies / mL. A negative result does not preclude SARS-CoV-2 infection and should not be used as the sole basis for treatment or other patient management decisions.  A negative result may occur with improper specimen collection / handling, submission of specimen other than nasopharyngeal swab, presence of viral mutation(s) within the areas targeted by this assay, and inadequate number of viral copies (<250 copies / mL). A negative result must be combined with clinical observations, patient history, and epidemiological  information.  Fact Sheet for Patients:   BoilerBrush.com.cy  Fact Sheet for Healthcare Providers: https://pope.com/  This test is not yet approved or  cleared by the [place] FDA and has been authorized for detection and/or diagnosis of SARS-CoV-2 by FDA under an Emergency Use Authorization (EUA).  This EUA will remain in effect (meaning this test can be used) for the duration of the COVID-19 declaration under Section 564(b)(1) of the Act, 21 U.S.C. section 360bbb-3(b)(1), unless the authorization is terminated or revoked sooner.  Performed at [facility], [address], [place], [place] [address]   Urine Culture     Status: None   Collection Time: 03/09/20  2:00 PM   Specimen: Urine, Random  Result Value Ref Range   Specimen Description      URINE, RANDOM Performed at [facility], [address], [place], [place] [address]    Special Requests      NONE Performed at [facility], [address] [place], [place] [address]    Culture      NO GROWTH Performed at [facility], [address], [place], [place] [address]    Report Status 03/10/2020 FINAL       PHQ2/9: Depression screen [facility] 2/9 05/21/2020 02/15/2020 11/03/2019 08/05/2019 05/04/2019  Decreased Interest Down, Depressed, Hopeless PHQ - 2 Score Altered sleeping Tired, decreased energy Change in appetite 0 0 1 0 2  Feeling bad or failure about yourself  0 0 0 0 1  Trouble concentrating 1 0 1 0 1  Moving slowly or fidgety/restless 0 0 0 0 0  Suicidal thoughts 0 0 0 0 0  PHQ-9 Score Difficult doing work/chores Not difficult at all Somewhat difficult Somewhat difficult Not difficult at all Somewhat difficult  Some recent data might be hidden    phq 9 is positive   Fall Risk: Fall Risk  05/21/2020 04/18/2020 02/15/2020 11/03/2019 08/05/2019  Falls in  the past year? 0 0 0 0 0  Comment - - - - -  Number falls in past yr: 0 - 0 0 0  Injury with Fall? 0 - 0 0 0  Comment - - - - -  Risk for fall due to : - No Fall Risks - - -  Risk for fall due to: Comment - - - - -  Follow up - Falls evaluation completed Falls evaluation completed Falls  evaluation completed -     Functional Status Survey: Is the patient deaf or have difficulty hearing?: No Does the patient have difficulty seeing, even when wearing glasses/contacts?: No Does the patient have difficulty concentrating, remembering, or making decisions?: No Does the patient have difficulty walking or climbing stairs?: No Does the patient have difficulty dressing or bathing?: No Does the patient have difficulty doing errands alone such as visiting a doctor's office or shopping?: No    Assessment & Plan  1. Depression, major, recurrent, moderate (HCC)  - DULoxetine (CYMBALTA) 60 MG capsule; Take 1 capsule (60 mg total) by mouth daily.  Dispense: 90 capsule; Refill: 1  2. Chronic pain syndrome   3. Insomnia, persistent  - eszopiclone (LUNESTA) 2 MG TABS tablet; Take 1 tablet (2 mg total) by mouth at bedtime. Take immediately before bedtime  Dispense: 90 tablet; Refill: 0  4. Seronegative rheumatoid arthritis (HCC)  Under the care of Dr. Gavin Potters   5. Anxiety   6. Vitamin B12 deficiency  Continue supplementation  7. Vitamin D deficiency  Continue supplementation   8. Reynolds syndrome (HCC)  Stable   9. Chronic low back pain without sciatica, unspecified back pain laterality  Under the care of Dr. Idalia Needle   10. Depression, major, recurrent, moderate (HCC)  - DULoxetine (CYMBALTA) 60 MG capsule; Take 1 capsule (60 mg total) by mouth daily.  Dispense: 90 capsule; Refill: 1  11. Insomnia, persistent  - eszopiclone (LUNESTA) 2 MG TABS tablet; Take 1 tablet (2 mg total) by mouth at bedtime. Take immediately before bedtime  Dispense: 90 tablet; Refill: 0  12. Need  for shingles vaccine  - Varicella-zoster vaccine IM  13. Needs flu shot  Today

## 2020-05-21 ENCOUNTER — Encounter: Payer: Self-pay | Admitting: Family Medicine

## 2020-05-21 ENCOUNTER — Ambulatory Visit: Payer: Managed Care, Other (non HMO) | Admitting: Family Medicine

## 2020-05-21 ENCOUNTER — Other Ambulatory Visit: Payer: Self-pay

## 2020-05-21 VITALS — BP 136/88 | HR 99 | Temp 98.4°F | Resp 17 | Ht 64.0 in | Wt 146.4 lb

## 2020-05-21 DIAGNOSIS — F331 Major depressive disorder, recurrent, moderate: Secondary | ICD-10-CM

## 2020-05-21 DIAGNOSIS — G8929 Other chronic pain: Secondary | ICD-10-CM

## 2020-05-21 DIAGNOSIS — F419 Anxiety disorder, unspecified: Secondary | ICD-10-CM

## 2020-05-21 DIAGNOSIS — M0600 Rheumatoid arthritis without rheumatoid factor, unspecified site: Secondary | ICD-10-CM

## 2020-05-21 DIAGNOSIS — G894 Chronic pain syndrome: Secondary | ICD-10-CM | POA: Diagnosis not present

## 2020-05-21 DIAGNOSIS — E538 Deficiency of other specified B group vitamins: Secondary | ICD-10-CM

## 2020-05-21 DIAGNOSIS — Z23 Encounter for immunization: Secondary | ICD-10-CM | POA: Diagnosis not present

## 2020-05-21 DIAGNOSIS — E559 Vitamin D deficiency, unspecified: Secondary | ICD-10-CM

## 2020-05-21 DIAGNOSIS — K743 Primary biliary cirrhosis: Secondary | ICD-10-CM

## 2020-05-21 DIAGNOSIS — L940 Localized scleroderma [morphea]: Secondary | ICD-10-CM

## 2020-05-21 DIAGNOSIS — G4700 Insomnia, unspecified: Secondary | ICD-10-CM | POA: Diagnosis not present

## 2020-05-21 DIAGNOSIS — M545 Low back pain, unspecified: Secondary | ICD-10-CM

## 2020-05-21 MED ORDER — ESZOPICLONE 2 MG PO TABS: 2.0000 mg | ORAL_TABLET | Freq: Every day | ORAL | 0 refills | Status: DC

## 2020-05-21 MED ORDER — DULOXETINE HCL 60 MG PO CPEP: 60.0000 mg | ORAL_CAPSULE | Freq: Every day | ORAL | 1 refills | Status: DC

## 2020-05-24 ENCOUNTER — Other Ambulatory Visit: Payer: Self-pay | Admitting: Family Medicine

## 2020-05-24 DIAGNOSIS — N951 Menopausal and female climacteric states: Secondary | ICD-10-CM

## 2020-07-15 NOTE — Progress Notes (Deleted)
Called and canceled.

## 2020-07-16 ENCOUNTER — Other Ambulatory Visit: Payer: Self-pay | Admitting: Family Medicine

## 2020-07-16 ENCOUNTER — Encounter: Payer: Managed Care, Other (non HMO) | Admitting: Pain Medicine

## 2020-07-16 DIAGNOSIS — Z1231 Encounter for screening mammogram for malignant neoplasm of breast: Secondary | ICD-10-CM

## 2020-07-18 ENCOUNTER — Other Ambulatory Visit: Payer: Self-pay

## 2020-07-18 ENCOUNTER — Ambulatory Visit
Admission: RE | Admit: 2020-07-18 | Discharge: 2020-07-18 | Disposition: A | Discharge status: Home / Self Care | Payer: Managed Care, Other (non HMO) | Source: Ambulatory Visit | Attending: Family Medicine | Admitting: Family Medicine

## 2020-07-18 DIAGNOSIS — Z1231 Encounter for screening mammogram for malignant neoplasm of breast: Secondary | ICD-10-CM | POA: Insufficient documentation

## 2020-09-17 NOTE — Progress Notes (Signed)
Name: Candice Guzman   MRN: [id number]    DOB: 04/23/1961   Date:09/18/2020       Progress Note  Subjective  Chief Complaint  Follow up   HPI   SeronegativeRheumatoidArthritis: She has been off Simponi for years because insurance stopped paying for it.She is back on Humiraand since [id number] off methotrexate because of hair loss. She is still under the care of Dr. Gavin Potters.She states over the past three weeks she noticed a deeper aching sensation on both lower legs and knows behind her knees, feeling better but still has knots on popliteal fossa, no redness or swelling   Insomnia: she has been on Lunesta for a while now since Ambien no longer on formulary, she states she has been sleeping okay with Lunesta, gets about 6 hours of sleep on medication but interrupted sleep because of body aches.   Major Depression: She states mood is affected by constant pain, phq 9 is stable but she states having a good day today. Hanging with a friend and not in pain. . She denies suicidal thoughts or ideation.She is currently on Duloxetine, we tried Seroquel at night but caused her to not rest and had weird dreams.  Phq 9 is higher, not feeling well, lack of energy, severe fatigue but she does not think it is depression " I just don't feel right". She states working 10 hours day, short staffed and not sure if that is the cause, discussed filling out FMLA forms and she will think about it  Menopause: she is now on ClonidineBIDshe is also using a topical drysol on axillar - prescribed by Dermatologist. Doing well now, hot flashes has resolved, she occasionally has night sweats  Unchanged   Back pain: she states she has a constant lower back tightness ,she saw Dr. Joanna Puff for back injections for DDD but insurance denied any more coverage.Still takes Tramadol prn, off gabapentin and lyrica because of side effects . 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 She also had one episode of chest tightness while in her kitchen on Saturday, it radiated to both arm, she felt a chill and also felt dizziness when she stood up. She did not call 911. Explained her last LDL was very high and when in doubt to call 911. Offered to send [facility] to [facility] for stat labs but she prefers going to [facility]    Patient Active Problem List   Diagnosis Date Noted  . 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 Pain in joint of right shoulder 01/18/2019  . Stiffness of right shoulder joint 01/18/2019  . Muscle weakness 01/18/2019  . Osteoarthritis involving multiple joints 12/07/2018  . Moderate episode of recurrent major depressive disorder (HCC) 08/27/2018  . Foot drop (Right) 08/04/2018  . Abnormal MRI, lumbar spine (06/21/2018) 08/04/2018  . Chronic lumbar radicular pain (L5 dermatome) (Bilateral) (R>L) 05/26/2018  . Long term prescription benzodiazepine use 09/21/2017  . Chronic lower extremity pain (1ry area of Pain) (Bilateral) (R>L) 09/21/2017  . Chronic low back pain (2ry area of Pain) (Bilateral) (R>L) 09/21/2017  . Pharmacologic therapy 09/21/2017  . Problems influencing health status 09/21/2017  . Spondylosis without myelopathy or radiculopathy, lumbosacral region 09/21/2017  . Chronic hip pain (3ry area of  Pain) (Bilateral) (R>L) 09/21/2017  . Chronic shoulder pain (Bilateral) (L>R) 09/21/2017  . Grade 1 Anterolisthesis of L5 over S1 & L3 over L4 09/21/2017  . Grade 1 Retrolisthesis L1 over L2 & L2 over L3 09/21/2017  . DDD (degenerative disc disease), lumbar 09/21/2017  . 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 Lumbar facet joint syndrome (Bilateral) (R>L) 09/21/2017  . Chronic musculoskeletal pain 09/21/2017  .  Neurogenic pain 09/21/2017  . Lumbar foraminal stenosis (L5-S1) (Bilateral) 09/21/2017  . Lumbar lateral recess stenosis (Bilateral) (B: L4-5) (L>R: L3-4) 09/21/2017  . Lumbar facet hypertrophy (Bilateral: L3-4, L4-5, and L5-S1) 09/21/2017  . Chronic pain syndrome 07/08/2017  . Chronic knee pain (4th area of Pain) (Bilateral) 07/08/2017  . Chronic wrist pain (Fifth Area of Pain) (Bilateral) 07/08/2017  . Disorder of skeletal system 07/08/2017  . Other long term (current) drug therapy 07/08/2017  . Long term current use of opiate analgesic 07/08/2017  . Other specified health status 07/08/2017  . Chronic pain of multiple joints 05/20/2017  . Insomnia, persistent 12/18/2014  . Depression, major, single episode, moderate (HCC) 12/18/2014  . Menopausal symptom 12/18/2014  . Vitamin D insufficiency 12/18/2014  . Inflammatory arthritis (HCC) 02/23/2014  . Lumbosacral radiculopathy (L5) (Right) 12/12/2013  . Bulge of lumbar disc without myelopathy 06/18/2011    Past Surgical History:  Procedure Laterality Date  . BREAST BIOPSY Left 2010   neg  . CESAREAN SECTION    . FINGER ARTHROPLASTY Left 07/24/2017   Thumb  . GANGLION CYST EXCISION    . METACARPOPHALANGEAL JOINT ARTHROPLASTY Right 06/06/2016   Dr. Dayna Barker   . SHOULDER ARTHROSCOPY WITH ROTATOR CUFF REPAIR Right 02/24/2019   Procedure: RIGHT SHOULDER ARTHROSCOPY WITH SUBACROMIAL DECOMPRESSION, DISTAL CLAVICLE EXCISION, LYSIS OF ADHESIONS,CAPSULOTOMY,MINI OPEN ROTATOR CUFF REPAIR;  Surgeon: Juanell Fairly, MD;  Location: [facility] ORS;  Service: Orthopedics;  Laterality: Right;    Family History  Problem Relation Age of Onset  . Emphysema Mother   . Cancer Father 60       lung  . Cancer Brother        tongue    Social History   Tobacco Use  . Smoking status: Never Smoker  . Smokeless tobacco: Never Used  Substance Use Topics  . Alcohol use: No    Alcohol/week: 0.0 standard drinks     Current Outpatient Medications:  .   ALPRAZolam (XANAX) 0.5 MG tablet, TAKE 1 TABLET(0.5 MG) BY MOUTH AT BEDTIME AS NEEDED, Disp: 30 tablet, Rfl: 0 .  cloNIDine (CATAPRES) 0.1 MG tablet, TAKE 1 TABLET BY MOUTH  TWICE DAILY, Disp: 180 tablet, Rfl: 1 .  DULoxetine (CYMBALTA) 60 MG capsule, Take 1 capsule (60 mg total) by mouth daily., Disp: 90 capsule, Rfl: 1 .  eszopiclone (LUNESTA) 2 MG TABS tablet, Take 1 tablet (2 mg total) by mouth at bedtime. Take immediately before bedtime, Disp: 90 tablet, Rfl: 0 .  HUMIRA PEN 40 MG/0.8ML PNKT, Inject 40 mg into the skin every 14 (fourteen) days. , Disp: , Rfl: 3 .  tiZANidine (ZANAFLEX) 4 MG tablet, Take 1 tablet (4 mg total) by mouth every 8 (eight) hours as needed for muscle spasms. Must last 30 days, Disp: 90 tablet, Rfl: 2 .  traMADol (ULTRAM) 50 MG tablet, Take 1 tablet (50 mg total) by mouth every 12 (twelve) hours as needed for severe pain. Must last 30 days, Disp: 60 tablet, Rfl: 2  Allergies  Allergen Reactions  . Codeine Itching  . Methotrexate Nausea  Only    I personally reviewed active problem list, medication list, allergies, family history, social history, health maintenance with the patient/caregiver today.   ROS  Constitutional: Negative for fever or weight change.  Respiratory: Negative for cough , positive  shortness of breath.   Cardiovascular: Negative for chest pain or palpitations.  Gastrointestinal: Negative for abdominal pain, no bowel changes.  Musculoskeletal: Negative for gait problem , positive for intermittent  joint swelling.  Skin: Negative for rash.  Neurological: Negative for dizziness or headache.  No other specific complaints in a complete review of systems (except as listed in HPI above).  Objective  Vitals:   09/18/20 1336  BP: 132/82  Pulse: 93  Resp: 16  Temp: 98.2 F (36.8 C)  TempSrc: Oral  SpO2: 99%  Weight: 141 lb (64 kg)  Height: 5\' 4"  (1.626 m)    Body mass index is 24.2 kg/m.  Physical Exam  Constitutional: Patient  appears well-developed and well-nourished.  No distress.  HEENT: head atraumatic, normocephalic, pupils equal and reactive to light,  neck supple Cardiovascular: Normal rate, regular rhythm and normal heart sounds.  No murmur heard. No BLE edema. Pulmonary/Chest: Effort normal and breath sounds normal. No respiratory distress. Abdominal: Soft.  There is no tenderness. Psychiatric: Patient has a normal mood and affect. behavior is normal. Judgment and thought content normal.  PHQ2/9: Depression screen [facility] 2/9 09/18/2020 05/21/2020 02/15/2020 11/03/2019 08/05/2019  Decreased Interest 3 1 3 1 2   Down, Depressed, Hopeless 3 2 3 1 1   PHQ - 2 Score 6 3 6 2 3   Altered sleeping 0 3 3 3 3   Tired, decreased energy 3 3 3 1 2   Change in appetite 3 0 0 1 0  Feeling bad or failure about yourself  0 0 0 0 0  Trouble concentrating 0 1 0 1 0  Moving slowly or fidgety/restless 0 0 0 0 0  Suicidal thoughts 0 0 0 0 0  PHQ-9 Score 12 10 12 8 8   Difficult doing work/chores - Not difficult at all Somewhat difficult Somewhat difficult Not difficult at all  Some recent data might be hidden    phq 9 is positive   Fall Risk: Fall Risk  09/18/2020 05/21/2020 04/18/2020 02/15/2020 11/03/2019  Falls in the past year? 0 0 0 0 0  Comment - - - - -  Number falls in past yr: 0 0 - 0 0  Injury with Fall? 0 0 - 0 0  Comment - - - - -  Risk for fall due to : - - No Fall Risks - -  Risk for fall due to: Comment - - - - -  Follow up - - Falls evaluation completed Falls evaluation completed Falls evaluation completed     Functional Status Survey: Is the patient deaf or have difficulty hearing?: No Does the patient have difficulty seeing, even when wearing glasses/contacts?: No Does the patient have difficulty concentrating, remembering, or making decisions?: No Does the patient have difficulty walking or climbing stairs?: No Does the patient have difficulty dressing or bathing?: No Does the patient have difficulty  doing errands alone such as visiting a doctor's office or shopping?: No    Assessment & Plan  1. Fatigue, unspecified type  - Novel Coronavirus, NAA ([facility]) - Comprehensive metabolic panel - TSH  2. SOB (shortness of breath)  - Novel Coronavirus, NAA ([facility]) - DG Chest 2 View; Future  3. Seronegative rheumatoid arthritis (HCC)   4. Reynolds syndrome (HCC)  5. Chronic pain syndrome   6. Vitamin B12 deficiency  - Vitamin B12  7. Vitamin D deficiency  - VITAMIN D 25 Hydroxy (Vit-D Deficiency, Fractures)  8. Depression, major, recurrent, moderate (HCC)  - TSH  9. Pure hypercholesterolemia  - Lipid panel  10. Chest tightness  - DG Chest 2 View; Future - EKG 12-Lead - CBC with Differential/Platelet - CK total and CKMB (cardiac)not at [facility] - Troponin T  Change in EKG with Qwaves on V1 and V2, discussed with patient, explained importance of calling 911 if recurrence of chest pain, referral to cardiologist, start 81 mg aspirin, get labs done

## 2020-09-18 ENCOUNTER — Encounter: Payer: Self-pay | Admitting: Family Medicine

## 2020-09-18 ENCOUNTER — Ambulatory Visit: Payer: Managed Care, Other (non HMO) | Admitting: Family Medicine

## 2020-09-18 ENCOUNTER — Other Ambulatory Visit: Payer: Self-pay

## 2020-09-18 ENCOUNTER — Ambulatory Visit
Admission: RE | Admit: 2020-09-18 | Discharge: 2020-09-18 | Disposition: A | Discharge status: Home / Self Care | Payer: Managed Care, Other (non HMO) | Source: Ambulatory Visit | Attending: Family Medicine | Admitting: Family Medicine

## 2020-09-18 ENCOUNTER — Ambulatory Visit
Admission: RE | Admit: 2020-09-18 | Discharge: 2020-09-18 | Disposition: A | Discharge status: Home / Self Care | Payer: Managed Care, Other (non HMO) | Attending: Family Medicine | Admitting: Family Medicine

## 2020-09-18 VITALS — BP 132/82 | HR 93 | Temp 98.2°F | Resp 16 | Ht 64.0 in | Wt 141.0 lb

## 2020-09-18 DIAGNOSIS — M0600 Rheumatoid arthritis without rheumatoid factor, unspecified site: Secondary | ICD-10-CM

## 2020-09-18 DIAGNOSIS — R5383 Other fatigue: Secondary | ICD-10-CM

## 2020-09-18 DIAGNOSIS — R0789 Other chest pain: Secondary | ICD-10-CM

## 2020-09-18 DIAGNOSIS — R0602 Shortness of breath: Secondary | ICD-10-CM

## 2020-09-18 DIAGNOSIS — G894 Chronic pain syndrome: Secondary | ICD-10-CM

## 2020-09-18 DIAGNOSIS — E7800 Pure hypercholesterolemia, unspecified: Secondary | ICD-10-CM

## 2020-09-18 DIAGNOSIS — E559 Vitamin D deficiency, unspecified: Secondary | ICD-10-CM

## 2020-09-18 DIAGNOSIS — E538 Deficiency of other specified B group vitamins: Secondary | ICD-10-CM

## 2020-09-18 DIAGNOSIS — F331 Major depressive disorder, recurrent, moderate: Secondary | ICD-10-CM

## 2020-09-18 DIAGNOSIS — K743 Primary biliary cirrhosis: Secondary | ICD-10-CM

## 2020-09-18 DIAGNOSIS — L940 Localized scleroderma [morphea]: Secondary | ICD-10-CM

## 2020-09-19 ENCOUNTER — Other Ambulatory Visit: Payer: Self-pay | Admitting: Family Medicine

## 2020-09-19 DIAGNOSIS — R748 Abnormal levels of other serum enzymes: Secondary | ICD-10-CM

## 2020-09-19 LAB — CBC WITH DIFFERENTIAL/PLATELET
Basophils Absolute: 0.1 10*3/uL (ref 0.0–0.2)
Basos: 1 %
EOS (ABSOLUTE): 0.5 10*3/uL — ABNORMAL HIGH (ref 0.0–0.4)
Eos: 5 %
Hematocrit: 39.8 % (ref 34.0–46.6)
Hemoglobin: 13.4 g/dL (ref 11.1–15.9)
Immature Grans (Abs): 0 10*3/uL (ref 0.0–0.1)
Immature Granulocytes: 0 %
Lymphocytes Absolute: 2.9 10*3/uL (ref 0.7–3.1)
Lymphs: 30 %
MCH: 28.9 pg (ref 26.6–33.0)
MCHC: 33.7 g/dL (ref 31.5–35.7)
MCV: 86 fL (ref 79–97)
Monocytes Absolute: 0.5 10*3/uL (ref 0.1–0.9)
Monocytes: 6 %
Neutrophils Absolute: 5.5 10*3/uL (ref 1.4–7.0)
Neutrophils: 58 %
Platelets: 376 10*3/uL (ref 150–450)
RBC: 4.63 x10E6/uL (ref 3.77–5.28)
RDW: 12.6 % (ref 11.7–15.4)
WBC: 9.4 10*3/uL (ref 3.4–10.8)

## 2020-09-19 LAB — COMPREHENSIVE METABOLIC PANEL
ALT: 10 IU/L (ref 0–32)
AST: 19 IU/L (ref 0–40)
Albumin/Globulin Ratio: 1.5 (ref 1.2–2.2)
Albumin: 4.3 g/dL (ref 3.8–4.9)
Alkaline Phosphatase: 169 IU/L — ABNORMAL HIGH (ref 44–121)
BUN/Creatinine Ratio: 13 (ref 9–23)
BUN: 12 mg/dL (ref 6–24)
Bilirubin Total: <0.2 mg/dL (ref 0.0–1.2)
CO2: 23 mmol/L (ref 20–29)
Calcium: 9.4 mg/dL (ref 8.7–10.2)
Chloride: 102 mmol/L (ref 96–106)
Creatinine, Ser: 0.89 mg/dL (ref 0.57–1.00)
Globulin, Total: 2.9 g/dL (ref 1.5–4.5)
Glucose: 98 mg/dL (ref 65–99)
Potassium: 4.3 mmol/L (ref 3.5–5.2)
Sodium: 139 mmol/L (ref 134–144)
Total Protein: 7.2 g/dL (ref 6.0–8.5)
eGFR: 75 mL/min/{1.73_m2} (ref >59)

## 2020-09-19 LAB — TROPONIN T: Troponin T (Highly Sensitive): <6 ng/L (ref 0–14)

## 2020-09-19 LAB — LIPID PANEL
Chol/HDL Ratio: 4.6 ratio — ABNORMAL HIGH (ref 0.0–4.4)
Cholesterol, Total: 265 mg/dL — ABNORMAL HIGH (ref 100–199)
HDL: 57 mg/dL (ref >39)
LDL Chol Calc (NIH): 178 mg/dL — ABNORMAL HIGH (ref 0–99)
Triglycerides: 162 mg/dL — ABNORMAL HIGH (ref 0–149)
VLDL Cholesterol Cal: 30 mg/dL (ref 5–40)

## 2020-09-19 LAB — SARS-COV-2, NAA 2 DAY TAT

## 2020-09-19 LAB — VITAMIN D 25 HYDROXY (VIT D DEFICIENCY, FRACTURES): Vit D, 25-Hydroxy: 19.6 ng/mL — ABNORMAL LOW (ref 30.0–100.0)

## 2020-09-19 LAB — CK TOTAL AND CKMB (NOT AT ARMC)
CK-MB Index: 1.2 ng/mL (ref 0.0–5.3)
Total CK: 60 U/L (ref 32–182)

## 2020-09-19 LAB — TSH: TSH: 2.13 u[IU]/mL (ref 0.450–4.500)

## 2020-09-19 LAB — VITAMIN B12: Vitamin B-12: 414 pg/mL (ref 232–1245)

## 2020-09-19 LAB — NOVEL CORONAVIRUS, NAA: SARS-CoV-2, NAA: NOT DETECTED

## 2020-09-21 ENCOUNTER — Other Ambulatory Visit: Payer: Self-pay | Admitting: Family Medicine

## 2020-09-21 DIAGNOSIS — G4700 Insomnia, unspecified: Secondary | ICD-10-CM

## 2020-09-21 NOTE — Telephone Encounter (Signed)
Requested medication (s) are due for refill today:   Provider to review  Requested medication (s) are on the active medication list:   Yes  Future visit scheduled:   Yes   Last ordered: 05/21/2020 #90, 0 refills  Non delegated refill   Requested Prescriptions  Pending Prescriptions Disp Refills   eszopiclone (LUNESTA) 2 MG TABS tablet [Pharmacy Med Name: ESZOPICLONE 2MG  TABLETS] 90 tablet     Sig: TAKE 1 TABLET BY MOUTH EVERY NIGHT AT BEDTIME. TAKE IMMEDIATELY BEFORE BEDTIME      Not Delegated - Psychiatry:  Anxiolytics/Hypnotics Failed - 09/21/2020 10:30 AM      Failed - This refill cannot be delegated      Passed - Urine Drug Screen completed in last 360 days      Passed - Valid encounter within last 6 months    Recent Outpatient Visits           3 days ago Fatigue, unspecified type   [facility], MD   4 months ago Depression, major, recurrent, moderate [facility])   [facility], MD   7 months ago Anxiety   [facility] [place], [place]-C   10 months ago Seronegative rheumatoid arthritis [facility])   [facility], MD   1 year ago Seronegative rheumatoid arthritis [facility])   [facility], MD       Future Appointments             In 3 weeks Agbor-Etang, Alba Cory, MD [facility]   In 3 months [facility], MD [facility], [facility]

## 2020-10-15 ENCOUNTER — Ambulatory Visit: Payer: Managed Care, Other (non HMO) | Admitting: Cardiology

## 2020-10-15 ENCOUNTER — Other Ambulatory Visit: Payer: Self-pay

## 2020-10-15 ENCOUNTER — Encounter: Payer: Self-pay | Admitting: Cardiology

## 2020-10-15 VITALS — BP 150/110 | HR 110 | Ht 64.0 in | Wt 139.0 lb

## 2020-10-15 DIAGNOSIS — R072 Precordial pain: Secondary | ICD-10-CM

## 2020-10-15 DIAGNOSIS — I10 Essential (primary) hypertension: Secondary | ICD-10-CM

## 2020-10-15 DIAGNOSIS — E7800 Pure hypercholesterolemia, unspecified: Secondary | ICD-10-CM

## 2020-10-15 MED ORDER — IRBESARTAN 75 MG PO TABS: 75.0000 mg | ORAL_TABLET | Freq: Every day | ORAL | 5 refills | Status: DC

## 2020-10-15 MED ORDER — ATORVASTATIN CALCIUM 20 MG PO TABS: 20.0000 mg | ORAL_TABLET | Freq: Every day | ORAL | 5 refills | Status: DC

## 2020-10-15 NOTE — Patient Instructions (Signed)
Medication Instructions:   Your physician has recommended you make the following change in your medication:   1.  START Irbesartan (Avapro) 75 MG once daily.  2.  START Lipitor (Atorvastatin) 20 MG once daily.   *If you need a refill on your cardiac medications before your next appointment, please call your pharmacy*   Lab Work: None ordered If you have labs (blood work) drawn today and your tests are completely normal, you will receive your results only by: Marland Kitchen MyChart Message (if you have MyChart) OR . A paper copy in the mail If you have any lab test that is abnormal or we need to change your treatment, we will call you to review the results.   Testing/Procedures:  1.  Your physician has requested that you have an echocardiogram. Echocardiography is a painless test that uses sound waves to create images of your heart. It provides your doctor with information about the size and shape of your heart and how well your heart's chambers and valves are working. This procedure takes approximately one hour. There are no restrictions for this procedure.  2.  [facility] MYOVIEW       Your caregiver has ordered a Stress Test with nuclear imaging. The purpose of this test is to evaluate the blood supply to your heart muscle. This procedure is referred to as a "Non-Invasive Stress Test." This is because other than having an IV started in your vein, nothing is inserted or "invades" your body. Cardiac stress tests are done to find areas of poor blood flow to the heart by determining the extent of coronary artery disease (CAD). Some patients exercise on a treadmill, which naturally increases the blood flow to your heart, while others who are  unable to walk on a treadmill due to physical limitations have a pharmacologic/chemical stress agent called Lexiscan . This medicine will mimic walking on a treadmill by temporarily increasing your coronary blood flow.      PLEASE REPORT TO [facility] MEDICAL MALL ENTRANCE    THE VOLUNTEERS AT THE FIRST DESK WILL DIRECT YOU WHERE TO GO     *Please note: these test may take anywhere between 2-4 hours to complete       Date of Procedure:_____________________________________   Arrival Time for Procedure:______________________________    PLEASE NOTIFY THE OFFICE AT LEAST 24 HOURS IN ADVANCE IF YOU ARE UNABLE TO KEEP YOUR APPOINTMENT.  [phone]  PLEASE NOTIFY NUCLEAR MEDICINE AT [facility] AT LEAST 24 HOURS IN ADVANCE IF YOU ARE UNABLE TO KEEP YOUR APPOINTMENT. [phone]      How to prepare for your Myoview test:  1. Do not eat or drink after midnight  2. No caffeine for 24 hours prior to test  3. No smoking 24 hours prior to test.  4. Unless instructed otherwise, Take your medication with a small sips of water.    5.         Ladies, please do not wear dresses. Skirts or pants are appropriate. Please wear a short sleeve shirt.  6. No perfume, cologne or lotion.  7. Wear comfortable walking shoes. No heels!      Follow-Up: At [facility], you and your health needs are our priority.  As part of our continuing mission to provide you with exceptional heart care, we have created designated Provider Care Teams.  These Care Teams include your primary Cardiologist (physician) and Advanced Practice Providers (APPs -  Physician Assistants and Nurse Practitioners) who all work together to provide you with the  care you need, when you need it.  We recommend signing up for the patient portal called "MyChart".  Sign up information is provided on this After Visit Summary.  MyChart is used to connect with patients for Virtual Visits (Telemedicine).  Patients are able to view lab/test results, encounter notes, upcoming appointments, etc.  Non-urgent messages can be sent to your provider as well.   To learn more about what you can do with MyChart, go to [email or web address].    Your next appointment:   Follow up after testing   The format for your next  appointment:   In Person  Provider:   Debbe Odea, MD   Other Instructions

## 2020-10-15 NOTE — Progress Notes (Signed)
Cardiology Office Note:    Date:  10/15/2020   ID:  [name], DOB [birth year], MRN [id number]  PCP:  Alba Cory, MD   [facility]  Cardiologist:  Debbe Odea, MD  Advanced Practice Provider:  No care team member to display Electrophysiologist:  None       Referring MD: Alba Cory, MD   Chief Complaint  Patient presents with  . New Patient (Initial Visit)    Referred by PCP for Pure hypercholesterolemia, Chest tightness and SOB. Meds reviewed verbally with patient.     NYJAE Guzman is a 60 y.o. female who is being seen today for the evaluation of hyperlipidemia at the request of Alba Cory, MD.   History of Present Illness:    Candice Guzman is a 60 y.o. female with a hx of hypertension, hyperlipidemia, depression, rheumatoid arthritis who presents due to chest pain and hyperlipidemia.  Patient states having symptoms of chest tightness ongoing over the past 8 months or so.  Symptoms are located on the low side, rates as 3 out of 10 in severity.  She had one episode a month ago while at home which she rates as 10 out of 10 in severity.  Symptoms are associated with exertion.  Was recently told cholesterol levels were higher, her blood pressure is now well controlled at home.  Currently takes clonidine for both menopause and hypertension.  Systolic blood pressures run around 150s.  Past Medical History:  Diagnosis Date  . Depression   . Dyspnea   . Insomnia   . Lumbar herniated disc   . Menopause   . Rheumatoid arthritis (HCC)   . Shoulder pain   . Vitamin D deficiency     Past Surgical History:  Procedure Laterality Date  . BREAST BIOPSY Left 2010   neg  . CESAREAN SECTION    . FINGER ARTHROPLASTY Left 07/24/2017   Thumb  . GANGLION CYST EXCISION    . METACARPOPHALANGEAL JOINT ARTHROPLASTY Right 06/06/2016   Dr. Dayna Barker   . SHOULDER ARTHROSCOPY WITH ROTATOR CUFF REPAIR Right 02/24/2019   Procedure: RIGHT  SHOULDER ARTHROSCOPY WITH SUBACROMIAL DECOMPRESSION, DISTAL CLAVICLE EXCISION, LYSIS OF ADHESIONS,CAPSULOTOMY,MINI OPEN ROTATOR CUFF REPAIR;  Surgeon: Juanell Fairly, MD;  Location: [facility] ORS;  Service: Orthopedics;  Laterality: Right;    Current Medications: Current Meds  Medication Sig  . ALPRAZolam (XANAX) 0.5 MG tablet TAKE 1 TABLET(0.5 MG) BY MOUTH AT BEDTIME AS NEEDED  . atorvastatin (LIPITOR) 20 MG tablet Take 1 tablet (20 mg total) by mouth daily.  . cloNIDine (CATAPRES) 0.1 MG tablet TAKE 1 TABLET BY MOUTH  TWICE DAILY  . DULoxetine (CYMBALTA) 60 MG capsule Take 1 capsule (60 mg total) by mouth daily.  . eszopiclone (LUNESTA) 2 MG TABS tablet TAKE 1 TABLET BY MOUTH EVERY NIGHT AT BEDTIME. TAKE IMMEDIATELY BEFORE BEDTIME  . HUMIRA PEN 40 MG/0.8ML PNKT Inject 40 mg into the skin every 14 (fourteen) days.   . irbesartan (AVAPRO) 75 MG tablet Take 1 tablet (75 mg total) by mouth daily.  . predniSONE (STERAPRED UNI-PAK 21 TAB) 5 MG (21) TBPK tablet TAKE 6 TABLETS ON DAY 1 AS DIRECTED ON PACKAGE AND DECREASE BY 1 TAB EACH DAY FOR A TOTAL OF 6 DAYS  . tiZANidine (ZANAFLEX) 4 MG tablet Take 1 tablet (4 mg total) by mouth every 8 (eight) hours as needed for muscle spasms. Must last 30 days  . traMADol (ULTRAM) 50 MG tablet Take 1 tablet (50 mg total)  by mouth every 12 (twelve) hours as needed for severe pain. Must last 30 days     Allergies:   Codeine and Methotrexate   Social History   Socioeconomic History  . Marital status: Divorced    Spouse name: Not on file  . Number of children: 1  . Years of education: Not on file  . Highest education level: High school graduate  Occupational History  . Not on file  Tobacco Use  . Smoking status: Never Smoker  . Smokeless tobacco: Never Used  Vaping Use  . Vaping Use: Never used  Substance and Sexual Activity  . Alcohol use: No    Alcohol/week: 0.0 standard drinks  . Drug use: No  . Sexual activity: Yes    Partners: Male  Other  Topics Concern  . Not on file  Social History Narrative  . Not on file   Social Determinants of Health   Financial Resource Strain: Not on file  Food Insecurity: Not on file  Transportation Needs: Not on file  Physical Activity: Not on file  Stress: Not on file  Social Connections: Not on file     Family History: The patient's family history includes Cancer in her brother; Cancer (age of onset: 36) in her father; Emphysema in her mother.  ROS:   Please see the history of present illness.     All other systems reviewed and are negative.  EKGs/Labs/Other Studies Reviewed:    The following studies were reviewed today:   EKG:  EKG is  ordered today.  The ekg ordered today demonstrates sinus tachycardia  Recent Labs: 09/18/2020: ALT 10; BUN 12; Creatinine, Ser 0.89; Hemoglobin 13.4; Platelets 376; Potassium 4.3; Sodium 139; TSH 2.130  Recent Lipid Panel    Component Value Date/Time   CHOL 265 (H) 09/18/2020 1530   TRIG 162 (H) 09/18/2020 1530   HDL 57 09/18/2020 1530   CHOLHDL 4.6 (H) 09/18/2020 1530   LDLCALC 178 (H) 09/18/2020 1530     Risk Assessment/Calculations:      Physical Exam:    VS:  BP (!) 150/110 (BP Location: Left Arm, Patient Position: Sitting, Cuff Size: Normal)   Pulse (!) 110   Ht 5\' 4"  (1.626 m)   Wt 139 lb (63 kg)   LMP 12/18/2014   SpO2 96%   BMI 23.86 kg/m     Wt Readings from Last 3 Encounters:  10/15/20 139 lb (63 kg)  09/18/20 141 lb (64 kg)  05/21/20 146 lb 6.4 oz (66.4 kg)     GEN:  Well nourished, well developed in no acute distress HEENT: Normal NECK: No JVD; No carotid bruits LYMPHATICS: No lymphadenopathy CARDIAC: Tachycardic, regular, no murmurs, rubs, gallops RESPIRATORY:  Clear to auscultation without rales, wheezing or rhonchi  ABDOMEN: Soft, non-tender, non-distended MUSCULOSKELETAL:  No edema; No deformity  SKIN: Warm and dry NEUROLOGIC:  Alert and oriented x 3 PSYCHIATRIC:  Normal affect   ASSESSMENT:    1.  Precordial pain   2. Primary hypertension   3. Pure hypercholesterolemia    PLAN:    In order of problems listed above:  1. Chest pain, risk factors of hypertension, hyperlipidemia.  Get echo to evaluate systolic and diastolic function, obtain Lexiscan Myoview to evaluate presence of ischemia. 2. Hypertension, BP not controlled.  Start irbesartan 75 mg daily. 3. Hyperlipidemia, 10-year ASCVD risk 6.9%.  Start Lipitor 20 mg daily.  Follow-up after echo and Myoview.   Shared Decision Making/Informed Consent The risks [chest pain, shortness of  breath, cardiac arrhythmias, dizziness, blood pressure fluctuations, myocardial infarction, stroke/transient ischemic attack, nausea, vomiting, allergic reaction, radiation exposure, metallic taste sensation and life-threatening complications (estimated to be 1 in 10,000)], benefits (risk stratification, diagnosing coronary artery disease, treatment guidance) and alternatives of a nuclear stress test were discussed in detail with Ms. Studley and she agrees to proceed.   Medication Adjustments/Labs and Tests Ordered: Current medicines are reviewed at length with the patient today.  Concerns regarding medicines are outlined above.  Orders Placed This Encounter  Procedures  . NM Myocar Multi W/Spect W/Wall Motion / EF  . EKG 12-Lead  . ECHOCARDIOGRAM COMPLETE   Meds ordered this encounter  Medications  . irbesartan (AVAPRO) 75 MG tablet    Sig: Take 1 tablet (75 mg total) by mouth daily.    Dispense:  30 tablet    Refill:  5  . atorvastatin (LIPITOR) 20 MG tablet    Sig: Take 1 tablet (20 mg total) by mouth daily.    Dispense:  30 tablet    Refill:  5    Patient Instructions  Medication Instructions:   Your physician has recommended you make the following change in your medication:   1.  START Irbesartan (Avapro) 75 MG once daily.  2.  START Lipitor (Atorvastatin) 20 MG once daily.   *If you need a refill on your cardiac medications  before your next appointment, please call your pharmacy*   Lab Work: None ordered If you have labs (blood work) drawn today and your tests are completely normal, you will receive your results only by: Marland Kitchen MyChart Message (if you have MyChart) OR . A paper copy in the mail If you have any lab test that is abnormal or we need to change your treatment, we will call you to review the results.   Testing/Procedures:  1.  Your physician has requested that you have an echocardiogram. Echocardiography is a painless test that uses sound waves to create images of your heart. It provides your doctor with information about the size and shape of your heart and how well your heart's chambers and valves are working. This procedure takes approximately one hour. There are no restrictions for this procedure.  2.  [facility] MYOVIEW       Your caregiver has ordered a Stress Test with nuclear imaging. The purpose of this test is to evaluate the blood supply to your heart muscle. This procedure is referred to as a "Non-Invasive Stress Test." This is because other than having an IV started in your vein, nothing is inserted or "invades" your body. Cardiac stress tests are done to find areas of poor blood flow to the heart by determining the extent of coronary artery disease (CAD). Some patients exercise on a treadmill, which naturally increases the blood flow to your heart, while others who are  unable to walk on a treadmill due to physical limitations have a pharmacologic/chemical stress agent called Lexiscan . This medicine will mimic walking on a treadmill by temporarily increasing your coronary blood flow.      PLEASE REPORT TO [facility] MEDICAL MALL ENTRANCE   THE VOLUNTEERS AT THE FIRST DESK WILL DIRECT YOU WHERE TO GO     *Please note: these test may take anywhere between 2-4 hours to complete       Date of Procedure:_____________________________________   Arrival Time for Procedure:______________________________     PLEASE NOTIFY THE OFFICE AT LEAST 24 HOURS IN ADVANCE IF YOU ARE UNABLE TO KEEP YOUR APPOINTMENT.  [phone]  PLEASE NOTIFY NUCLEAR MEDICINE AT [facility] AT LEAST 24 HOURS IN ADVANCE IF YOU ARE UNABLE TO KEEP YOUR APPOINTMENT. [phone]      How to prepare for your Myoview test:  1. Do not eat or drink after midnight  2. No caffeine for 24 hours prior to test  3. No smoking 24 hours prior to test.  4. Unless instructed otherwise, Take your medication with a small sips of water.    5.         Ladies, please do not wear dresses. Skirts or pants are appropriate. Please wear a short sleeve shirt.  6. No perfume, cologne or lotion.  7. Wear comfortable walking shoes. No heels!      Follow-Up: At [facility], you and your health needs are our priority.  As part of our continuing mission to provide you with exceptional heart care, we have created designated Provider Care Teams.  These Care Teams include your primary Cardiologist (physician) and Advanced Practice Providers (APPs -  Physician Assistants and Nurse Practitioners) who all work together to provide you with the care you need, when you need it.  We recommend signing up for the patient portal called "MyChart".  Sign up information is provided on this After Visit Summary.  MyChart is used to connect with patients for Virtual Visits (Telemedicine).  Patients are able to view lab/test results, encounter notes, upcoming appointments, etc.  Non-urgent messages can be sent to your provider as well.   To learn more about what you can do with MyChart, go to [email or web address].    Your next appointment:   Follow up after testing   The format for your next appointment:   In Person  Provider:   Debbe Odea, MD   Other Instructions      Signed, Debbe Odea, MD  10/15/2020 5:03 PM    [facility]

## 2020-10-18 ENCOUNTER — Ambulatory Visit: Payer: Managed Care, Other (non HMO) | Admitting: Family Medicine

## 2020-10-18 ENCOUNTER — Other Ambulatory Visit: Payer: Self-pay | Admitting: Pain Medicine

## 2020-10-18 DIAGNOSIS — M7918 Myalgia, other site: Secondary | ICD-10-CM

## 2020-10-18 DIAGNOSIS — G8929 Other chronic pain: Secondary | ICD-10-CM

## 2020-10-22 ENCOUNTER — Ambulatory Visit: Payer: Managed Care, Other (non HMO)

## 2020-10-31 ENCOUNTER — Ambulatory Visit
Admission: RE | Admit: 2020-10-31 | Discharge: 2020-10-31 | Disposition: A | Discharge status: Home / Self Care | Payer: Managed Care, Other (non HMO) | Source: Ambulatory Visit | Attending: Cardiology | Admitting: Cardiology

## 2020-10-31 ENCOUNTER — Other Ambulatory Visit: Payer: Self-pay

## 2020-10-31 DIAGNOSIS — R072 Precordial pain: Secondary | ICD-10-CM

## 2020-10-31 LAB — NM MYOCAR MULTI W/SPECT W/WALL MOTION / EF
Estimated workload: 1 METS
Exercise duration (min): 0 min
Exercise duration (sec): 0 s
LV dias vol: 45 mL (ref 46–106)
LV sys vol: 13 mL
MPHR: 161 {beats}/min
Peak HR: 111 {beats}/min
Percent HR: 68 %
Rest HR: 78 {beats}/min
SDS: 0
SRS: 1
SSS: 0
TID: 0.76

## 2020-10-31 MED ORDER — TECHNETIUM TC 99M TETROFOSMIN IV KIT
30.0000 | PACK | Freq: Once | INTRAVENOUS | Status: AC | PRN
  Administered 2020-10-31: 30.6 via INTRAVENOUS

## 2020-10-31 MED ORDER — REGADENOSON 0.4 MG/5ML IV SOLN
0.4000 mg | Freq: Once | INTRAVENOUS | Status: AC
  Administered 2020-10-31: 0.4 mg via INTRAVENOUS

## 2020-10-31 MED ORDER — TECHNETIUM TC 99M TETROFOSMIN IV KIT
10.0000 | PACK | Freq: Once | INTRAVENOUS | Status: AC | PRN
  Administered 2020-10-31: 10.43 via INTRAVENOUS

## 2020-11-07 ENCOUNTER — Other Ambulatory Visit: Payer: Self-pay | Admitting: Family Medicine

## 2020-11-07 DIAGNOSIS — N951 Menopausal and female climacteric states: Secondary | ICD-10-CM

## 2020-11-07 NOTE — Telephone Encounter (Signed)
Requested Prescriptions  Pending Prescriptions Disp Refills  . cloNIDine (CATAPRES) 0.1 MG tablet [Pharmacy Med Name: cloNIDine HCl 0.1 MG Oral Tablet] 180 tablet 0    Sig: TAKE 1 TABLET BY MOUTH  TWICE DAILY     Cardiovascular:  Alpha-2 Agonists Failed - 11/07/2020  9:29 PM      Failed - Last BP in normal range    BP Readings from Last 1 Encounters:  10/15/20 (!) 150/110         Passed - Last Heart Rate in normal range    Pulse Readings from Last 1 Encounters:  10/15/20 (!) 110         Passed - Valid encounter within last 6 months    Recent Outpatient Visits          1 month ago Fatigue, unspecified type   [facility], Danna Hefty, MD   5 months ago Depression, major, recurrent, moderate [facility])   [facility] Alba Cory, MD   8 months ago Anxiety   [facility] Danelle Berry, PA-C   1 year ago Seronegative rheumatoid arthritis [facility])   [facility] Alba Cory, MD   1 year ago Seronegative rheumatoid arthritis [facility])   [facility] Alba Cory, MD      Future Appointments            In 2 weeks Agbor-Etang, Arlys John, MD [facility]   In 2 months Alba Cory, MD [facility], [facility]

## 2020-11-13 ENCOUNTER — Other Ambulatory Visit: Payer: Self-pay

## 2020-11-13 ENCOUNTER — Ambulatory Visit (INDEPENDENT_AMBULATORY_CARE_PROVIDER_SITE_OTHER): Payer: Managed Care, Other (non HMO)

## 2020-11-13 DIAGNOSIS — R072 Precordial pain: Secondary | ICD-10-CM | POA: Diagnosis not present

## 2020-11-13 LAB — ECHOCARDIOGRAM COMPLETE
AR max vel: 2.65 cm2
AV Area VTI: 2.65 cm2
AV Area mean vel: 2.8 cm2
AV Mean grad: 2 mmHg
AV Peak grad: 5 mmHg
Ao pk vel: 1.12 m/s
Area-P 1/2: 3.95 cm2
Calc EF: 71.3 %
S' Lateral: 2.3 cm
Single Plane A2C EF: 73 %
Single Plane A4C EF: 70.4 %

## 2020-11-15 ENCOUNTER — Telehealth: Payer: Self-pay | Admitting: *Deleted

## 2020-11-15 NOTE — Telephone Encounter (Signed)
Patient returning call for results 

## 2020-11-15 NOTE — Telephone Encounter (Signed)
-----   Message from Debbe Odea, MD sent at 11/14/2020  5:47 PM EDT ----- Normal systolic and diastolic function, normal echocardiogram, no findings to suggest etiology of chest pain.

## 2020-11-15 NOTE — Telephone Encounter (Signed)
Called and left a message for patient to call back .

## 2020-11-15 NOTE — Telephone Encounter (Signed)
Left voicemail message on both home and mobile numbers to call back for review of results.

## 2020-11-20 NOTE — Telephone Encounter (Signed)
Sent normal result notes to patient through MyChart.

## 2020-11-23 ENCOUNTER — Ambulatory Visit: Payer: Managed Care, Other (non HMO) | Admitting: Cardiology

## 2020-12-12 ENCOUNTER — Other Ambulatory Visit: Payer: Self-pay | Admitting: Family Medicine

## 2020-12-12 DIAGNOSIS — F331 Major depressive disorder, recurrent, moderate: Secondary | ICD-10-CM

## 2020-12-12 NOTE — Telephone Encounter (Signed)
Requested Prescriptions  Pending Prescriptions Disp Refills  . DULoxetine (CYMBALTA) 60 MG capsule [Pharmacy Med Name: DULOXETINE DR 60MG  CAPSULES] 90 capsule 0    Sig: TAKE 1 CAPSULE(60 MG) BY MOUTH DAILY     Psychiatry: Antidepressants - SNRI Failed - 12/12/2020  3:36 AM      Failed - Last BP in normal range    BP Readings from Last 1 Encounters:  10/15/20 (!) 150/110         Passed - Completed PHQ-2 or PHQ-9 in the last 360 days      Passed - Valid encounter within last 6 months    Recent Outpatient Visits          2 months ago Fatigue, unspecified type   [facility], MD   6 months ago Depression, major, recurrent, moderate [facility])   [facility], MD   10 months ago Anxiety   [facility], PA-C   1 year ago Seronegative rheumatoid arthritis [facility])   [facility], MD   1 year ago Seronegative rheumatoid arthritis [facility])   [facility], MD      Future Appointments            In 2 weeks Agbor-Etang, Alba Cory, MD [facility], [facility]   In 1 month [facility], MD [facility], [facility]

## 2020-12-31 ENCOUNTER — Telehealth: Payer: Self-pay | Admitting: Family Medicine

## 2020-12-31 ENCOUNTER — Ambulatory Visit: Payer: Managed Care, Other (non HMO) | Admitting: Cardiology

## 2020-12-31 DIAGNOSIS — G4700 Insomnia, unspecified: Secondary | ICD-10-CM

## 2020-12-31 NOTE — Telephone Encounter (Signed)
Requested medication (s) are due for refill today - yes  Requested medication (s) are on the active medication list -yes  Future visit scheduled -yes  Last refill: 09/25/20  Notes to clinic: Request non delegated Rx  Requested Prescriptions  Pending Prescriptions Disp Refills   eszopiclone (LUNESTA) 2 MG TABS tablet [Pharmacy Med Name: ESZOPICLONE 2MG  TABLETS] 90 tablet     Sig: TAKE 1 TABLET BY MOUTH EVERY NIGHT AT BEDTIME      Not Delegated - Psychiatry:  Anxiolytics/Hypnotics Failed - 12/31/2020  2:02 PM      Failed - This refill cannot be delegated      Passed - Urine Drug Screen completed in last 360 days      Passed - Valid encounter within last 6 months    Recent Outpatient Visits           3 months ago Fatigue, unspecified type   [facility], MD   7 months ago Depression, major, recurrent, moderate [facility])   [facility], MD   10 months ago Anxiety   [facility], PA-C   1 year ago Seronegative rheumatoid arthritis [facility])   [facility], MD   1 year ago Seronegative rheumatoid arthritis [facility])   [facility], MD       Future Appointments             In 2 weeks Alba Cory, MD [facility], [facility]                 Requested Prescriptions  Pending Prescriptions Disp Refills   eszopiclone (LUNESTA) 2 MG TABS tablet [Pharmacy Med Name: ESZOPICLONE 2MG  TABLETS] 90 tablet     Sig: TAKE 1 TABLET BY MOUTH EVERY NIGHT AT BEDTIME      Not Delegated - Psychiatry:  Anxiolytics/Hypnotics Failed - 12/31/2020  2:02 PM      Failed - This refill cannot be delegated      Passed - Urine Drug Screen completed in last 360 days      Passed - Valid encounter within last 6 months    Recent Outpatient Visits           3 months ago Fatigue, unspecified type   [facility], MD   7 months ago Depression, major, recurrent, moderate [facility])   [facility], MD   10 months ago Anxiety   [facility], PA-C   1 year ago Seronegative rheumatoid arthritis [facility])   [facility], MD   1 year ago Seronegative rheumatoid arthritis [facility])   [facility], MD       Future Appointments             In 2 weeks [facility], MD [facility], [facility]

## 2020-12-31 NOTE — Telephone Encounter (Signed)
Last seen 3.15.2022 next appt 7.15.2022

## 2021-01-01 NOTE — Telephone Encounter (Signed)
Pt verbally informed prescription has been sent to pharmacy 

## 2021-01-17 NOTE — Progress Notes (Signed)
Name: Candice Guzman   MRN: [id number]    DOB: Mar 27, 1961   Date:01/18/2021       Progress Note  Subjective  Chief Complaint  Follow Up  HPI  Seronegative Rheumatoid Arthritis: She has been off Simponi for years  because insurance stopped paying for it. She is back on Humira and since May 2018 off methotrexate because of hair loss. She is still under the care of Dr. Gavin Potters. Worse symptoms are usually on her elbows and feet  History of kidney stone: she went to urgent care a couple of times and treated for UTI but once she passed a small stone at home symptoms resolved, she will bring her stone for analysis.    Insomnia: she has been on Lunesta for a while now since Ambien no longer on formulary, she states she has been sleeping okay with Alfonso Patten, gets about 6 hours of sleep on medication but interrupted sleep because of body aches, also states difficulty falling back asleep at times due to anxiety .    Major Depression:  She states mood is affected by constant pain, phq 9 is stable but she states having a good day today. Hanging with a friend and not in pain. . She denies suicidal thoughts or ideation. She is currently on Duloxetine, we tried Seroquel at night but caused her to not rest and had weird dreams.  Phq 9 today is normal, doing well and compliant with Duloxetine    Menopause: she is now on Clonidine BID   she was using topical drysol on axilla but ran out - prescribed by Dermatologist.  Doing well now, hot flashes has resolved, she occasionally has night sweats     Back pain: she states she has a constant lower back tightness ,she saw Dr. Joanna Puff for back injections for DDD but insurance denied any more coverage. Still takes Tramadol prn, off gabapentin and lyrica because of side effects . Pain has been stable around 1/10    Raynaud's: she states much worse during colder months, she states also doing better   SOB: she states symptoms have improved, she was seen by cardiologist  and normal myoview stress test on 0427/2022 , advised her to consider seeing pulmonologist and to discuss SOB with Dr. Gavin Potters - RA  Dyslipidemia: her LDL was 178 March 2022, sent her rx for Atorvastatin but she never started. She was worried about side effects. She states she does not want to try any other medication for cholesterol, she wants to try a healthier diet. She has a significant family history of heart disease. Discussed possible familial hyperlipidemia. She will have labs done at work soon and will send me results.   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   Pain in joint of right shoulder 01/18/2019   Stiffness of right shoulder joint 01/18/2019   Muscle weakness 01/18/2019   Osteoarthritis involving multiple joints 12/07/2018   Moderate episode of recurrent major depressive disorder (HCC) 08/27/2018   Foot drop (Right) 08/04/2018   Abnormal MRI, lumbar spine (06/21/2018) 08/04/2018   Chronic lumbar radicular pain (L5 dermatome) (Bilateral) (R>L) 05/26/2018   Long term prescription benzodiazepine use 09/21/2017   Chronic lower extremity pain (1ry area of Pain) (Bilateral) (R>L) 09/21/2017   Chronic low back pain (2ry area of Pain) (Bilateral) (R>L) 09/21/2017   Pharmacologic therapy 09/21/2017   Problems influencing health status 09/21/2017   Spondylosis without myelopathy or radiculopathy, lumbosacral region 09/21/2017   Chronic  hip pain (3ry area of Pain) (Bilateral) (R>L) 09/21/2017   Chronic shoulder pain (Bilateral) (L>R) 09/21/2017   Grade 1 Anterolisthesis of L5 over S1 & L3 over L4 09/21/2017   Grade 1 Retrolisthesis L1 over L2 & L2 over L3 09/21/2017   DDD (degenerative disc disease), lumbar 09/21/2017   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) 09/21/2017   Lumbar facet joint syndrome (Bilateral) (R>L) 09/21/2017   Chronic musculoskeletal pain 09/21/2017   Neurogenic pain 09/21/2017   Lumbar  foraminal stenosis (L5-S1) (Bilateral) 09/21/2017   Lumbar lateral recess stenosis (Bilateral) (B: L4-5) (L>R: L3-4) 09/21/2017   Lumbar facet hypertrophy (Bilateral: L3-4, L4-5, and L5-S1) 09/21/2017   Chronic pain syndrome 07/08/2017   Chronic knee pain (4th area of Pain) (Bilateral) 07/08/2017   Chronic wrist pain (Fifth Area of Pain) (Bilateral) 07/08/2017   Disorder of skeletal system 07/08/2017   Other long term (current) drug therapy 07/08/2017   Long term current use of opiate analgesic 07/08/2017   Other specified health status 07/08/2017   Chronic pain of multiple joints 05/20/2017   Insomnia, persistent 12/18/2014   Depression, major, single episode, moderate (HCC) 12/18/2014   Menopausal symptom 12/18/2014   Vitamin D insufficiency 12/18/2014   Inflammatory arthritis (HCC) 02/23/2014   Lumbosacral radiculopathy (L5) (Right) 12/12/2013   Bulge of lumbar disc without myelopathy 06/18/2011    Past Surgical History:  Procedure Laterality Date   BREAST BIOPSY Left 2010   neg   CESAREAN SECTION     FINGER ARTHROPLASTY Left 07/24/2017   Thumb   GANGLION CYST EXCISION     METACARPOPHALANGEAL JOINT ARTHROPLASTY Right 06/06/2016   Dr. Dayna Barker    SHOULDER ARTHROSCOPY WITH ROTATOR CUFF REPAIR Right 02/24/2019   Procedure: RIGHT SHOULDER ARTHROSCOPY WITH SUBACROMIAL DECOMPRESSION, DISTAL CLAVICLE EXCISION, LYSIS OF ADHESIONS,CAPSULOTOMY,MINI OPEN ROTATOR CUFF REPAIR;  Surgeon: Juanell Fairly, MD;  Location: [facility];  Service: Orthopedics;  Laterality: Right;    Family History  Problem Relation Age of Onset   Emphysema Mother    Cancer Father 55       lung   Cancer Brother        tongue    Social History   Tobacco Use   Smoking status: Never   Smokeless tobacco: Never  Substance Use Topics   Alcohol use: No    Alcohol/week: 0.0 standard drinks     Current Outpatient Medications:    ALPRAZolam (XANAX) 0.5 MG tablet, TAKE 1 TABLET(0.5 MG) BY MOUTH AT BEDTIME AS  NEEDED, Disp: 30 tablet, Rfl: 0   cloNIDine (CATAPRES) 0.1 MG tablet, TAKE 1 TABLET BY MOUTH  TWICE DAILY, Disp: 180 tablet, Rfl: 0   DULoxetine (CYMBALTA) 60 MG capsule, TAKE 1 CAPSULE(60 MG) BY MOUTH DAILY, Disp: 90 capsule, Rfl: 0   eszopiclone (LUNESTA) 2 MG TABS tablet, TAKE 1 TABLET BY MOUTH EVERY NIGHT AT BEDTIME, Disp: 30 tablet, Rfl: 0   HUMIRA PEN 40 MG/0.8ML PNKT, Inject 40 mg into the skin every 14 (fourteen) days. , Disp: , Rfl: 3   atorvastatin (LIPITOR) 20 MG tablet, Take 1 tablet (20 mg total) by mouth daily. (Patient not taking: Reported on 01/18/2021), Disp: 30 tablet, Rfl: 5   irbesartan (AVAPRO) 75 MG tablet, Take 1 tablet (75 mg total) by mouth daily. (Patient not taking: Reported on 01/18/2021), Disp: 30 tablet, Rfl: 5   predniSONE (STERAPRED UNI-PAK 21 TAB) 5 MG (21) TBPK tablet, TAKE 6 TABLETS ON DAY 1 AS DIRECTED ON PACKAGE AND DECREASE BY 1 TAB EACH DAY  FOR A TOTAL OF 6 DAYS (Patient not taking: Reported on 01/18/2021), Disp: , Rfl:    tiZANidine (ZANAFLEX) 4 MG tablet, Take 1 tablet (4 mg total) by mouth every 8 (eight) hours as needed for muscle spasms. Must last 30 days, Disp: 90 tablet, Rfl: 2   traMADol (ULTRAM) 50 MG tablet, Take 1 tablet (50 mg total) by mouth every 12 (twelve) hours as needed for severe pain. Must last 30 days, Disp: 60 tablet, Rfl: 2  Allergies  Allergen Reactions   Codeine Itching   Methotrexate Nausea Only    I personally reviewed active problem list, medication list, allergies, family history, social history, health maintenance with the patient/caregiver today.   ROS  Constitutional: Negative for fever or weight change.  Respiratory: Negative for cough and shortness of breath.   Cardiovascular: Negative for chest pain or palpitations.  Gastrointestinal: Negative for abdominal pain, no bowel changes.  Musculoskeletal: positive for gait problem or joint swelling.  Skin: Negative for rash.  Neurological: Negative for dizziness or headache.   No other specific complaints in a complete review of systems (except as listed in HPI above).   Objective  Vitals:   01/18/21 1543  BP: 132/80  Pulse: 96  Resp: 16  Temp: 99.6 F (37.6 C)  TempSrc: Oral  SpO2: 97%  Weight: 136 lb (61.7 kg)  Height: 5\' 4"  (1.626 m)    Body mass index is 23.34 kg/m.  Physical Exam  Constitutional: Patient appears well-developed and well-nourished. Overweight.  No distress.  HEENT: head atraumatic, normocephalic, pupils equal and reactive to light, neck supple Cardiovascular: Normal rate, regular rhythm and normal heart sounds.  No murmur heard. No BLE edema. Pulmonary/Chest: Effort normal and breath sounds normal. No respiratory distress. Abdominal: Soft.  There is no tenderness. Psychiatric: Patient has a normal mood and affect. behavior is normal. Judgment and thought content normal.  Muscular skeletal: no synovitis   Recent Results (from the past 2160 hour(s))  NM Myocar Multi W/Spect W/Wall Motion / EF     Status: None   Collection Time: 10/31/20 11:02 AM  Result Value Ref Range   Rest HR 78 bpm   Rest BP 149/90 mmHg   Exercise duration (sec) 0 sec   Percent HR 68 %   Exercise duration (min) 0 min   Estimated workload 1.0 METS   Peak HR 111 bpm   Peak BP 156/71 mmHg   MPHR 161 bpm   SSS 0    SRS 1    SDS 0    TID 0.76    LV sys vol 13 mL   LV dias vol 45 46 - 106 mL  ECHOCARDIOGRAM COMPLETE     Status: None   Collection Time: 11/13/20  2:26 PM  Result Value Ref Range   AR max vel 2.65 cm2   AV Peak grad 5.0 mmHg   Ao pk vel 1.12 m/s   S' Lateral 2.30 cm   Area-P 1/2 3.95 cm2   AV Area VTI 2.65 cm2   AV Mean grad 2.0 mmHg   Single Plane A4C EF 70.4 %   Single Plane A2C EF 73.0 %   Calc EF 71.3 %   AV Area mean vel 2.80 cm2    PHQ2/9: Depression screen [facility] 2/9 01/18/2021 09/18/2020 05/21/2020 02/15/2020 11/03/2019  Decreased Interest 0 3 1 3 1   Down, Depressed, Hopeless 0 3 2 3 1   PHQ - 2 Score 0 6 3 6 2   Altered  sleeping 0 0  3 3 3   Tired, decreased energy 3 3 3 3 1   Change in appetite 0 3 0 0 1  Feeling bad or failure about yourself  0 0 0 0 0  Trouble concentrating 0 0 1 0 1  Moving slowly or fidgety/restless 0 0 0 0 0  Suicidal thoughts 0 0 0 0 0  PHQ-9 Score 3 12 10 12 8   Difficult doing work/chores - - Not difficult at all Somewhat difficult Somewhat difficult  Some recent data might be hidden    phq 9 is negative   Fall Risk: Fall Risk  01/18/2021 09/18/2020 05/21/2020 04/18/2020 02/15/2020  Falls in the past year? 0 0 0 0 0  Comment - - - - -  Number falls in past yr: 0 0 0 - 0  Injury with Fall? 0 0 0 - 0  Comment - - - - -  Risk for fall due to : - - - No Fall Risks -  Risk for fall due to: Comment - - - - -  Follow up - - - Falls evaluation completed Falls evaluation completed     Functional Status Survey: Is the patient deaf or have difficulty hearing?: No Does the patient have difficulty seeing, even when wearing glasses/contacts?: No Does the patient have difficulty concentrating, remembering, or making decisions?: Yes Does the patient have difficulty walking or climbing stairs?: Yes Does the patient have difficulty dressing or bathing?: Yes Does the patient have difficulty doing errands alone such as visiting a doctor's office or shopping?: Yes    Assessment & Plan  1. Seronegative rheumatoid arthritis (HCC)  Needs to follow up with Dr. Gavin Potters   2. Raynaud's disease without gangrene  Doing well since warm weather   3. Chronic pain syndrome   4. Vitamin B12 deficiency   5. Vitamin D deficiency  Resume supplementation   6. Pure hypercholesterolemia  Discussed statin therapy   7. Chronic low back pain without sciatica, unspecified back pain laterality   8. Depression, major, in remission (HCC)  Continue duloxetine   9. Insomnia, persistent  - eszopiclone (LUNESTA) 2 MG TABS tablet; Take 1 tablet (2 mg total) by mouth at bedtime. Take immediately  before bedtime  Dispense: 90 tablet; Refill: 0 - DULoxetine (CYMBALTA) 60 MG capsule; Take 1 capsule (60 mg total) by mouth daily.  Dispense: 90 capsule; Refill: 1  10. SOB (shortness of breath)  Improving, she does not want to see Pulmonologist at this time  11. History of kidney stones  - Calculi, with Photograph (to Clinical Lab)  12. Insomnia, persistent  - eszopiclone (LUNESTA) 2 MG TABS tablet; Take 1 tablet (2 mg total) by mouth at bedtime. Take immediately before bedtime  Dispense: 90 tablet; Refill: 0 - DULoxetine (CYMBALTA) 60 MG capsule; Take 1 capsule (60 mg total) by mouth daily.  Dispense: 90 capsule; Refill: 1  13. Anxiety  - ALPRAZolam (XANAX) 0.5 MG tablet; TAKE 1 TABLET(0.5 MG) BY MOUTH AT BEDTIME AS NEEDED  Dispense: 30 tablet; Refill: 0  14. Menopausal symptom

## 2021-01-18 ENCOUNTER — Other Ambulatory Visit: Payer: Self-pay

## 2021-01-18 ENCOUNTER — Ambulatory Visit: Payer: Managed Care, Other (non HMO) | Admitting: Family Medicine

## 2021-01-18 ENCOUNTER — Encounter: Payer: Self-pay | Admitting: Family Medicine

## 2021-01-18 VITALS — BP 132/80 | HR 96 | Temp 99.6°F | Resp 16 | Ht 64.0 in | Wt 136.0 lb

## 2021-01-18 DIAGNOSIS — F325 Major depressive disorder, single episode, in full remission: Secondary | ICD-10-CM

## 2021-01-18 DIAGNOSIS — M545 Low back pain, unspecified: Secondary | ICD-10-CM

## 2021-01-18 DIAGNOSIS — M0600 Rheumatoid arthritis without rheumatoid factor, unspecified site: Secondary | ICD-10-CM

## 2021-01-18 DIAGNOSIS — E7800 Pure hypercholesterolemia, unspecified: Secondary | ICD-10-CM

## 2021-01-18 DIAGNOSIS — F419 Anxiety disorder, unspecified: Secondary | ICD-10-CM

## 2021-01-18 DIAGNOSIS — G8929 Other chronic pain: Secondary | ICD-10-CM

## 2021-01-18 DIAGNOSIS — E538 Deficiency of other specified B group vitamins: Secondary | ICD-10-CM

## 2021-01-18 DIAGNOSIS — E559 Vitamin D deficiency, unspecified: Secondary | ICD-10-CM

## 2021-01-18 DIAGNOSIS — I7300 Raynaud's syndrome without gangrene: Secondary | ICD-10-CM

## 2021-01-18 DIAGNOSIS — R0602 Shortness of breath: Secondary | ICD-10-CM

## 2021-01-18 DIAGNOSIS — Z87442 Personal history of urinary calculi: Secondary | ICD-10-CM

## 2021-01-18 DIAGNOSIS — G894 Chronic pain syndrome: Secondary | ICD-10-CM

## 2021-01-18 DIAGNOSIS — Z23 Encounter for immunization: Secondary | ICD-10-CM | POA: Diagnosis not present

## 2021-01-18 DIAGNOSIS — G4700 Insomnia, unspecified: Secondary | ICD-10-CM

## 2021-01-18 DIAGNOSIS — N951 Menopausal and female climacteric states: Secondary | ICD-10-CM

## 2021-01-18 MED ORDER — ESZOPICLONE 2 MG PO TABS: 2.0000 mg | ORAL_TABLET | Freq: Every day | ORAL | 0 refills | Status: DC

## 2021-01-18 MED ORDER — ALPRAZOLAM 0.5 MG PO TABS
ORAL_TABLET | ORAL | 0 refills | Status: DC
Start: 2021-01-18 — End: 2022-02-17

## 2021-01-18 MED ORDER — DULOXETINE HCL 60 MG PO CPEP: 60.0000 mg | ORAL_CAPSULE | Freq: Every day | ORAL | 1 refills | Status: DC

## 2021-05-07 ENCOUNTER — Other Ambulatory Visit: Payer: Self-pay | Admitting: Family Medicine

## 2021-05-07 DIAGNOSIS — G4700 Insomnia, unspecified: Secondary | ICD-10-CM

## 2021-05-07 NOTE — Telephone Encounter (Signed)
Pt has appt 11/15

## 2021-05-20 NOTE — Progress Notes (Signed)
Name: Candice Guzman   MRN: [id number]    DOB: Jan 02, 1961   Date:05/21/2021       Progress Note  Subjective  Chief Complaint  Follow Up  HPI  Seronegative Rheumatoid Arthritis: She has been off Simponi for years  because insurance stopped paying for it. She is back on Humira and since May 2018 off methotrexate because of hair loss. She is still under the care of Dr. Gavin Potters. She is stable at this time.   History of kidney stone: she went to urgent care a couple of times and treated for UTI but once she passed a small stone at home symptoms resolved, she brought the stone for analysis  today    Insomnia: she has been on Lunesta for a while now since Ambien no longer on formulary, she states she has been sleeping okay with Alfonso Patten, gets about 6 hours of sleep on medication but interrupted sleep because of body aches, also states difficulty falling back asleep at times due to anxiety . Stable on medication    Major Depression:  She states mood is affected by constant pain, phq 9 is stable but she states having a good day today. Hanging with a friend and not in pain. . She denies suicidal thoughts or ideation. She is currently on Duloxetine, we tried Seroquel at night but caused her to not rest and had weird dreams.  Phq 9 today is normal, doing well and compliant with Duloxetine    Menopause: she is now on Clonidine BID   she was using topical drysol on axilla but ran out - prescribed by Dermatologist. She states doing better, sweating is not as severe now      Back pain: she states she has a constant lower back tightness ,she saw Dr. Joanna Puff for back injections for DDD but insurance denied any more coverage. Still takes Tramadol prn, off gabapentin and lyrica because of side effects .She has a different job within the same department at [employer] but can switch position more, able to stan and sit down and pain has improved  Raynaud's: she states much worse during colder months, having a flare  on left 5th   Left foot pain: she rolled over her left foot 3 days ago , she step on a pile of leaves and there was a log underneath , she rolled and fell on her left leg. She developed and swelling and bruising, able to walk on it without much pain, swelling is down and is improving. Explained likely a sprain  SOB: she states symptoms have improved, she was seen by cardiologist and normal myoview stress test on 0427/2022 , she decided not to see Pulmonologist   Dyslipidemia: her LDL was 178 March 2022, sent her rx for Atorvastatin but she never started, afraid of side effects. .She has not really changed her diet but decreasing portion size.  She has a significant family history of heart disease. Discussed possible familial hyperlipidemia.  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   Pain in joint of right shoulder 01/18/2019   Stiffness of right shoulder joint 01/18/2019   Muscle weakness 01/18/2019   Osteoarthritis involving multiple joints 12/07/2018   Moderate episode of recurrent major depressive disorder (HCC) 08/27/2018   Foot drop (Right) 08/04/2018   Abnormal MRI, lumbar spine (06/21/2018) 08/04/2018   Chronic lumbar radicular pain (L5 dermatome) (Bilateral) (  R>L) 05/26/2018   Long term prescription benzodiazepine use 09/21/2017   Chronic lower extremity pain (1ry area of Pain) (Bilateral) (R>L) 09/21/2017   Chronic low back pain (2ry area of Pain) (Bilateral) (R>L) 09/21/2017   Pharmacologic therapy 09/21/2017   Problems influencing health status 09/21/2017   Spondylosis without myelopathy or radiculopathy, lumbosacral region 09/21/2017   Chronic hip pain (3ry area of Pain) (Bilateral) (R>L) 09/21/2017   Chronic shoulder pain (Bilateral) (L>R) 09/21/2017   Grade 1 Anterolisthesis of L5 over S1 & L3 over L4 09/21/2017   Grade 1 Retrolisthesis L1 over L2 & L2 over L3  09/21/2017   DDD (degenerative disc disease), lumbar 09/21/2017   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) 09/21/2017   Lumbar facet joint syndrome (Bilateral) (R>L) 09/21/2017   Chronic musculoskeletal pain 09/21/2017   Neurogenic pain 09/21/2017   Lumbar foraminal stenosis (L5-S1) (Bilateral) 09/21/2017   Lumbar lateral recess stenosis (Bilateral) (B: L4-5) (L>R: L3-4) 09/21/2017   Lumbar facet hypertrophy (Bilateral: L3-4, L4-5, and L5-S1) 09/21/2017   Chronic pain syndrome 07/08/2017   Chronic knee pain (4th area of Pain) (Bilateral) 07/08/2017   Chronic wrist pain (Fifth Area of Pain) (Bilateral) 07/08/2017   Disorder of skeletal system 07/08/2017   Other long term (current) drug therapy 07/08/2017   Long term current use of opiate analgesic 07/08/2017   Other specified health status 07/08/2017   Chronic pain of multiple joints 05/20/2017   Insomnia, persistent 12/18/2014   Depression, major, single episode, moderate (HCC) 12/18/2014   Menopausal symptom 12/18/2014   Vitamin D insufficiency 12/18/2014   Inflammatory arthritis (HCC) 02/23/2014   Lumbosacral radiculopathy (L5) (Right) 12/12/2013   Bulge of lumbar disc without myelopathy 06/18/2011    Past Surgical History:  Procedure Laterality Date   BREAST BIOPSY Left 2010   neg   CESAREAN SECTION     FINGER ARTHROPLASTY Left 07/24/2017   Thumb   GANGLION CYST EXCISION     METACARPOPHALANGEAL JOINT ARTHROPLASTY Right 06/06/2016   Dr. Dayna Barker    SHOULDER ARTHROSCOPY WITH ROTATOR CUFF REPAIR Right 02/24/2019   Procedure: RIGHT SHOULDER ARTHROSCOPY WITH SUBACROMIAL DECOMPRESSION, DISTAL CLAVICLE EXCISION, LYSIS OF ADHESIONS,CAPSULOTOMY,MINI OPEN ROTATOR CUFF REPAIR;  Surgeon: Juanell Fairly, MD;  Location: [facility];  Service: Orthopedics;  Laterality: Right;    Family History  Problem Relation Age of Onset   Emphysema Mother    Cancer Father 2       lung   Cancer Brother        tongue    Social  History   Tobacco Use   Smoking status: Never   Smokeless tobacco: Never  Substance Use Topics   Alcohol use: No    Alcohol/week: 0.0 standard drinks     Current Outpatient Medications:    ALPRAZolam (XANAX) 0.5 MG tablet, TAKE 1 TABLET(0.5 MG) BY MOUTH AT BEDTIME AS NEEDED, Disp: 30 tablet, Rfl: 0   cloNIDine (CATAPRES) 0.1 MG tablet, TAKE 1 TABLET BY MOUTH  TWICE DAILY, Disp: 180 tablet, Rfl: 0   HUMIRA PEN 40 MG/0.8ML PNKT, Inject 40 mg into the skin every 14 (fourteen) days. , Disp: , Rfl: 3   DULoxetine (CYMBALTA) 60 MG capsule, Take 1 capsule (60 mg total) by mouth daily., Disp: 90 capsule, Rfl: 1   eszopiclone (LUNESTA) 2 MG TABS tablet, Take 1 tablet (2 mg total) by mouth at bedtime. Take immediately before bedtime, Disp: 90 tablet, Rfl: 1   tiZANidine (ZANAFLEX) 4 MG tablet, Take 1 tablet (4 mg total) by mouth  every 8 (eight) hours as needed for muscle spasms. Must last 30 days, Disp: 90 tablet, Rfl: 2   traMADol (ULTRAM) 50 MG tablet, Take 1 tablet (50 mg total) by mouth every 12 (twelve) hours as needed for severe pain. Must last 30 days, Disp: 60 tablet, Rfl: 2  Allergies  Allergen Reactions   Codeine Itching   Methotrexate Nausea Only    I personally reviewed active problem list, medication list, allergies, family history, social history, health maintenance with the patient/caregiver today.   ROS  Ten systems reviewed and is negative except as mentioned in HPI   Objective  Vitals:   05/21/21 1419  BP: 128/82  Pulse: 79  Resp: 16  Temp: 97.9 F (36.6 C)  SpO2: 98%  Weight: 135 lb (61.2 kg)  Height: 5\' 4"  (1.626 m)    Body mass index is 23.17 kg/m.  Physical Exam  Constitutional: Patient appears well-developed and well-nourished.  No distress.  HEENT: head atraumatic, normocephalic, pupils equal and reactive to light,  neck supple Cardiovascular: Normal rate, regular rhythm and normal heart sounds.  No murmur heard. Left 5th finger swollen and white in  the middle part  Pulmonary/Chest: Effort normal and breath sounds normal. No respiratory distress. Abdominal: Soft.  There is no tenderness. Psychiatric: Patient has a normal mood and affect. behavior is normal. Judgment and thought content normal.  Muscular skeletal: no synovitis on hands, bruise on dorsal foot, very mild swelling, normal rom or able to bear weight, no pain during palpation of malleolus     PHQ2/9: Depression screen [facility] 2/9 05/21/2021 01/18/2021 09/18/2020 05/21/2020 02/15/2020  Decreased Interest 0 0 3 1 3   Down, Depressed, Hopeless 0 0 3 2 3   PHQ - 2 Score 0 0 6 3 6   Altered sleeping 0 0 0 3 3  Tired, decreased energy 0 3 3 3 3   Change in appetite 0 0 3 0 0  Feeling bad or failure about yourself  0 0 0 0 0  Trouble concentrating 0 0 0 1 0  Moving slowly or fidgety/restless 0 0 0 0 0  Suicidal thoughts 0 0 0 0 0  PHQ-9 Score 0 3 12 10 12   Difficult doing work/chores - - - Not difficult at all Somewhat difficult  Some recent data might be hidden    phq 9 is negative   Fall Risk: Fall Risk  05/21/2021 01/18/2021 09/18/2020 05/21/2020 04/18/2020  Falls in the past year? 1 0 0 0 0  Comment - - - - -  Number falls in past yr: 0 0 0 0 -  Injury with Fall? 1 0 0 0 -  Comment - - - - -  Risk for fall due to : No Fall Risks - - - No Fall Risks  Risk for fall due to: Comment - - - - -  Follow up Falls prevention discussed - - - Falls evaluation completed      Functional Status Survey: Is the patient deaf or have difficulty hearing?: No Does the patient have difficulty seeing, even when wearing glasses/contacts?: No Does the patient have difficulty concentrating, remembering, or making decisions?: No Does the patient have difficulty walking or climbing stairs?: No Does the patient have difficulty dressing or bathing?: No Does the patient have difficulty doing errands alone such as visiting a doctor's office or shopping?: No    Assessment & Plan  1. Need for  immunization against influenza  - Flu Vaccine QUAD 6+ mos PF IM (Fluarix Quad  PF)  2. Kidney stone  - Calculi, with Photograph (to Clinical Lab)  3. Depression, major, in remission (HCC)  - DULoxetine (CYMBALTA) 60 MG capsule; Take 1 capsule (60 mg total) by mouth daily.  Dispense: 90 capsule; Refill: 1  4. Insomnia, persistent  - eszopiclone (LUNESTA) 2 MG TABS tablet; Take 1 tablet (2 mg total) by mouth at bedtime. Take immediately before bedtime  Dispense: 90 tablet; Refill: 1  5. Menopausal symptom   6. Vitamin B12 deficiency   7. Chronic pain syndrome   8. Seronegative rheumatoid arthritis (HCC)   9. Vitamin D deficiency   10. Chronic low back pain without sciatica, unspecified back pain laterality   11. Pure hypercholesterolemia   12. Raynaud's disease without gangrene   13. Left foot pain  Likely strain, doing better

## 2021-05-21 ENCOUNTER — Ambulatory Visit: Payer: Managed Care, Other (non HMO) | Admitting: Family Medicine

## 2021-05-21 ENCOUNTER — Other Ambulatory Visit: Payer: Self-pay

## 2021-05-21 ENCOUNTER — Encounter: Payer: Self-pay | Admitting: Family Medicine

## 2021-05-21 VITALS — BP 128/82 | HR 79 | Temp 97.9°F | Resp 16 | Ht 64.0 in | Wt 135.0 lb

## 2021-05-21 DIAGNOSIS — E7800 Pure hypercholesterolemia, unspecified: Secondary | ICD-10-CM

## 2021-05-21 DIAGNOSIS — E559 Vitamin D deficiency, unspecified: Secondary | ICD-10-CM

## 2021-05-21 DIAGNOSIS — F325 Major depressive disorder, single episode, in full remission: Secondary | ICD-10-CM | POA: Diagnosis not present

## 2021-05-21 DIAGNOSIS — Z23 Encounter for immunization: Secondary | ICD-10-CM

## 2021-05-21 DIAGNOSIS — G8929 Other chronic pain: Secondary | ICD-10-CM

## 2021-05-21 DIAGNOSIS — G4700 Insomnia, unspecified: Secondary | ICD-10-CM

## 2021-05-21 DIAGNOSIS — N200 Calculus of kidney: Secondary | ICD-10-CM

## 2021-05-21 DIAGNOSIS — G894 Chronic pain syndrome: Secondary | ICD-10-CM

## 2021-05-21 DIAGNOSIS — E538 Deficiency of other specified B group vitamins: Secondary | ICD-10-CM

## 2021-05-21 DIAGNOSIS — M79672 Pain in left foot: Secondary | ICD-10-CM

## 2021-05-21 DIAGNOSIS — N951 Menopausal and female climacteric states: Secondary | ICD-10-CM

## 2021-05-21 DIAGNOSIS — I7300 Raynaud's syndrome without gangrene: Secondary | ICD-10-CM

## 2021-05-21 DIAGNOSIS — M0600 Rheumatoid arthritis without rheumatoid factor, unspecified site: Secondary | ICD-10-CM

## 2021-05-21 DIAGNOSIS — M545 Low back pain, unspecified: Secondary | ICD-10-CM

## 2021-05-21 MED ORDER — DULOXETINE HCL 60 MG PO CPEP
60.0000 mg | ORAL_CAPSULE | Freq: Every day | ORAL | 1 refills | Status: DC
Start: 2021-05-21 — End: 2021-09-09

## 2021-05-21 MED ORDER — ESZOPICLONE 2 MG PO TABS
2.0000 mg | ORAL_TABLET | Freq: Every day | ORAL | 1 refills | Status: DC
Start: 2021-05-21 — End: 2021-09-10

## 2021-05-28 LAB — CALCULI, WITH PHOTOGRAPH (CLINICAL LAB)
Calcium Oxalate Dihydrate: 40 %
Calcium Oxalate Monohydrate: 50 %
Hydroxyapatite: 10 %
Weight Calculi: 46 mg

## 2021-07-01 ENCOUNTER — Other Ambulatory Visit: Payer: Self-pay | Admitting: Family Medicine

## 2021-07-01 DIAGNOSIS — N951 Menopausal and female climacteric states: Secondary | ICD-10-CM

## 2021-07-02 ENCOUNTER — Other Ambulatory Visit: Payer: Self-pay | Admitting: Family Medicine

## 2021-07-02 DIAGNOSIS — Z1231 Encounter for screening mammogram for malignant neoplasm of breast: Secondary | ICD-10-CM

## 2021-07-19 ENCOUNTER — Other Ambulatory Visit: Payer: Self-pay

## 2021-07-19 ENCOUNTER — Ambulatory Visit
Admission: RE | Admit: 2021-07-19 | Discharge: 2021-07-19 | Disposition: A | Discharge status: Home / Self Care | Payer: Managed Care, Other (non HMO) | Source: Ambulatory Visit | Attending: Family Medicine | Admitting: Family Medicine

## 2021-07-19 DIAGNOSIS — Z1231 Encounter for screening mammogram for malignant neoplasm of breast: Secondary | ICD-10-CM | POA: Diagnosis present

## 2021-09-06 ENCOUNTER — Encounter: Payer: Self-pay | Admitting: Family Medicine

## 2021-09-09 ENCOUNTER — Other Ambulatory Visit: Payer: Self-pay | Admitting: Family Medicine

## 2021-09-09 DIAGNOSIS — F325 Major depressive disorder, single episode, in full remission: Secondary | ICD-10-CM

## 2021-09-09 DIAGNOSIS — G4700 Insomnia, unspecified: Secondary | ICD-10-CM

## 2021-09-09 NOTE — Telephone Encounter (Signed)
Medication: eszopiclone (LUNESTA) 2 MG TABS tablet [[id number]], DULoxetine (CYMBALTA) 60 MG capsule [[id number]]  ? ?Has the patient contacted their pharmacy? YES advised to contact the office,  ?(Agent: If no, request that the patient contact the pharmacy for the refill. If patient does not wish to contact the pharmacy document the reason why and proceed with request.) ?(Agent: If yes, when and what did the pharmacy advise?) ? ?Preferred Pharmacy (with phone number or street name): [id number] [place], [place] - [address] AT [facility] OF [address] ?[address] [place] [address] ?Phone: [phone] Fax: [phone] ?Hours: Not open 24 hours ? ? ?Has the patient been seen for an appointment in the last year OR does the patient have an upcoming appointment? YES 10/21/21 ? ?Agent: Please be advised that RX refills may take up to 3 business days. We ask that you follow-up with your pharmacy. ?

## 2021-09-10 ENCOUNTER — Other Ambulatory Visit: Payer: Self-pay

## 2021-09-10 DIAGNOSIS — G4700 Insomnia, unspecified: Secondary | ICD-10-CM

## 2021-09-10 MED ORDER — DULOXETINE HCL 60 MG PO CPEP: 60.0000 mg | ORAL_CAPSULE | Freq: Every day | ORAL | 0 refills | Status: DC

## 2021-09-10 MED ORDER — ESZOPICLONE 2 MG PO TABS: 2.0000 mg | ORAL_TABLET | Freq: Every day | ORAL | 0 refills | Status: DC

## 2021-09-10 NOTE — Telephone Encounter (Signed)
Requested medication (s) are due for refill today: yes ? ?Requested medication (s) are on the active medication list: yes ? ?Last refill:  05/21/21 #90/1 ? ?Future visit scheduled: yes ? ?Notes to clinic:  Unable to refill per protocol, cannot delegate. ? ? ? ?  ?Requested Prescriptions  ?Pending Prescriptions Disp Refills  ? eszopiclone (LUNESTA) 2 MG TABS tablet 90 tablet 1  ?  Sig: Take 1 tablet (2 mg total) by mouth at bedtime. Take immediately before bedtime  ?  ? Not Delegated - Psychiatry:  Anxiolytics/Hypnotics Failed - 09/09/2021 11:10 AM  ?  ?  Failed - This refill cannot be delegated  ?  ?  Failed - Urine Drug Screen completed in last 360 days  ?  ?  Passed - Valid encounter within last 6 months  ?  Recent Outpatient Visits   ? ?      ? 3 months ago Depression, major, in remission (HCC)  ? [facility] Sowles, Krichna, MD  ? 7 months ago Seronegative rheumatoid arthritis (HCC)  ? [facility] Sowles, Krichna, MD  ? 11 months ago Fatigue, unspecified type  ? [facility] Sowles, Krichna, MD  ? 1 year ago Depression, major, recurrent, moderate (HCC)  ? [facility] Sowles, Krichna, MD  ? 1 year ago Anxiety  ? [facility] Tapia, Leisa, PA-C  ? ?  ?  ?Future Appointments   ? ?        ? In 1 month Sowles, Krichna, MD [facility], [facility]  ? ?  ? ?  ?  ?  ?Signed Prescriptions Disp Refills  ? DULoxetine (CYMBALTA) 60 MG capsule 90 capsule 0  ?  Sig: Take 1 capsule (60 mg total) by mouth daily.  ?  ? Psychiatry: Antidepressants - SNRI - duloxetine Passed - 09/09/2021 11:10 AM  ?  ?  Passed - Cr in normal range and within 360 days  ?  Creatinine, Ser  ?Date Value Ref Range Status  ?09/18/2020 0.89 0.57 - 1.00 mg/dL Final  ?  ?  ?  ?  Passed - eGFR is 30 or above and within 360 days  ?  GFR calc Af Amer  ?Date Value Ref Range Status  ?03/09/2020 >60 >60 mL/min Final  ? ?GFR calc non Af Amer  ?Date Value Ref  Range Status  ?03/09/2020 >60 >60 mL/min Final  ? ?eGFR  ?Date Value Ref Range Status  ?09/18/2020 75 >59 mL/min/1.73 Final  ?  ?  ?  ?  Passed - Completed PHQ-2 or PHQ-9 in the last 360 days  ?  ?  Passed - Last BP in normal range  ?  BP Readings from Last 1 Encounters:  ?05/21/21 128/82  ?  ?  ?  ?  Passed - Valid encounter within last 6 months  ?  Recent Outpatient Visits   ? ?      ? 3 months ago Depression, major, in remission (HCC)  ? [facility] Sowles, Krichna, MD  ? 7 months ago Seronegative rheumatoid arthritis (HCC)  ? [facility] Sowles, Krichna, MD  ? 11 months ago Fatigue, unspecified type  ? [facility] Sowles, Krichna, MD  ? 1 year ago Depression, major, recurrent, moderate (HCC)  ? [facility] Sowles, Krichna, MD  ? 1 year ago Anxiety  ? [facility] Tapia, Leisa, PA-C  ? ?  ?  ?  Future Appointments   ? ?        ? In 1 month Steele Sizer, MD [facility], [place]  ? ?  ? ?  ?  ?  ? ?

## 2021-09-10 NOTE — Telephone Encounter (Signed)
Requested Prescriptions  ?Pending Prescriptions Disp Refills  ?? DULoxetine (CYMBALTA) 60 MG capsule 90 capsule 1  ?  Sig: Take 1 capsule (60 mg total) by mouth daily.  ?  ? Psychiatry: Antidepressants - SNRI - duloxetine Passed - 09/09/2021 11:10 AM  ?  ?  Passed - Cr in normal range and within 360 days  ?  Creatinine, Ser  ?Date Value Ref Range Status  ?09/18/2020 0.89 0.57 - 1.00 mg/dL Final  ?   ?  ?  Passed - eGFR is 30 or above and within 360 days  ?  GFR calc Af Amer  ?Date Value Ref Range Status  ?03/09/2020 >60 >60 mL/min Final  ? ?GFR calc non Af Amer  ?Date Value Ref Range Status  ?03/09/2020 >60 >60 mL/min Final  ? ?eGFR  ?Date Value Ref Range Status  ?09/18/2020 75 >59 mL/min/1.73 Final  ?   ?  ?  Passed - Completed PHQ-2 or PHQ-9 in the last 360 days  ?  ?  Passed - Last BP in normal range  ?  BP Readings from Last 1 Encounters:  ?05/21/21 128/82  ?   ?  ?  Passed - Valid encounter within last 6 months  ?  Recent Outpatient Visits   ?      ? 3 months ago Depression, major, in remission ([place])  ? [facility] [place], Drue Stager, MD  ? 7 months ago Seronegative rheumatoid arthritis ([place])  ? [facility] [place], Drue Stager, MD  ? 11 months ago Fatigue, unspecified type  ? [facility] [place], Drue Stager, MD  ? 1 year ago Depression, major, recurrent, moderate ([place])  ? [facility] Steele Sizer, MD  ? 1 year ago Anxiety  ? [facility] Delsa Grana, [place]  ?  ?  ?Future Appointments   ?        ? In 1 month Steele Sizer, MD [facility]  ?  ? ?  ?  ?  ?? eszopiclone (LUNESTA) 2 MG TABS tablet 90 tablet 1  ?  Sig: Take 1 tablet (2 mg total) by mouth at bedtime. Take immediately before bedtime  ?  ? Not Delegated - Psychiatry:  Anxiolytics/Hypnotics Failed - 09/09/2021 11:10 AM  ?  ?  Failed - This refill cannot be delegated  ?  ?  Failed - Urine Drug Screen completed in last 360 days  ?  ?  Passed -  Valid encounter within last 6 months  ?  Recent Outpatient Visits   ?      ? 3 months ago Depression, major, in remission ([place])  ? [facility] [place], Drue Stager, MD  ? 7 months ago Seronegative rheumatoid arthritis ([place])  ? [facility] [place], Drue Stager, MD  ? 11 months ago Fatigue, unspecified type  ? [facility] [place], Drue Stager, MD  ? 1 year ago Depression, major, recurrent, moderate ([place])  ? [facility] Steele Sizer, MD  ? 1 year ago Anxiety  ? [facility] Delsa Grana, [place]  ?  ?  ?Future Appointments   ?        ? In 1 month Steele Sizer, MD [facility], [place]  ?  ? ?  ?  ?  ? ? ?

## 2021-09-22 IMAGING — CT CT RENAL STONE PROTOCOL
2 of 4 series · 16 of 46 positions shown, 18 images · non-contrast
Comparison: None.

CLINICAL DATA: Left upper quadrant pain radiating to the back

EXAM:
CT ABDOMEN AND PELVIS WITHOUT CONTRAST
TECHNIQUE: Multidetector CT imaging of the abdomen and pelvis was performed
following the standard protocol without IV contrast.

[Series 2: stone full standard · axial · 0.66mm/px · z∈[-401,+14]mm · 13 of 91 slices shown, 15 images]
[im 4/91  soft-tissue]
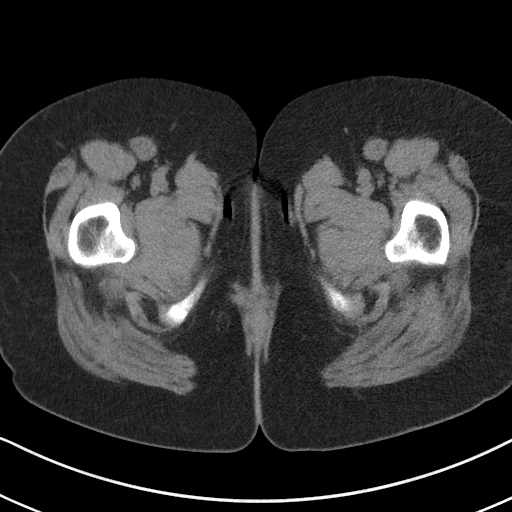
[im 4/91  bone]
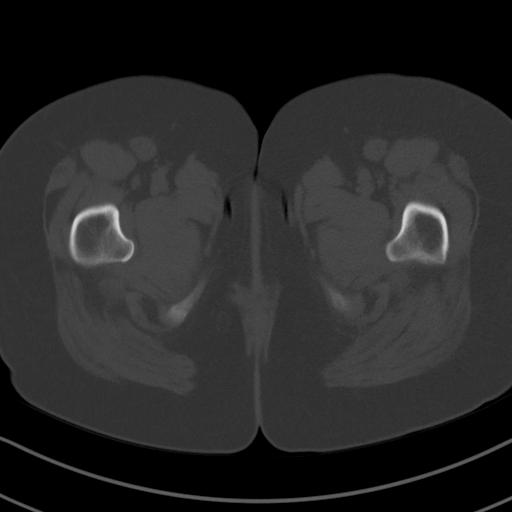
[im 12/91  soft-tissue]
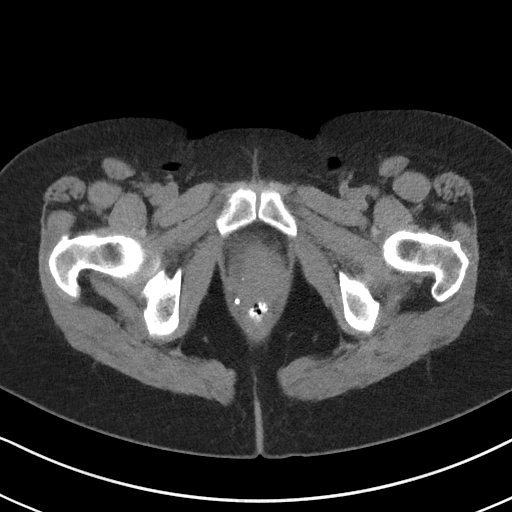
[im 19/91  soft-tissue]
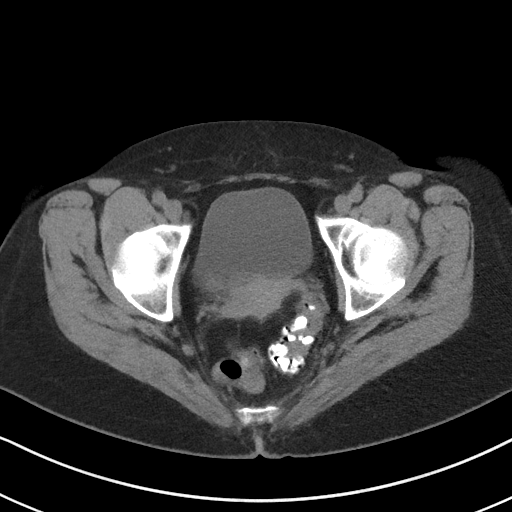
[im 27/91  soft-tissue]
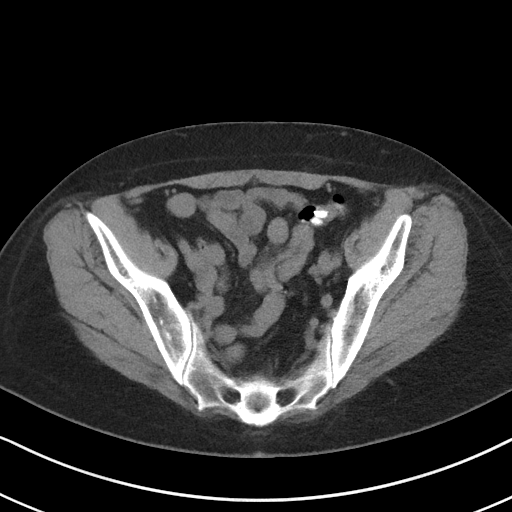
[im 31/91  soft-tissue]
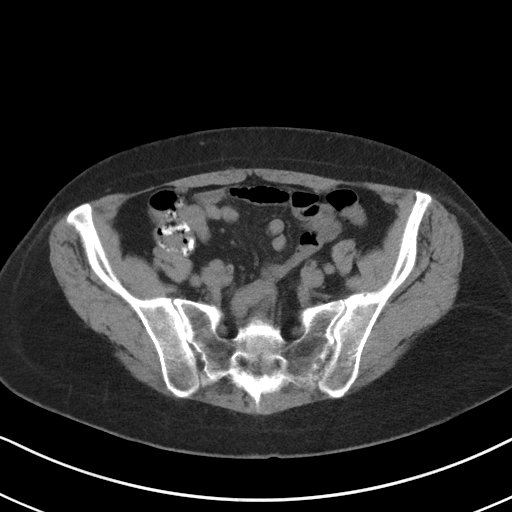
[im 38/91  soft-tissue]
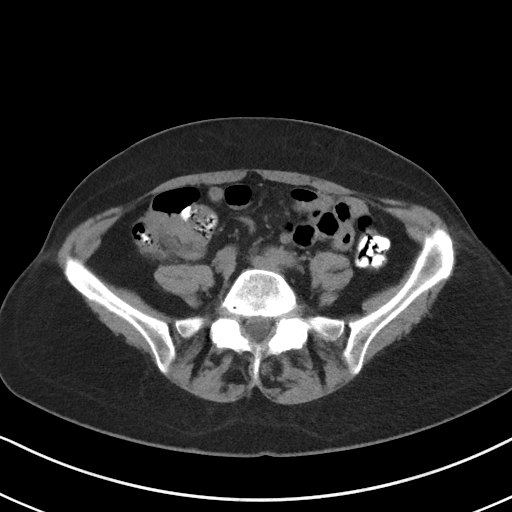
[im 46/91  soft-tissue]
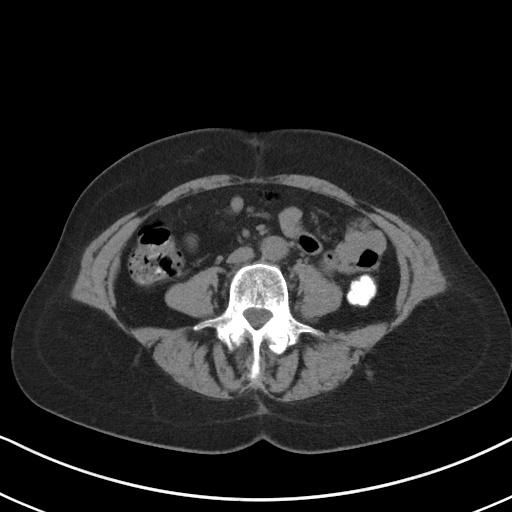
[im 53/91  soft-tissue]
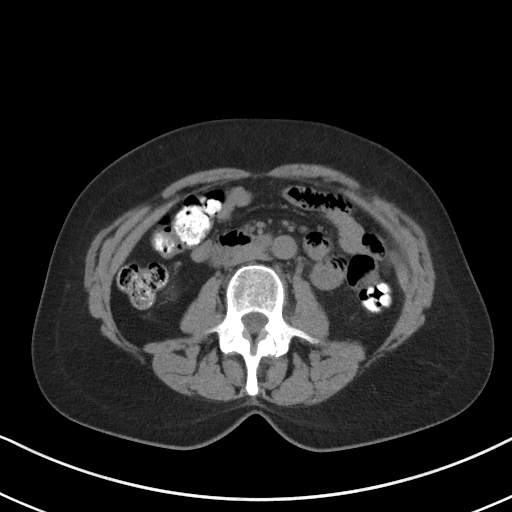
[im 61/91  soft-tissue]
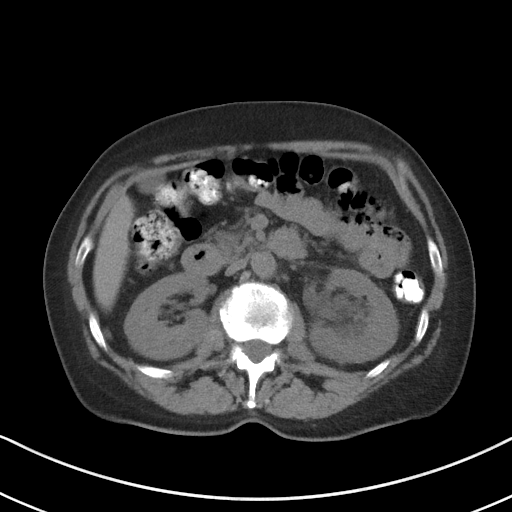
[im 61/91  bone]
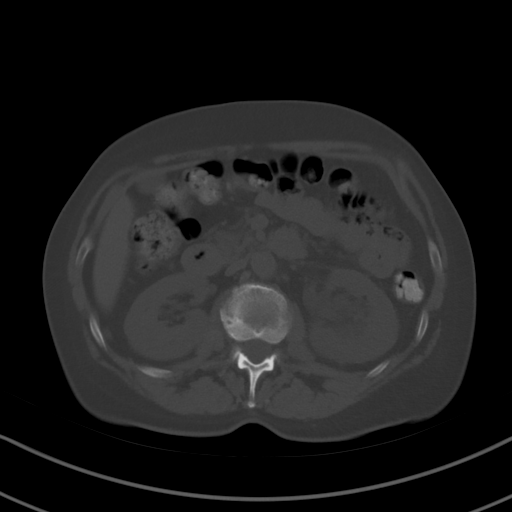
[im 64/91  soft-tissue]
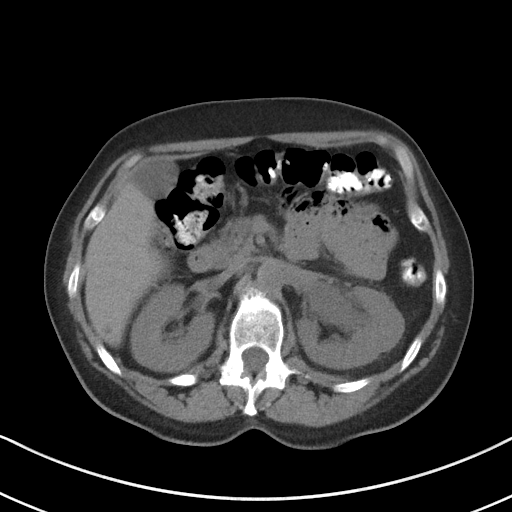
[im 72/91  soft-tissue]
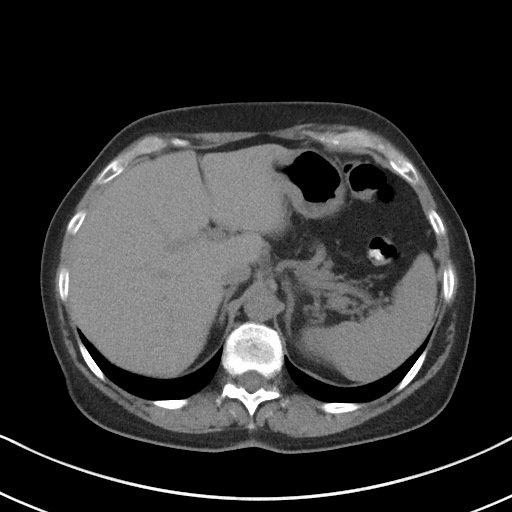
[im 79/91  soft-tissue]
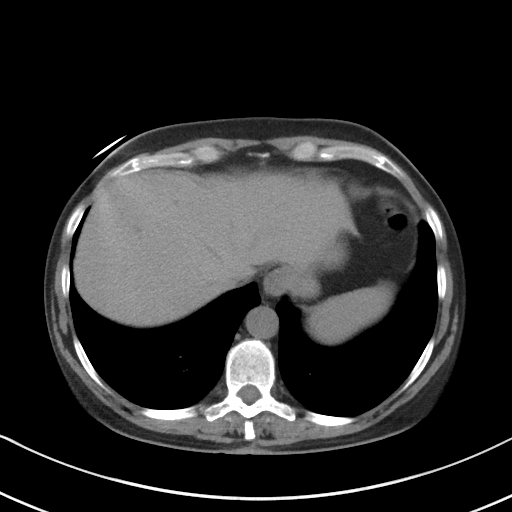
[im 87/91  soft-tissue]
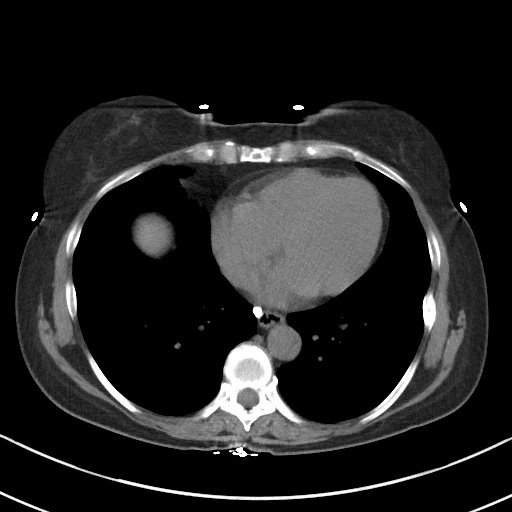

[Series 5: coronal · coronal · 0.64mm/px · 3 of 114 slices shown]
[im 38/114  soft-tissue]
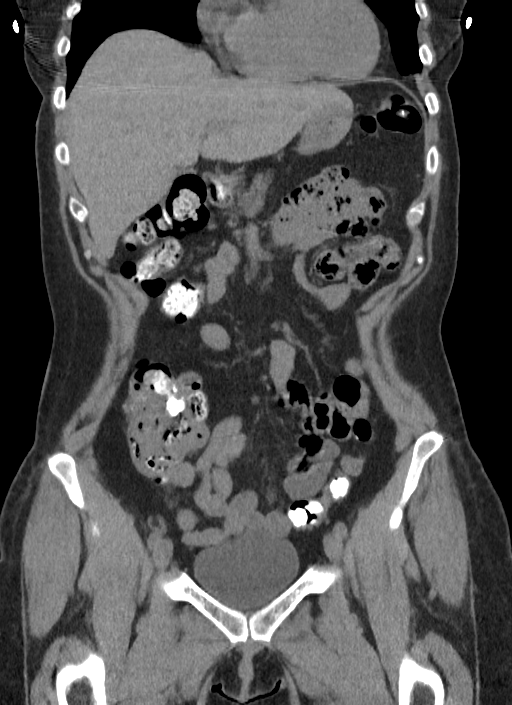
[im 51/114  soft-tissue]
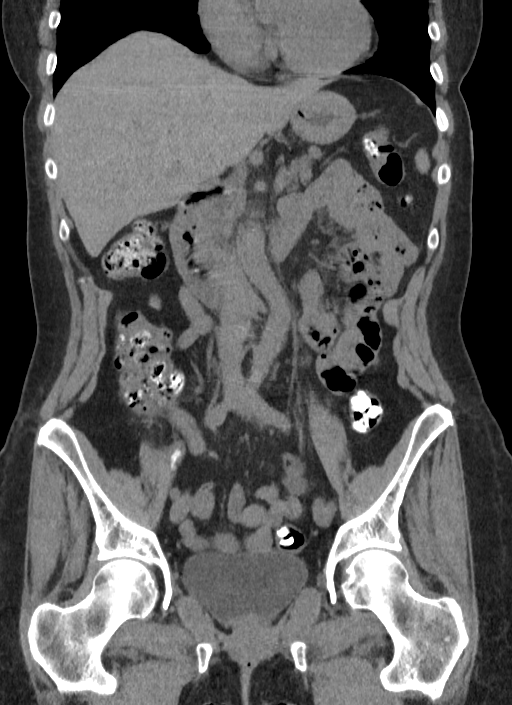
[im 63/114  soft-tissue]
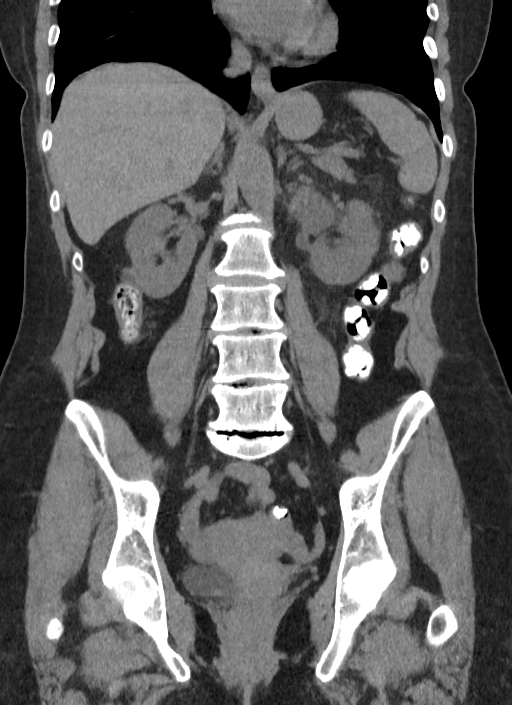

[16 of 46 positions shown; findings below may reference images not displayed]

FINDINGS: Lower chest: No acute abnormality.

Hepatobiliary: Too small to characterize hypoattenuating lesion of
the inferior right hepatic lobe. Liver is otherwise unremarkable.
Gallbladder is unremarkable. No biliary dilatation.

Pancreas: Unremarkable.

Spleen: Unremarkable.

Adrenals/Urinary Tract: Adrenals and right kidney are unremarkable.
There is left hydronephrosis. An obstructing calculus is present
within the proximal left ureter measuring 3 mm. Bladder is
unremarkable.

Stomach/Bowel: Stomach is within normal limits. Bowel is normal in
caliber. Normal appendix.

Vascular/Lymphatic: No significant vascular abnormality on this
noncontrast study. There are no enlarged lymph identified

Reproductive: Uterus and bilateral adnexa are unremarkable.

Other: No ascites.  Abdominal wall is unremarkable.

Musculoskeletal: Degenerative changes of the lumbar spine. No acute
osseous abnormality.
IMPRESSION: Obstructing 3 mm calculus of the proximal left ureter. Left
hydronephrosis.

## 2021-10-18 NOTE — Progress Notes (Signed)
Name: Candice Guzman   MRN: [id number]    DOB: [birth year]   Date:10/21/2021 ? ?     Progress Note ? ?Subjective ? ?Chief Complaint ? ?Follow Up ? ?HPI ? ?Seronegative Rheumatoid Arthritis: She has been off Simponi for years  because insurance stopped paying for it. She is back on Humira and since May 2018 off methotrexate because of hair loss. Dr. Gavin Potters retired and she has not seen a new provider yet.  ? ?Sinusitis: she had some sore throat initially followed by  facial pressure, fatigue and post-nasal drainage for the past two weeks. She initially thought it was from a tick bite but not so sure now. She also states had two tick bites recently  ? ?History of kidney stone: sit was a calcium stone and discussed starting on diuretics, but no recent episodes - bp is towards low end of normal and we will hold off on adding a diuretic at this time ?  ?Insomnia: she has been on Lunesta for a while now since Ambien no longer on formulary, she states she has been sleeping okay with Alfonso Patten, gets about 6 hours however since URI she has not been sleeping well  ?  ?Major Depression:  She states mood is affected by constant pain, phq 9 today is high, she states feeling more tired than usual over the past couple but much worse over the past couple of weeks, she states not sure if depressed or just sick and not feeling well.  ?  ?Menopause: she is now on Clonidine BID   she was using topical drysol on axilla but ran out - prescribed by Dermatologist. She states doing better, sweating is not as severe now   Unchanged  ?  ?Back pain: she states she has a constant lower back tightness ,she saw Dr. Joanna Puff for back injections for DDD but insurance denied any more coverage. Still takes Tramadol prn, off gabapentin and lyrica because of side effects . Recently feeling worse due to fatigue and laying down more than usual. ? ?Raynaud's: no recent episodes.   ? ?Dyslipidemia: her LDL was 178 March 2022, sent her rx for Atorvastatin  but she never started, afraid of side effects. .She has not really changed her diet but decreasing portion size.  She has a significant family history of heart disease. Discussed possible familial hyperlipidemia.  She had labs at work band does not want to have repeat labs today  ? ?Patient Active Problem List  ? Diagnosis Date Noted  ? 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 Pain in joint of right shoulder 01/18/2019  ? Stiffness of right shoulder joint 01/18/2019  ? Muscle weakness 01/18/2019  ? 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 Moderate episode of recurrent major depressive disorder (HCC) 08/27/2018  ? Foot drop (Right) 08/04/2018  ? Abnormal MRI, lumbar spine (06/21/2018) 08/04/2018  ? Chronic lumbar radicular pain (L5 dermatome) (Bilateral) (R>L) 05/26/2018  ? Long term prescription benzodiazepine use 09/21/2017  ? Chronic lower extremity pain (1ry area of Pain) (Bilateral) (R>L) 09/21/2017  ? Chronic low back pain (2ry area of Pain) (Bilateral) (R>L) 09/21/2017  ? Pharmacologic therapy 09/21/2017  ? Problems influencing health status 09/21/2017  ? Spondylosis without myelopathy or radiculopathy, lumbosacral region 09/21/2017  ? Chronic hip pain (3ry area of Pain) (Bilateral) (R>L) 09/21/2017  ? Chronic shoulder pain (Bilateral) (L>R) 09/21/2017  ? Grade 1 Anterolisthesis of L5 over S1 & L3 over L4 09/21/2017  ? Grade 1 Retrolisthesis L1 over L2 & L2  over L3 09/21/2017  ? DDD (degenerative disc disease), lumbar 09/21/2017  ? 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) 09/21/2017  ? Lumbar facet joint syndrome (Bilateral) (R>L) 09/21/2017  ? Chronic musculoskeletal pain 09/21/2017  ? Neurogenic pain 09/21/2017  ? Lumbar foraminal stenosis (L5-S1) (Bilateral) 09/21/2017  ? Lumbar lateral recess stenosis (Bilateral) (B: L4-5) (L>R: L3-4) 09/21/2017  ? Lumbar facet hypertrophy (Bilateral: L3-4, L4-5, and L5-S1) 09/21/2017  ? Chronic pain syndrome 07/08/2017  ? Chronic knee pain (4th  area of Pain) (Bilateral) 07/08/2017  ? Chronic wrist pain (Fifth Area of Pain) (Bilateral) 07/08/2017  ? Disorder of skeletal system 07/08/2017  ? Other long term (current) drug therapy 07/08/2017  ? Long term current use of opiate analgesic 07/08/2017  ? Other specified health status 07/08/2017  ? Chronic pain of multiple joints 05/20/2017  ? Insomnia, persistent 12/18/2014  ? Depression, major, single episode, moderate (HCC) 12/18/2014  ? Menopausal symptom 12/18/2014  ? Vitamin D insufficiency 12/18/2014  ? 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 Lumbosacral radiculopathy (L5) (Right) 12/12/2013  ? Bulge of lumbar disc without myelopathy 06/18/2011  ? ? ?Past Surgical History:  ?Procedure Laterality Date  ? BREAST BIOPSY Left 2010  ? neg  ? CESAREAN SECTION    ? FINGER ARTHROPLASTY Left 07/24/2017  ? Thumb  ? GANGLION CYST EXCISION    ? METACARPOPHALANGEAL JOINT ARTHROPLASTY Right 06/06/2016  ? Dr. Dayna Barker   ? SHOULDER ARTHROSCOPY WITH ROTATOR CUFF REPAIR Right 02/24/2019  ? Procedure: RIGHT SHOULDER ARTHROSCOPY WITH SUBACROMIAL DECOMPRESSION, DISTAL CLAVICLE EXCISION, LYSIS OF ADHESIONS,CAPSULOTOMY,MINI OPEN ROTATOR CUFF REPAIR;  Surgeon: Juanell Fairly, MD;  Location: [facility] ORS;  Service: Orthopedics;  Laterality: Right;  ? ? ?Family History  ?Problem Relation Age of Onset  ? Emphysema Mother   ? Cancer Father 13  ?     lung  ? Cancer Brother   ?     tongue  ? ? ?Social History  ? ?Tobacco Use  ? Smoking status: Never  ? Smokeless tobacco: Never  ?Substance Use Topics  ? Alcohol use: No  ?  Alcohol/week: 0.0 standard drinks  ? ? ? ?Current Outpatient Medications:  ?  ALPRAZolam (XANAX) 0.5 MG tablet, TAKE 1 TABLET(0.5 MG) BY MOUTH AT BEDTIME AS NEEDED, Disp: 30 tablet, Rfl: 0 ?  cloNIDine (CATAPRES) 0.1 MG tablet, TAKE 1 TABLET BY MOUTH  TWICE DAILY, Disp: 180 tablet, Rfl: 1 ?  DULoxetine (CYMBALTA) 60 MG capsule, Take 1 capsule (60 mg total) by mouth daily., Disp: 90 capsule, Rfl: 0 ?  eszopiclone  (LUNESTA) 2 MG TABS tablet, Take 1 tablet (2 mg total) by mouth at bedtime. Take immediately before bedtime, Disp: 30 tablet, Rfl: 0 ?  HUMIRA PEN 40 MG/0.8ML PNKT, Inject 40 mg into the skin every 14 (fourteen) days. , Disp: , Rfl: 3 ?  tiZANidine (ZANAFLEX) 4 MG tablet, Take 1 tablet (4 mg total) by mouth every 8 (eight) hours as needed for muscle spasms. Must last 30 days, Disp: 90 tablet, Rfl: 2 ?  traMADol (ULTRAM) 50 MG tablet, Take 1 tablet (50 mg total) by mouth every 12 (twelve) hours as needed for severe pain. Must last 30 days, Disp: 60 tablet, Rfl: 2 ? ?Allergies  ?Allergen Reactions  ? Codeine Itching  ? Methotrexate Nausea Only  ? ? ?I personally reviewed active problem list, medication list, allergies, family history, social history, health maintenance with the patient/caregiver today. ? ? ?ROS ? ?Constitutional: Negative for fever or weight change.  ?Respiratory: Negative for  cough and shortness of breath.   ?Cardiovascular: Negative for chest pain or palpitations.  ?Gastrointestinal: Negative for abdominal pain, no bowel changes.  ?Musculoskeletal: Positive for muscle aches  ?Skin: Negative for rash.  ?Neurological: Negative for dizziness or headache.  ?No other specific complaints in a complete review of systems (except as listed in HPI above).  ? ?Objective ? ?Vitals:  ? 10/21/21 1453  ?BP: 121/84  ?Pulse: 91  ?Resp: 16  ?SpO2: 99%  ?Weight: 139 lb (63 kg)  ?Height: 5\' 4"  (1.626 m)  ? ? ?Body mass index is 23.86 kg/m?. ? ?Physical Exam ? ?Constitutional: Patient appears well-developed and well-nourished.  No distress.  ?HEENT: head atraumatic, normocephalic, pupils equal and reactive to light, ear normal bilateral,  neck supple, no tenderness to percussion of sinus today  ?Cardiovascular: Normal rate, regular rhythm and normal heart sounds.  No murmur heard. No BLE edema. ?Pulmonary/Chest: Effort normal and breath sounds normal. No respiratory distress. ?Abdominal: Soft.  There is no  tenderness. ?Psychiatric: Patient has a normal mood and affect. behavior is normal. Judgment and thought content normal.  ? ?PHQ2/9: ? ?  10/21/2021  ?  2:53 PM 05/21/2021  ?  2:19 PM 01/18/2021  ?  3:38 PM 09/18/2020  ?  1:

## 2021-10-21 ENCOUNTER — Encounter: Payer: Self-pay | Admitting: Family Medicine

## 2021-10-21 ENCOUNTER — Ambulatory Visit: Payer: Managed Care, Other (non HMO) | Admitting: Family Medicine

## 2021-10-21 VITALS — BP 121/84 | HR 91 | Resp 16 | Ht 64.0 in | Wt 139.0 lb

## 2021-10-21 DIAGNOSIS — E538 Deficiency of other specified B group vitamins: Secondary | ICD-10-CM

## 2021-10-21 DIAGNOSIS — M545 Low back pain, unspecified: Secondary | ICD-10-CM

## 2021-10-21 DIAGNOSIS — Z1211 Encounter for screening for malignant neoplasm of colon: Secondary | ICD-10-CM

## 2021-10-21 DIAGNOSIS — E559 Vitamin D deficiency, unspecified: Secondary | ICD-10-CM

## 2021-10-21 DIAGNOSIS — J0110 Acute frontal sinusitis, unspecified: Secondary | ICD-10-CM | POA: Diagnosis not present

## 2021-10-21 DIAGNOSIS — I7300 Raynaud's syndrome without gangrene: Secondary | ICD-10-CM

## 2021-10-21 DIAGNOSIS — G894 Chronic pain syndrome: Secondary | ICD-10-CM

## 2021-10-21 DIAGNOSIS — M0600 Rheumatoid arthritis without rheumatoid factor, unspecified site: Secondary | ICD-10-CM | POA: Diagnosis not present

## 2021-10-21 DIAGNOSIS — N951 Menopausal and female climacteric states: Secondary | ICD-10-CM | POA: Diagnosis not present

## 2021-10-21 DIAGNOSIS — G4700 Insomnia, unspecified: Secondary | ICD-10-CM

## 2021-10-21 DIAGNOSIS — L940 Localized scleroderma [morphea]: Secondary | ICD-10-CM

## 2021-10-21 DIAGNOSIS — G8929 Other chronic pain: Secondary | ICD-10-CM

## 2021-10-21 DIAGNOSIS — F419 Anxiety disorder, unspecified: Secondary | ICD-10-CM

## 2021-10-21 DIAGNOSIS — E7800 Pure hypercholesterolemia, unspecified: Secondary | ICD-10-CM

## 2021-10-21 DIAGNOSIS — F330 Major depressive disorder, recurrent, mild: Secondary | ICD-10-CM | POA: Diagnosis not present

## 2021-10-21 DIAGNOSIS — K743 Primary biliary cirrhosis: Secondary | ICD-10-CM

## 2021-10-21 MED ORDER — ESZOPICLONE 2 MG PO TABS: 2.0000 mg | ORAL_TABLET | Freq: Every day | ORAL | 0 refills | Status: DC

## 2021-10-21 MED ORDER — DOXYCYCLINE HYCLATE 100 MG PO TABS: 100.0000 mg | ORAL_TABLET | Freq: Two times a day (BID) | ORAL | 0 refills | Status: DC

## 2021-10-21 MED ORDER — CLONIDINE HCL 0.1 MG PO TABS: 0.1000 mg | ORAL_TABLET | Freq: Two times a day (BID) | ORAL | 1 refills | Status: DC

## 2021-10-21 MED ORDER — DULOXETINE HCL 60 MG PO CPEP: 60.0000 mg | ORAL_CAPSULE | Freq: Every day | ORAL | 1 refills | Status: DC

## 2021-11-11 LAB — COLOGUARD: COLOGUARD: NEGATIVE

## 2022-01-30 ENCOUNTER — Telehealth: Payer: Self-pay | Admitting: Family Medicine

## 2022-01-30 DIAGNOSIS — G4700 Insomnia, unspecified: Secondary | ICD-10-CM

## 2022-02-10 NOTE — Telephone Encounter (Addendum)
Pt is calling back, requesting an update on medication refill. Pt made an appointment with Dr. Carlynn Purl, first available on 02/17/2022. Pt is asking if possible for courtesy refill until her appointment. Pt mentioned has been out of medication for a week and a half.    Please advise.

## 2022-02-10 NOTE — Telephone Encounter (Signed)
Called pt to ask what medication she is needing refill on. No answer from pt left vm to return call.

## 2022-02-10 NOTE — Telephone Encounter (Deleted)
Pt is calling back requesting an update o medication refill . Pt made an appointment with Dr.Sowles first available 02/17/2022. Pt is asking if possible for courtesy until her appointment.   [phone]

## 2022-02-14 NOTE — Progress Notes (Unsigned)
Name: Candice Guzman   MRN: [id number]    DOB: 05/08/1961   Date:02/17/2022       Progress Note  Subjective  Chief Complaint  Medication Refill  HPI  Seronegative Rheumatoid Arthritis: She has been off Simponi for years  because insurance stopped paying for it. She was on Humira and since May 2018 until early 2023 but ran out about 3 months ago and does not want to resume medications, she states feels better without it. She will follow up with Rheumatologist to discuss other options she has been  off methotrexate because of hair loss.Raynold, no recent episodes  History of kidney stone: sit was a calcium stone and discussed starting on diuretics, but no recent episodes - bp was low at one point and it was not started. No recent episodes    Insomnia: she has been on Lunesta for a while now since Ambien no longer on formulary, she states Lunesta no longer working, sleeping at most 2 hours per night. We will try giving her Temazepam, since she has tried a half dose of alprazolam when out of lunesta and it helped with her sleep.    Major Depression:  She states mood is affected by constant pain, phq 9 today is high but slightly better than last time, energy is slightly better  Bunion : noticing pain on both feet, sharp pain on bunion, sometimes burning and is causing pain with pressure but also activity  Menopause: she is now on Clonidine BID   she was using topical drysol on axilla but ran out - prescribed by Dermatologist. She states doing better, sweating is not as severe now  Stable    Back pain: she states she has a constant lower back tightness ,she saw Dr. Cleda Mccreedy for back injections for DDD but insurance denied any more coverage. She was taking tramadol and tizanidine but it did not help and she stopped going to see him   Dyslipidemia: her LDL was 178 March 2022, sent her rx for Atorvastatin but she never started, afraid of side effects. .She has not really changed her diet but  decreasing portion size.  She has a significant family history of heart disease. Discussed possible familial hyperlipidemia.  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   Pain in joint of right shoulder 01/18/2019   Stiffness of right shoulder joint 01/18/2019   Muscle weakness 01/18/2019   Osteoarthritis involving multiple joints 12/07/2018   Moderate episode of recurrent major depressive disorder ([place]) 08/27/2018   Foot drop (Right) 08/04/2018   Abnormal MRI, lumbar spine (06/21/2018) 08/04/2018   Chronic lumbar radicular pain (L5 dermatome) (Bilateral) (R>L) 05/26/2018   Long term prescription benzodiazepine use 09/21/2017   Chronic lower extremity pain (1ry area of Pain) (Bilateral) (R>L) 09/21/2017   Chronic low back pain (2ry area of Pain) (Bilateral) (R>L) 09/21/2017   Pharmacologic therapy 09/21/2017   Problems influencing health status 09/21/2017   Spondylosis without myelopathy or radiculopathy, lumbosacral region 09/21/2017   Chronic hip pain (3ry area of Pain) (Bilateral) (R>L) 09/21/2017   Chronic shoulder pain (Bilateral) (L>R) 09/21/2017   Grade 1 Anterolisthesis of L5 over S1 & L3 over L4 09/21/2017   Grade 1 Retrolisthesis L1 over L2 & L2 over L3 09/21/2017   DDD (degenerative disc disease), lumbar 09/21/2017   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) 09/21/2017   Lumbar facet joint syndrome (Bilateral) (R>L) 09/21/2017  Chronic musculoskeletal pain 09/21/2017   Neurogenic pain 09/21/2017   Lumbar foraminal stenosis (L5-S1) (Bilateral) 09/21/2017   Lumbar lateral recess stenosis (Bilateral) (B: L4-5) (L>R: L3-4) 09/21/2017   Lumbar facet hypertrophy (Bilateral: L3-4, L4-5, and L5-S1) 09/21/2017   Chronic pain syndrome 07/08/2017   Chronic knee pain (4th area of Pain) (Bilateral) 07/08/2017   Chronic wrist pain (Fifth Area of Pain) (Bilateral) 07/08/2017   Disorder of skeletal  system 07/08/2017   Other long term (current) drug therapy 07/08/2017   Long term current use of opiate analgesic 07/08/2017   Other specified health status 07/08/2017   Chronic pain of multiple joints 05/20/2017   Insomnia, persistent 12/18/2014   Depression, major, single episode, moderate (HCC) 12/18/2014   Menopausal symptom 12/18/2014   Vitamin D insufficiency 12/18/2014   Inflammatory arthritis (HCC) 02/23/2014   Lumbosacral radiculopathy (L5) (Right) 12/12/2013   Bulge of lumbar disc without myelopathy 06/18/2011    Past Surgical History:  Procedure Laterality Date   BREAST BIOPSY Left 2010   neg   CESAREAN SECTION     FINGER ARTHROPLASTY Left 07/24/2017   Thumb   GANGLION CYST EXCISION     METACARPOPHALANGEAL JOINT ARTHROPLASTY Right 06/06/2016   Dr. Dayna Barker    SHOULDER ARTHROSCOPY WITH ROTATOR CUFF REPAIR Right 02/24/2019   Procedure: RIGHT SHOULDER ARTHROSCOPY WITH SUBACROMIAL DECOMPRESSION, DISTAL CLAVICLE EXCISION, LYSIS OF ADHESIONS,CAPSULOTOMY,MINI OPEN ROTATOR CUFF REPAIR;  Surgeon: Juanell Fairly, MD;  Location: [facility] ORS;  Service: Orthopedics;  Laterality: Right;    Family History  Problem Relation Age of Onset   Emphysema Mother    Cancer Father 79       lung   Cancer Brother        tongue    Social History   Tobacco Use   Smoking status: Never   Smokeless tobacco: Never  Substance Use Topics   Alcohol use: No    Alcohol/week: 0.0 standard drinks of alcohol     Current Outpatient Medications:    ALPRAZolam (XANAX) 0.5 MG tablet, TAKE 1 TABLET(0.5 MG) BY MOUTH AT BEDTIME AS NEEDED, Disp: 30 tablet, Rfl: 0   cloNIDine (CATAPRES) 0.1 MG tablet, Take 1 tablet (0.1 mg total) by mouth 2 (two) times daily., Disp: 180 tablet, Rfl: 1   doxycycline (VIBRA-TABS) 100 MG tablet, Take 1 tablet (100 mg total) by mouth 2 (two) times daily., Disp: 20 tablet, Rfl: 0   DULoxetine (CYMBALTA) 60 MG capsule, Take 1 capsule (60 mg total) by mouth daily., Disp: 90  capsule, Rfl: 1   eszopiclone (LUNESTA) 2 MG TABS tablet, Take 1 tablet (2 mg total) by mouth at bedtime. Take immediately before bedtime, Disp: 90 tablet, Rfl: 0   HUMIRA PEN 40 MG/0.8ML PNKT, Inject 40 mg into the skin every 14 (fourteen) days. , Disp: , Rfl: 3   tiZANidine (ZANAFLEX) 4 MG tablet, Take 1 tablet (4 mg total) by mouth every 8 (eight) hours as needed for muscle spasms. Must last 30 days, Disp: 90 tablet, Rfl: 2   traMADol (ULTRAM) 50 MG tablet, Take 1 tablet (50 mg total) by mouth every 12 (twelve) hours as needed for severe pain. Must last 30 days, Disp: 60 tablet, Rfl: 2  Allergies  Allergen Reactions   Codeine Itching   Methotrexate Nausea Only    I personally reviewed active problem list, medication list, allergies, family history, social history, health maintenance with the patient/caregiver today.   ROS  Constitutional: Negative for fever or weight change.  Respiratory: Negative for cough and shortness  of breath.   Cardiovascular: Negative for chest pain or palpitations.  Gastrointestinal: Negative for abdominal pain, no bowel changes.  Musculoskeletal: Negative for gait problem or joint swelling.  Skin: Negative for rash.  Neurological: Negative for dizziness or headache.  No other specific complaints in a complete review of systems (except as listed in HPI above).   Objective  Vitals:   02/17/22 1014  BP: 132/80  Pulse: 91  Resp: 16  SpO2: 96%  Weight: 140 lb (63.5 kg)  Height: 5\' 4"  (1.626 m)    Body mass index is 24.03 kg/m.  Physical Exam  Constitutional: Patient appears well-developed and well-nourished.  No distress.  HEENT: head atraumatic, normocephalic, pupils equal and reactive to light, neck supple Cardiovascular: Normal rate, regular rhythm and normal heart sounds.  No murmur heard. No BLE edema. Pulmonary/Chest: Effort normal and breath sounds normal. No respiratory distress. Abdominal: Soft.  There is no tenderness. Muscular  Skeletal: PIP joint of left 5th finger is swollen and tender, stiffness when making a fist  Psychiatric: Patient has a normal mood and affect. behavior is normal. Judgment and thought content normal.   PHQ2/9:    02/17/2022   10:14 AM 10/21/2021    2:53 PM 05/21/2021    2:19 PM 01/18/2021    3:38 PM 09/18/2020    1:28 PM  Depression screen PHQ 2/9  Decreased Interest 2 3 0 0 3  Down, Depressed, Hopeless 2 3 0 0 3  PHQ - 2 Score 4 6 0 0 6  Altered sleeping 3 3 0 0 0  Tired, decreased energy 0 3 0 3 3  Change in appetite 0 0 0 0 3  Feeling bad or failure about yourself  0 0 0 0 0  Trouble concentrating 0 0 0 0 0  Moving slowly or fidgety/restless 0 0 0 0 0  Suicidal thoughts 0 0 0 0 0  PHQ-9 Score 7 12 0 3 12    phq 9 is positive   Fall Risk:    02/17/2022   10:13 AM 10/21/2021    2:53 PM 05/21/2021    2:18 PM 01/18/2021    3:38 PM 09/18/2020    1:28 PM  Fall Risk   Falls in the past year? 1 0 1 0 0  Number falls in past yr: 1 0 0 0 0  Injury with Fall? 0 0 1 0 0  Risk for fall due to : No Fall Risks No Fall Risks No Fall Risks    Follow up Falls prevention discussed Falls prevention discussed Falls prevention discussed        Functional Status Survey: Is the patient deaf or have difficulty hearing?: No Does the patient have difficulty seeing, even when wearing glasses/contacts?: No Does the patient have difficulty concentrating, remembering, or making decisions?: No Does the patient have difficulty walking or climbing stairs?: Yes Does the patient have difficulty dressing or bathing?: No Does the patient have difficulty doing errands alone such as visiting a doctor's office or shopping?: No    Assessment & Plan  1. Seronegative rheumatoid arthritis (HCC)  - C-reactive protein - Sedimentation rate  She will contact rheumatologist for follow up  2. Raynaud's disease without gangrene  Doing well at this time   3. Mild episode of recurrent major depressive  disorder (HCC)   4. Vitamin D deficiency  - VITAMIN D 25 Hydroxy (Vit-D Deficiency, Fractures)  5. Chronic pain syndrome   6. Pure hypercholesterolemia  - Lipid panel  7. Vitamin B12 deficiency  - Vitamin B12  8. Insomnia, persistent  - temazepam (RESTORIL) 15 MG capsule; Take 1 capsule (15 mg total) by mouth at bedtime as needed for sleep.  Dispense: 30 capsule; Refill: 0  9. Bilateral bunions  - Ambulatory referral to Podiatry  10. Anxiety  - ALPRAZolam (XANAX) 0.5 MG tablet; TAKE 1 TABLET(0.5 MG) BY MOUTH AT BEDTIME AS NEEDED  Dispense: 30 tablet; Refill: 0  11. Insomnia, persistent  - temazepam (RESTORIL) 15 MG capsule; Take 1 capsule (15 mg total) by mouth at bedtime as needed for sleep.  Dispense: 30 capsule; Refill: 0  12. Long-term use of high-risk medication  - CBC with Differential/Platelet - Comprehensive metabolic panel  13. Diabetes mellitus screening  - Hemoglobin A1c

## 2022-02-17 ENCOUNTER — Encounter: Payer: Self-pay | Admitting: Family Medicine

## 2022-02-17 ENCOUNTER — Ambulatory Visit: Payer: Managed Care, Other (non HMO) | Admitting: Family Medicine

## 2022-02-17 VITALS — BP 132/80 | HR 91 | Resp 16 | Ht 64.0 in | Wt 140.0 lb

## 2022-02-17 DIAGNOSIS — F330 Major depressive disorder, recurrent, mild: Secondary | ICD-10-CM | POA: Diagnosis not present

## 2022-02-17 DIAGNOSIS — M0600 Rheumatoid arthritis without rheumatoid factor, unspecified site: Secondary | ICD-10-CM | POA: Diagnosis not present

## 2022-02-17 DIAGNOSIS — M21611 Bunion of right foot: Secondary | ICD-10-CM

## 2022-02-17 DIAGNOSIS — G894 Chronic pain syndrome: Secondary | ICD-10-CM

## 2022-02-17 DIAGNOSIS — E538 Deficiency of other specified B group vitamins: Secondary | ICD-10-CM

## 2022-02-17 DIAGNOSIS — E559 Vitamin D deficiency, unspecified: Secondary | ICD-10-CM

## 2022-02-17 DIAGNOSIS — E7800 Pure hypercholesterolemia, unspecified: Secondary | ICD-10-CM | POA: Insufficient documentation

## 2022-02-17 DIAGNOSIS — G4700 Insomnia, unspecified: Secondary | ICD-10-CM

## 2022-02-17 DIAGNOSIS — I7300 Raynaud's syndrome without gangrene: Secondary | ICD-10-CM

## 2022-02-17 DIAGNOSIS — Z79899 Other long term (current) drug therapy: Secondary | ICD-10-CM

## 2022-02-17 DIAGNOSIS — F419 Anxiety disorder, unspecified: Secondary | ICD-10-CM

## 2022-02-17 DIAGNOSIS — Z131 Encounter for screening for diabetes mellitus: Secondary | ICD-10-CM

## 2022-02-17 DIAGNOSIS — M21612 Bunion of left foot: Secondary | ICD-10-CM

## 2022-02-17 MED ORDER — ALPRAZOLAM 0.5 MG PO TABS: ORAL_TABLET | ORAL | 0 refills | Status: DC

## 2022-02-17 MED ORDER — TEMAZEPAM 15 MG PO CAPS: 15.0000 mg | ORAL_CAPSULE | Freq: Every evening | ORAL | 0 refills | Status: DC | PRN

## 2022-02-18 LAB — CBC WITH DIFFERENTIAL/PLATELET
Basophils Absolute: 0.1 x10E3/uL (ref 0.0–0.2)
Basos: 1 %
EOS (ABSOLUTE): 0.5 x10E3/uL — ABNORMAL HIGH (ref 0.0–0.4)
Eos: 10 %
Hematocrit: 42 % (ref 34.0–46.6)
Hemoglobin: 14 g/dL (ref 11.1–15.9)
Immature Grans (Abs): 0 x10E3/uL (ref 0.0–0.1)
Immature Granulocytes: 0 %
Lymphocytes Absolute: 2 x10E3/uL (ref 0.7–3.1)
Lymphs: 37 %
MCH: 29.1 pg (ref 26.6–33.0)
MCHC: 33.3 g/dL (ref 31.5–35.7)
MCV: 87 fL (ref 79–97)
Monocytes Absolute: 0.4 x10E3/uL (ref 0.1–0.9)
Monocytes: 8 %
Neutrophils Absolute: 2.4 x10E3/uL (ref 1.4–7.0)
Neutrophils: 44 %
Platelets: 308 x10E3/uL (ref 150–450)
RBC: 4.81 x10E6/uL (ref 3.77–5.28)
RDW: 12.7 % (ref 11.7–15.4)
WBC: 5.5 x10E3/uL (ref 3.4–10.8)

## 2022-02-18 LAB — COMPREHENSIVE METABOLIC PANEL WITH GFR
ALT: 12 IU/L (ref 0–32)
AST: 21 IU/L (ref 0–40)
Albumin/Globulin Ratio: 2 (ref 1.2–2.2)
Albumin: 4.3 g/dL (ref 3.8–4.9)
Alkaline Phosphatase: 138 IU/L — ABNORMAL HIGH (ref 44–121)
BUN/Creatinine Ratio: 26 (ref 12–28)
BUN: 19 mg/dL (ref 8–27)
Bilirubin Total: <0.2 mg/dL (ref 0.0–1.2)
CO2: 25 mmol/L (ref 20–29)
Calcium: 10.1 mg/dL (ref 8.7–10.3)
Chloride: 103 mmol/L (ref 96–106)
Creatinine, Ser: 0.73 mg/dL (ref 0.57–1.00)
Globulin, Total: 2.2 g/dL (ref 1.5–4.5)
Glucose: 79 mg/dL (ref 70–99)
Potassium: 4.6 mmol/L (ref 3.5–5.2)
Sodium: 140 mmol/L (ref 134–144)
Total Protein: 6.5 g/dL (ref 6.0–8.5)
eGFR: 94 mL/min/1.73

## 2022-02-18 LAB — VITAMIN D 25 HYDROXY (VIT D DEFICIENCY, FRACTURES): Vit D, 25-Hydroxy: 25.5 ng/mL — ABNORMAL LOW (ref 30.0–100.0)

## 2022-02-18 LAB — LIPID PANEL
Chol/HDL Ratio: 5.1 ratio — ABNORMAL HIGH (ref 0.0–4.4)
Cholesterol, Total: 285 mg/dL — ABNORMAL HIGH (ref 100–199)
HDL: 56 mg/dL (ref >39)
LDL Chol Calc (NIH): 207 mg/dL — ABNORMAL HIGH (ref 0–99)
Triglycerides: 123 mg/dL (ref 0–149)
VLDL Cholesterol Cal: 22 mg/dL (ref 5–40)

## 2022-02-18 LAB — C-REACTIVE PROTEIN: CRP: 1 mg/L (ref 0–10)

## 2022-02-18 LAB — VITAMIN B12: Vitamin B-12: 431 pg/mL (ref 232–1245)

## 2022-02-18 LAB — HEMOGLOBIN A1C
Est. average glucose Bld gHb Est-mCnc: 103 mg/dL
Hgb A1c MFr Bld: 5.2 % (ref 4.8–5.6)

## 2022-02-18 LAB — SEDIMENTATION RATE: Sed Rate: 3 mm/h (ref 0–40)

## 2022-02-21 ENCOUNTER — Other Ambulatory Visit: Payer: Self-pay | Admitting: Family Medicine

## 2022-02-21 DIAGNOSIS — N951 Menopausal and female climacteric states: Secondary | ICD-10-CM

## 2022-02-24 ENCOUNTER — Other Ambulatory Visit: Payer: Self-pay | Admitting: Family Medicine

## 2022-02-24 MED ORDER — ROSUVASTATIN CALCIUM 20 MG PO TABS: 20.0000 mg | ORAL_TABLET | Freq: Every day | ORAL | 0 refills | Status: DC

## 2022-03-14 ENCOUNTER — Other Ambulatory Visit: Payer: Self-pay | Admitting: Family Medicine

## 2022-03-14 DIAGNOSIS — G4700 Insomnia, unspecified: Secondary | ICD-10-CM

## 2022-04-23 ENCOUNTER — Other Ambulatory Visit: Payer: Self-pay | Admitting: Family Medicine

## 2022-04-23 ENCOUNTER — Ambulatory Visit: Payer: Self-pay | Admitting: *Deleted

## 2022-04-23 MED ORDER — QUETIAPINE FUMARATE 25 MG PO TABS: 25.0000 mg | ORAL_TABLET | Freq: Every day | ORAL | 0 refills | Status: DC

## 2022-04-23 NOTE — Telephone Encounter (Signed)
  Chief Complaint: wants to change sleep/insomnia medication Symptoms: trouble falling asleep and wakes during sleep cycle Frequency: several months Pertinent Negatives: Patient denies   Disposition: [] ED /[] Urgent Care (no appt availability in office) / [] Appointment(In office/virtual)/ []  [facility]/ [] Home Care/ [] Refused Recommended Disposition /[] [facility]/ [x]  Follow-up with PCP Additional Notes: Medication change request

## 2022-04-23 NOTE — Telephone Encounter (Signed)
Summary: Medication Change   Patient states that she has been taking temazepam (RESTORIL) 15 MG capsule for a few months and it does not seem to be working and patient would like to go back to using eszopiclone (LUNESTA) 2 MG TABS tablet.     Reason for Disposition  [1] Caller has URGENT medicine question about med that PCP or specialist prescribed AND [2] triager unable to answer question  Answer Assessment - Initial Assessment Questions 1. NAME of MEDICINE: "What medicine(s) are you calling about?"     Temazepam-Restoril 2. QUESTION: "What is your question?" (e.g., double dose of medicine, side effect)     Not working for sleep- trouble falling asleep- taking longer to fall asleep, trouble staying asleep- wakes after 2 hours and hard to fall back to sleep. Using melatonin to help- wants to go back to Lunesta 3. PRESCRIBER: "Who prescribed the medicine?" Reason: if prescribed by specialist, call should be referred to that group.     PCP 4. SYMPTOMS: "Do you have any symptoms?" If Yes, ask: "What symptoms are you having?"  "How bad are the symptoms (e.g., mild, moderate, severe)     See above- medication not working well- wants to go back to eszopiclone-Lunesta  Protocols used: Medication Question Call-A-AH

## 2022-06-17 ENCOUNTER — Other Ambulatory Visit: Payer: Self-pay | Admitting: Family Medicine

## 2022-06-17 DIAGNOSIS — F330 Major depressive disorder, recurrent, mild: Secondary | ICD-10-CM

## 2022-08-05 ENCOUNTER — Other Ambulatory Visit: Payer: Self-pay | Admitting: Family Medicine

## 2022-08-05 DIAGNOSIS — N951 Menopausal and female climacteric states: Secondary | ICD-10-CM

## 2022-08-19 NOTE — Progress Notes (Signed)
Name: Candice Guzman   MRN: [id number]    DOB: 04-Aug-1960   Date:08/20/2022       Progress Note  Subjective  Chief Complaint  Follow Up  HPI  Seronegative Rheumatoid Arthritis: She could not tolerate methotrexate due to nausea and hair loss, she is back on Humira , she still has daily that currently 2/10 but worse in the mornings with a lot of stiffness and hand swelling. She states she has been taking meloxicam and ibuprofen, explained same medications, needs to stop one of them.   History of kidney stone: sit was a calcium stone and discussed starting on diuretics, but no recent episodes - bp was low at one point and it was not started. No episodes in a whle    Insomnia: she has been on Lunesta for a while now since Ambien no longer on formulary, she states Lunesta no longer working, sleeping at most 2 hours per night. She took Temazepam, she is now on Seroquel but states only able to sleep for about 3 hours before she wakes up due to pain, it can take up to one hour to fall back asleep   Major Depression:  She states mood is affected by constant pain, phq 9 is stable, taking Duloxetine daily and also seroquel for sleep . No side effects  Bunion : noticing pain on both feet, sharp pain on bunion, sometimes burning and is causing pain with pressure but also activity  Menopause: she is now on Clonidine BID   she was using topical drysol on axilla but ran out - prescribed by Dermatologist. Stable on medication   Back pain: she states she has a constant lower back tightness ,she saw Dr. Cleda Mccreedy for back injections for DDD but insurance denied any more coverage. She was taking tramadol and has some old rx at home. She is now seeing Dr. Phyllis Ginger   Dyslipidemia: her LDL was 178 March 2022, sent her rx for Atorvastatin but she never started, afraid of side effects, we switched to Crestor but has not been taking it daily either . She has a significant family history of heart disease. Discussed  possible familial hyperlipidemia.   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   Pain in joint of right shoulder 01/18/2019   Stiffness of right shoulder joint 01/18/2019   Muscle weakness 01/18/2019   Osteoarthritis involving multiple joints 12/07/2018   Moderate episode of recurrent major depressive disorder ([place]) 08/27/2018   Foot drop (Right) 08/04/2018   Abnormal MRI, lumbar spine (06/21/2018) 08/04/2018   Chronic lumbar radicular pain (L5 dermatome) (Bilateral) (R>L) 05/26/2018   Long term prescription benzodiazepine use 09/21/2017   Chronic lower extremity pain (1ry area of Pain) (Bilateral) (R>L) 09/21/2017   Chronic low back pain (2ry area of Pain) (Bilateral) (R>L) 09/21/2017   Pharmacologic therapy 09/21/2017   Problems influencing health status 09/21/2017   Spondylosis without myelopathy or radiculopathy, lumbosacral region 09/21/2017   Chronic hip pain (3ry area of Pain) (Bilateral) (R>L) 09/21/2017   Chronic shoulder pain (Bilateral) (L>R) 09/21/2017   Grade 1 Anterolisthesis of L5 over S1 & L3 over L4 09/21/2017   Grade 1 Retrolisthesis L1 over L2 & L2 over L3 09/21/2017   DDD (degenerative disc disease),  lumbar 09/21/2017   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) 09/21/2017   Lumbar facet joint syndrome (Bilateral) (R>L) 09/21/2017   Chronic musculoskeletal pain 09/21/2017   Neurogenic pain 09/21/2017   Lumbar foraminal stenosis (L5-S1) (Bilateral) 09/21/2017   Lumbar lateral recess stenosis (Bilateral) (B: L4-5) (L>R: L3-4) 09/21/2017   Lumbar facet hypertrophy (Bilateral: L3-4, L4-5, and L5-S1) 09/21/2017   Chronic pain syndrome 07/08/2017   Chronic  knee pain (4th area of Pain) (Bilateral) 07/08/2017   Chronic wrist pain (Fifth Area of Pain) (Bilateral) 07/08/2017   Disorder of skeletal system 07/08/2017   Other long term (current) drug therapy 07/08/2017   Long term current use of opiate analgesic 07/08/2017   Other specified health status 07/08/2017   Chronic pain of multiple joints 05/20/2017   Insomnia, persistent 12/18/2014   Depression, major, single episode, moderate ([place]) 12/18/2014   Menopausal symptom 12/18/2014   Vitamin D insufficiency 12/18/2014   Inflammatory arthritis ([place]) 02/23/2014   Lumbosacral radiculopathy (L5) (Right) 12/12/2013   Bulge of lumbar disc without myelopathy 06/18/2011    Past Surgical History:  Procedure Laterality Date   BREAST BIOPSY Left 2010   neg   CESAREAN SECTION     FINGER ARTHROPLASTY Left 07/24/2017   Thumb   GANGLION CYST EXCISION     METACARPOPHALANGEAL JOINT ARTHROPLASTY Right 06/06/2016   Dr. Astrid Divine    SHOULDER ARTHROSCOPY WITH ROTATOR CUFF REPAIR Right 02/24/2019   Procedure: RIGHT SHOULDER ARTHROSCOPY WITH SUBACROMIAL DECOMPRESSION, DISTAL CLAVICLE EXCISION, LYSIS OF ADHESIONS,CAPSULOTOMY,MINI OPEN ROTATOR CUFF REPAIR;  Surgeon: Thornton Park, MD;  Location: [facility] ORS;  Service: Orthopedics;  Laterality: Right;    Family History  Problem Relation Age of Onset   Emphysema Mother    Cancer Father 4       lung   Cancer Brother        tongue    Social History   Tobacco Use   Smoking status: Never   Smokeless tobacco: Never  Substance Use Topics   Alcohol use: No    Alcohol/week: 0.0 standard drinks of alcohol     Current Outpatient Medications:    cloNIDine (CATAPRES) 0.1 MG tablet, TAKE 1 TABLET BY MOUTH TWICE  DAILY, Disp: 180 tablet, Rfl: 0   DULoxetine (CYMBALTA) 60 MG capsule, TAKE 1 CAPSULE(60 MG) BY MOUTH DAILY, Disp: 90 capsule, Rfl: 0   QUEtiapine (SEROQUEL) 25 MG tablet, Take 1 tablet (25 mg total) by mouth at bedtime., Disp: 90 tablet, Rfl: 0    rosuvastatin (CRESTOR) 20 MG tablet, Take 1 tablet (20 mg total) by mouth daily., Disp: 90 tablet, Rfl: 0  Allergies  Allergen Reactions   Codeine Itching   Methotrexate Nausea Only    I personally reviewed active problem list, medication list, allergies, family history, social history, health maintenance with the patient/caregiver today.   ROS  Ten systems reviewed and is negative except as mentioned in HPI   Objective  Vitals:   08/20/22 1455  BP: 130/74  Pulse: 88  Resp: 16  SpO2: 98%  Weight: 143 lb (64.9 kg)  Height: 5' 4"$  (1.626 m)    Body mass index is 24.55 kg/m.  Physical Exam  Constitutional: Patient appears well-developed and well-nourished.  No distress.  HEENT: head atraumatic, normocephalic, pupils equal and reactive to light, neck supple Cardiovascular: Normal rate, regular rhythm and normal heart sounds.  No murmur heard. No BLE edema. Pulmonary/Chest: Effort normal and breath sounds normal. No respiratory distress. Abdominal: Soft.  There is no tenderness. Muscular skeletal:  abnormalities on hands , some synovitis of left finger  Psychiatric: Patient has a normal mood and affect. behavior is normal. Judgment and thought content normal.   PHQ2/9:    08/20/2022    2:55 PM 02/17/2022   10:14 AM 10/21/2021    2:53 PM 05/21/2021    2:19 PM 01/18/2021    3:38 PM  Depression screen PHQ 2/9  Decreased Interest 1 2 3 $ 0 0  Down, Depressed, Hopeless 1 2 3 $ 0 0  PHQ - 2 Score 2 4 6 $ 0 0  Altered sleeping 3 3 3 $ 0 0  Tired, decreased energy 3 0 3 0 3  Change in appetite 0 0 0 0 0  Feeling bad or failure about yourself  0 0 0 0 0  Trouble concentrating 0 0 0 0 0  Moving slowly or fidgety/restless 0 0 0 0 0  Suicidal thoughts 0 0 0 0 0  PHQ-9 Score 8 7 12 $ 0 3    phq 9 is positive   Fall Risk:    08/20/2022    2:55 PM 02/17/2022   10:13 AM 10/21/2021    2:53 PM 05/21/2021    2:18 PM 01/18/2021    3:38 PM  Fall Risk   Falls in the past year? 0 1 0 1 0   Number falls in past yr: 0 1 0 0 0  Injury with Fall? 0 0 0 1 0  Risk for fall due to : No Fall Risks No Fall Risks No Fall Risks No Fall Risks   Follow up Falls prevention discussed Falls prevention discussed Falls prevention discussed Falls prevention discussed       Functional Status Survey: Is the patient deaf or have difficulty hearing?: No Does the patient have difficulty seeing, even when wearing glasses/contacts?: No Does the patient have difficulty concentrating, remembering, or making decisions?: No Does the patient have difficulty walking or climbing stairs?: Yes Does the patient have difficulty dressing or bathing?: Yes Does the patient have difficulty doing errands alone such as visiting a doctor's office or shopping?: No    Assessment & Plan  1. Seronegative rheumatoid arthritis ([place])  Keep follow up with Rheumatologist   2. Mild episode of recurrent major depressive disorder (HCC)  - DULoxetine (CYMBALTA) 60 MG capsule; TAKE 1 CAPSULE(60 MG) BY MOUTH DAILY  Dispense: 90 capsule; Refill: 1  3. Immunosuppressed status ([place])  Discussed importance of taking vaccines but she refused, Tdap,   4. Vitamin D deficiency  Take vitamin D otc daily   5. Insomnia, persistent  Due to pain   6. Vitamin B12 deficiency  Resume B12 a few times a week  7. Pure hypercholesterolemia  She is not compliant takes it occasionally  8. Chronic pain syndrome   9. Raynaud's disease without gangrene   10. Menopausal symptom  - cloNIDine (CATAPRES) 0.1 MG tablet; Take 1 tablet (0.1 mg total) by mouth 2 (two) times daily.  Dispense: 180 tablet; Refill: 0  11. Need for Tdap vaccination  Refused   12. Breast cancer screening by mammogram  - MM 3D SCREEN BREAST BILATERAL; Future  13. Ovarian failure  - DG Bone Density; Future  14. Osteoporosis screening  - DG Bone Density; Future

## 2022-08-20 ENCOUNTER — Ambulatory Visit: Payer: Managed Care, Other (non HMO) | Admitting: Family Medicine

## 2022-08-20 ENCOUNTER — Encounter: Payer: Self-pay | Admitting: Family Medicine

## 2022-08-20 VITALS — BP 130/74 | HR 88 | Resp 16 | Ht 64.0 in | Wt 143.0 lb

## 2022-08-20 DIAGNOSIS — E559 Vitamin D deficiency, unspecified: Secondary | ICD-10-CM

## 2022-08-20 DIAGNOSIS — E2839 Other primary ovarian failure: Secondary | ICD-10-CM

## 2022-08-20 DIAGNOSIS — G894 Chronic pain syndrome: Secondary | ICD-10-CM

## 2022-08-20 DIAGNOSIS — Z1231 Encounter for screening mammogram for malignant neoplasm of breast: Secondary | ICD-10-CM

## 2022-08-20 DIAGNOSIS — M0600 Rheumatoid arthritis without rheumatoid factor, unspecified site: Secondary | ICD-10-CM

## 2022-08-20 DIAGNOSIS — E538 Deficiency of other specified B group vitamins: Secondary | ICD-10-CM

## 2022-08-20 DIAGNOSIS — F330 Major depressive disorder, recurrent, mild: Secondary | ICD-10-CM | POA: Diagnosis not present

## 2022-08-20 DIAGNOSIS — D849 Immunodeficiency, unspecified: Secondary | ICD-10-CM

## 2022-08-20 DIAGNOSIS — N951 Menopausal and female climacteric states: Secondary | ICD-10-CM

## 2022-08-20 DIAGNOSIS — Z23 Encounter for immunization: Secondary | ICD-10-CM

## 2022-08-20 DIAGNOSIS — E7800 Pure hypercholesterolemia, unspecified: Secondary | ICD-10-CM

## 2022-08-20 DIAGNOSIS — I7300 Raynaud's syndrome without gangrene: Secondary | ICD-10-CM | POA: Insufficient documentation

## 2022-08-20 DIAGNOSIS — G4700 Insomnia, unspecified: Secondary | ICD-10-CM

## 2022-08-20 DIAGNOSIS — Z13820 Encounter for screening for osteoporosis: Secondary | ICD-10-CM

## 2022-08-20 MED ORDER — CLONIDINE HCL 0.1 MG PO TABS: 0.1000 mg | ORAL_TABLET | Freq: Two times a day (BID) | ORAL | 0 refills | Status: DC

## 2022-08-20 MED ORDER — QUETIAPINE FUMARATE 25 MG PO TABS: 25.0000 mg | ORAL_TABLET | Freq: Every day | ORAL | 1 refills | Status: DC

## 2022-08-20 MED ORDER — DULOXETINE HCL 60 MG PO CPEP: ORAL_CAPSULE | ORAL | 1 refills | Status: DC

## 2022-10-01 ENCOUNTER — Other Ambulatory Visit: Payer: Self-pay | Admitting: Family Medicine

## 2022-10-01 DIAGNOSIS — F330 Major depressive disorder, recurrent, mild: Secondary | ICD-10-CM

## 2022-10-07 ENCOUNTER — Ambulatory Visit
Admission: RE | Admit: 2022-10-07 | Discharge: 2022-10-07 | Disposition: A | Discharge status: Home / Self Care | Payer: Managed Care, Other (non HMO) | Source: Ambulatory Visit | Attending: Family Medicine | Admitting: Family Medicine

## 2022-10-07 ENCOUNTER — Other Ambulatory Visit: Payer: Self-pay | Admitting: Family Medicine

## 2022-10-07 DIAGNOSIS — Z13820 Encounter for screening for osteoporosis: Secondary | ICD-10-CM | POA: Diagnosis present

## 2022-10-07 DIAGNOSIS — Z1231 Encounter for screening mammogram for malignant neoplasm of breast: Secondary | ICD-10-CM

## 2022-10-07 DIAGNOSIS — F330 Major depressive disorder, recurrent, mild: Secondary | ICD-10-CM

## 2022-10-07 DIAGNOSIS — E2839 Other primary ovarian failure: Secondary | ICD-10-CM | POA: Diagnosis not present

## 2022-10-07 MED ORDER — DULOXETINE HCL 60 MG PO CPEP: ORAL_CAPSULE | ORAL | 0 refills | Status: DC

## 2022-10-07 NOTE — Telephone Encounter (Signed)
Future visit in 1 month.  Requested Prescriptions  Pending Prescriptions Disp Refills   DULoxetine (CYMBALTA) 60 MG capsule 90 capsule 0    Sig: TAKE 1 CAPSULE(60 MG) BY MOUTH DAILY     Psychiatry: Antidepressants - SNRI - duloxetine Passed - 10/07/2022  1:22 PM      Passed - Cr in normal range and within 360 days    Creatinine, Ser  Date Value Ref Range Status  02/17/2022 0.73 0.57 - 1.00 mg/dL Final         Passed - eGFR is 30 or above and within 360 days    GFR calc Af Amer  Date Value Ref Range Status  03/09/2020 >60 >60 mL/min Final   GFR calc non Af Amer  Date Value Ref Range Status  03/09/2020 >60 >60 mL/min Final   eGFR  Date Value Ref Range Status  02/17/2022 94 >59 mL/min/1.73 Final         Passed - Completed PHQ-2 or PHQ-9 in the last 360 days      Passed - Last BP in normal range    BP Readings from Last 1 Encounters:  08/20/22 130/74         Passed - Valid encounter within last 6 months    Recent Outpatient Visits           1 month ago Seronegative rheumatoid arthritis [facility])   [facility] Steele Sizer, MD   7 months ago Seronegative rheumatoid arthritis [facility])   [facility] Steele Sizer, MD   11 months ago Mild episode of recurrent major depressive disorder [facility])   [facility] Steele Sizer, MD   1 year ago Depression, major, in remission [facility])   [facility] Steele Sizer, MD   1 year ago Seronegative rheumatoid arthritis [facility])   [facility] Steele Sizer, MD       Future Appointments             In 1 month Ancil Boozer, Drue Stager, MD [facility], [place]   In 4 months Steele Sizer, MD [facility], [facility]

## 2022-10-07 NOTE — Telephone Encounter (Signed)
Medication Refill - Medication: DULoxetine (CYMBALTA) 60 MG capsule   Has the patient contacted their pharmacy? Yes.     Preferred Pharmacy (with phone number or street name):  [facility] [id number], [place] Phone: [phone]  Fax: [phone]     Has the patient been seen for an appointment in the last year OR does the patient have an upcoming appointment? Yes.    The patient called in stating she has no idea what happened to her third refill bottle. She says she has looked everywhere for it and cannot find it. She states she is having symptoms from not having the medicine for 4 or 5 days now. Also if the provider will not fill this is there something over the counter that is comparable that may help? Please assist patient further.

## 2022-10-08 ENCOUNTER — Encounter: Payer: Self-pay | Admitting: Family Medicine

## 2022-10-08 DIAGNOSIS — Z780 Asymptomatic menopausal state: Secondary | ICD-10-CM | POA: Insufficient documentation

## 2022-10-08 DIAGNOSIS — M8580 Other specified disorders of bone density and structure, unspecified site: Secondary | ICD-10-CM | POA: Insufficient documentation

## 2022-10-12 ENCOUNTER — Other Ambulatory Visit: Payer: Self-pay | Admitting: Family Medicine

## 2022-10-12 DIAGNOSIS — N951 Menopausal and female climacteric states: Secondary | ICD-10-CM

## 2022-11-18 NOTE — Patient Instructions (Signed)
Preventive Care 40-62 Years Old, Female Preventive care refers to lifestyle choices and visits with your health care provider that can promote health and wellness. Preventive care visits are also called wellness exams. What can I expect for my preventive care visit? Counseling Your health care provider may ask you questions about your: Medical history, including: Past medical problems. Family medical history. Pregnancy history. Current health, including: Menstrual cycle. Method of birth control. Emotional well-being. Home life and relationship well-being. Sexual activity and sexual health. Lifestyle, including: Alcohol, nicotine or tobacco, and drug use. Access to firearms. Diet, exercise, and sleep habits. Work and work environment. Sunscreen use. Safety issues such as seatbelt and bike helmet use. Physical exam Your health care provider will check your: Height and weight. These may be used to calculate your BMI (body mass index). BMI is a measurement that tells if you are at a healthy weight. Waist circumference. This measures the distance around your waistline. This measurement also tells if you are at a healthy weight and may help predict your risk of certain diseases, such as type 2 diabetes and high blood pressure. Heart rate and blood pressure. Body temperature. Skin for abnormal spots. What immunizations do I need?  Vaccines are usually given at various ages, according to a schedule. Your health care provider will recommend vaccines for you based on your age, medical history, and lifestyle or other factors, such as travel or where you work. What tests do I need? Screening Your health care provider may recommend screening tests for certain conditions. This may include: Lipid and cholesterol levels. Diabetes screening. This is done by checking your blood sugar (glucose) after you have not eaten for a while (fasting). Pelvic exam and Pap test. Hepatitis B test. Hepatitis C  test. HIV (human immunodeficiency virus) test. STI (sexually transmitted infection) testing, if you are at risk. Lung cancer screening. Colorectal cancer screening. Mammogram. Talk with your health care provider about when you should start having regular mammograms. This may depend on whether you have a family history of breast cancer. BRCA-related cancer screening. This may be done if you have a family history of breast, ovarian, tubal, or peritoneal cancers. Bone density scan. This is done to screen for osteoporosis. Talk with your health care provider about your test results, treatment options, and if necessary, the need for more tests. Follow these instructions at home: Eating and drinking  Eat a diet that includes fresh fruits and vegetables, whole grains, lean protein, and low-fat dairy products. Take vitamin and mineral supplements as recommended by your health care provider. Do not drink alcohol if: Your health care provider tells you not to drink. You are pregnant, may be pregnant, or are planning to become pregnant. If you drink alcohol: Limit how much you have to 0-1 drink a day. Know how much alcohol is in your drink. In the [place] one drink equals one 12 oz bottle of beer (355 mL), one 5 oz glass of wine (148 mL), or one 1 oz glass of hard liquor (44 mL). Lifestyle Brush your teeth every morning and night with fluoride toothpaste. Floss one time each day. Exercise for at least 30 minutes 5 or more days each week. Do not use any products that contain nicotine or tobacco. These products include cigarettes, chewing tobacco, and vaping devices, such as e-cigarettes. If you need help quitting, ask your health care provider. Do not use drugs. If you are sexually active, practice safe sex. Use a condom or other form of protection to   prevent STIs. If you do not wish to become pregnant, use a form of birth control. If you plan to become pregnant, see your health care provider for a  prepregnancy visit. Take aspirin only as told by your health care provider. Make sure that you understand how much to take and what form to take. Work with your health care provider to find out whether it is safe and beneficial for you to take aspirin daily. Find healthy ways to manage stress, such as: Meditation, yoga, or listening to music. Journaling. Talking to a trusted person. Spending time with friends and family. Minimize exposure to UV radiation to reduce your risk of skin cancer. Safety Always wear your seat belt while driving or riding in a vehicle. Do not drive: If you have been drinking alcohol. Do not ride with someone who has been drinking. When you are tired or distracted. While texting. If you have been using any mind-altering substances or drugs. Wear a helmet and other protective equipment during sports activities. If you have firearms in your house, make sure you follow all gun safety procedures. Seek help if you have been physically or sexually abused. What's next? Visit your health care provider once a year for an annual wellness visit. Ask your health care provider how often you should have your eyes and teeth checked. Stay up to date on all vaccines. This information is not intended to replace advice given to you by your health care provider. Make sure you discuss any questions you have with your health care provider. Document Revised: 12/19/2020 Document Reviewed: 12/19/2020 Elsevier Patient Education  2023 Elsevier Inc.  

## 2022-11-18 NOTE — Progress Notes (Unsigned)
Name: Candice Guzman   MRN: [id number]    DOB: Aug 10, 1960   Date:11/19/2022       Progress Note  Subjective  Chief Complaint  Annual Exam  HPI  Patient presents for annual CPE.  Elevated BP, she states she is in more pain due to weather and also pulled her back. She will return next week for CMA visit and bp check if still elevated we will need to see her sooner to start on bp medication   Diet: cooks a home, eats balanced  Exercise: as tolerated due to RA  Last Eye Exam: she is due for an exam  Last Dental Exam: up to date   [employer] Visit from 11/19/2022 in [facility]  AUDIT-C Score 0      Depression: Phq 9 is  positive    11/19/2022    1:12 PM 08/20/2022    2:55 PM 02/17/2022   10:14 AM 10/21/2021    2:53 PM 05/21/2021    2:19 PM  Depression screen PHQ 2/9  Decreased Interest 1 1 2 3  0  Down, Depressed, Hopeless 1 1 2 3  0  PHQ - 2 Score 2 2 4 6  0  Altered sleeping 0 3 3 3  0  Tired, decreased energy 0 3 0 3 0  Change in appetite 0 0 0 0 0  Feeling bad or failure about yourself  0 0 0 0 0  Trouble concentrating 0 0 0 0 0  Moving slowly or fidgety/restless 0 0 0 0 0  Suicidal thoughts 0 0 0 0 0  PHQ-9 Score 2 8 7 12  0   Hypertension: BP Readings from Last 3 Encounters:  11/19/22 (!) 164/96  08/20/22 130/74  02/17/22 132/80   Obesity: Wt Readings from Last 3 Encounters:  11/19/22 145 lb (65.8 kg)  08/20/22 143 lb (64.9 kg)  02/17/22 140 lb (63.5 kg)   BMI Readings from Last 3 Encounters:  11/19/22 25.69 kg/m  08/20/22 24.55 kg/m  02/17/22 24.03 kg/m     Vaccines:   RSV: discussed with patient  Tdap: 2013 Shingrix: up to date Pneumonia: up to date Flu: 2022 COVID-19:refused    Hep C Screening: 05/13/12 STD testing and prevention (HIV/chl/gon/syphilis): N/A Intimate partner violence: negative screen  Sexual History : not sexually active in the past 4 years  Menstrual History/LMP/Abnormal Bleeding: post  menopausal  Discussed importance of follow up if any post-menopausal bleeding: yes  Incontinence Symptoms: stress incontinence - discussed Kegel exercises   Breast cancer:  - Last Mammogram: 10/07/22 - BRCA gene screening: N/A  Osteoporosis Prevention : Discussed high calcium and vitamin D supplementation, weight bearing exercises Bone density:  10/07/22  Cervical cancer screening: 06/01/17  Skin cancer: Discussed monitoring for atypical lesions  Colorectal cancer: 10/30/21   Lung cancer:  Low Dose CT Chest recommended if Age 69-80 years, 20 pack-year currently smoking OR have quit w/in 15years. Patient does not qualify for screen   ECG: 10/15/20  Advanced Care Planning: A voluntary discussion about advance care planning including the explanation and discussion of advance directives.  Discussed health care proxy and Living will, and the patient was able to identify a health care proxy as son .  Patient does not have a living will and power of attorney of health care   Lipids: Lab Results  Component Value Date   CHOL 285 (H) 02/17/2022   CHOL 265 (H) 09/18/2020   CHOL 257 (H) 11/26/2018   Lab Results  Component Value Date   HDL 56 02/17/2022   HDL 57 09/18/2020   HDL 54 11/26/2018   Lab Results  Component Value Date   LDLCALC 207 (H) 02/17/2022   LDLCALC 178 (H) 09/18/2020   LDLCALC 181 (H) 11/26/2018   Lab Results  Component Value Date   TRIG 123 02/17/2022   TRIG 162 (H) 09/18/2020   TRIG 110 11/26/2018   Lab Results  Component Value Date   CHOLHDL 5.1 (H) 02/17/2022   CHOLHDL 4.6 (H) 09/18/2020   CHOLHDL 4.8 (H) 11/26/2018   No results found for: "LDLDIRECT"  Glucose: Glucose  Date Value Ref Range Status  02/17/2022 79 70 - 99 mg/dL Final  40/98/1191 98 65 - 99 mg/dL Final   Glucose, Bld  Date Value Ref Range Status  03/09/2020 107 (H) 70 - 99 mg/dL Final    Comment:    Glucose reference range applies only to samples taken after fasting for at least 8 hours.   02/24/2020 111 (H) 70 - 99 mg/dL Final    Comment:    Glucose reference range applies only to samples taken after fasting for at least 8 hours.  02/21/2019 104 (H) 70 - 99 mg/dL Final    Patient Active Problem List   Diagnosis Date Noted   Osteopenia after menopause 10/08/2022   Raynaud's disease without gangrene 08/20/2022   Mild episode of recurrent major depressive disorder (HCC) 02/17/2022   Seronegative rheumatoid arthritis (HCC) 02/17/2022   Pure hypercholesterolemia 02/17/2022   Vitamin B12 deficiency 02/17/2022   Anxiety 02/17/2022   Bilateral bunions 02/17/2022   Pain in joint of right shoulder 01/18/2019   Stiffness of right shoulder joint 01/18/2019   Osteoarthritis involving multiple joints 12/07/2018   Moderate episode of recurrent major depressive disorder (HCC) 08/27/2018   Foot drop (Right) 08/04/2018   Abnormal MRI, lumbar spine (06/21/2018) 08/04/2018   Chronic lumbar radicular pain (L5 dermatome) (Bilateral) (R>L) 05/26/2018   Chronic lower extremity pain (1ry area of Pain) (Bilateral) (R>L) 09/21/2017   Chronic low back pain (2ry area of Pain) (Bilateral) (R>L) 09/21/2017   Spondylosis without myelopathy or radiculopathy, lumbosacral region 09/21/2017   Chronic hip pain (3ry area of Pain) (Bilateral) (R>L) 09/21/2017   Chronic shoulder pain (Bilateral) (L>R) 09/21/2017   Grade 1 Anterolisthesis of L5 over S1 & L3 over L4 09/21/2017   Grade 1 Retrolisthesis L1 over L2 & L2 over L3 09/21/2017   DDD (degenerative disc disease), lumbar 09/21/2017   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) 09/21/2017   Lumbar facet joint syndrome (Bilateral) (R>L) 09/21/2017   Chronic musculoskeletal pain 09/21/2017   Neurogenic pain 09/21/2017   Lumbar foraminal stenosis (L5-S1) (Bilateral) 09/21/2017   Lumbar lateral recess stenosis (Bilateral) (B: L4-5) (L>R: L3-4) 09/21/2017   Lumbar facet hypertrophy (Bilateral: L3-4, L4-5, and L5-S1) 09/21/2017   Chronic  pain syndrome 07/08/2017   Chronic knee pain (4th area of Pain) (Bilateral) 07/08/2017   Chronic wrist pain (Fifth Area of Pain) (Bilateral) 07/08/2017   Disorder of skeletal system 07/08/2017   Other long term (current) drug therapy 07/08/2017   Chronic pain of multiple joints 05/20/2017   Insomnia, persistent 12/18/2014   Depression, major, single episode, moderate (HCC) 12/18/2014   Menopausal symptom 12/18/2014   Vitamin D deficiency 12/18/2014   Lumbosacral radiculopathy (L5) (Right) 12/12/2013   Bulge of lumbar disc without myelopathy 06/18/2011    Past Surgical History:  Procedure Laterality Date   BREAST BIOPSY Left 2010   neg   CESAREAN  SECTION     FINGER ARTHROPLASTY Left 07/24/2017   Thumb   GANGLION CYST EXCISION     METACARPOPHALANGEAL JOINT ARTHROPLASTY Right 06/06/2016   Dr. Dayna Barker    SHOULDER ARTHROSCOPY WITH ROTATOR CUFF REPAIR Right 02/24/2019   Procedure: RIGHT SHOULDER ARTHROSCOPY WITH SUBACROMIAL DECOMPRESSION, DISTAL CLAVICLE EXCISION, LYSIS OF ADHESIONS,CAPSULOTOMY,MINI OPEN ROTATOR CUFF REPAIR;  Surgeon: Juanell Fairly, MD;  Location: [facility] ORS;  Service: Orthopedics;  Laterality: Right;    Family History  Problem Relation Age of Onset   Emphysema Mother    Cancer Father 1       lung   Cancer Brother        tongue    Social History   Socioeconomic History   Marital status: Divorced    Spouse name: Not on file   Number of children: 1   Years of education: Not on file   Highest education level: High school graduate  Occupational History   Not on file  Tobacco Use   Smoking status: Never   Smokeless tobacco: Never  Vaping Use   Vaping Use: Never used  Substance and Sexual Activity   Alcohol use: No    Alcohol/week: 0.0 standard drinks of alcohol   Drug use: No   Sexual activity: Yes    Partners: Male  Other Topics Concern   Not on file  Social History Narrative   Not on file   Social Determinants of Health   Financial Resource  Strain: Low Risk  (11/19/2022)   Overall Financial Resource Strain (CARDIA)    Difficulty of Paying Living Expenses: Not hard at all  Food Insecurity: No Food Insecurity (11/19/2022)   Hunger Vital Sign    Worried About Running Out of Food in the Last Year: Never true    Ran Out of Food in the Last Year: Never true  Transportation Needs: No Transportation Needs (11/19/2022)   PRAPARE - Administrator, Civil Service (Medical): No    Lack of Transportation (Non-Medical): No  Physical Activity: Insufficiently Active (11/19/2022)   Exercise Vital Sign    Days of Exercise per Week: 1 day    Minutes of Exercise per Session: 20 min  Stress: No Stress Concern Present (11/19/2022)   [employer] of Occupational Health - Occupational Stress Questionnaire    Feeling of Stress : Not at all  Social Connections: Socially Isolated (11/19/2022)   Social Connection and Isolation Panel [NHANES]    Frequency of Communication with Friends and Family: More than three times a week    Frequency of Social Gatherings with Friends and Family: More than three times a week    Attends Religious Services: Never    Database administrator or Organizations: No    Attends Banker Meetings: Never    Marital Status: Divorced  Catering manager Violence: Not At Risk (11/19/2022)   Humiliation, Afraid, Rape, and Kick questionnaire    Fear of Current or Ex-Partner: No    Emotionally Abused: No    Physically Abused: No    Sexually Abused: No     Current Outpatient Medications:    Adalimumab (HUMIRA, 2 SYRINGE,) 40 MG/0.4ML PSKT, Inject 1 each into the skin every 14 (fourteen) days., Disp: , Rfl:    cloNIDine (CATAPRES) 0.1 MG tablet, Take 1 tablet (0.1 mg total) by mouth 2 (two) times daily., Disp: 180 tablet, Rfl: 0   cyclobenzaprine (FLEXERIL) 10 MG tablet, Take 10 mg by mouth at bedtime., Disp: , Rfl:  DULoxetine (CYMBALTA) 60 MG capsule, TAKE 1 CAPSULE(60 MG) BY MOUTH DAILY, Disp: 90  capsule, Rfl: 0   QUEtiapine (SEROQUEL) 25 MG tablet, Take 1 tablet (25 mg total) by mouth at bedtime., Disp: 90 tablet, Rfl: 1   rosuvastatin (CRESTOR) 20 MG tablet, Take 1 tablet (20 mg total) by mouth daily., Disp: 90 tablet, Rfl: 0  Allergies  Allergen Reactions   Codeine Itching   Methotrexate Nausea Only     ROS  Constitutional: Negative for fever or weight change.  Respiratory: Negative for cough and shortness of breath.   Cardiovascular: Negative for chest pain or palpitations.  Gastrointestinal: Negative for abdominal pain, no bowel changes.  Musculoskeletal: Negative for gait problem , positive joint swelling.  Skin: Negative for rash.  Neurological: Negative for dizziness or headache.  No other specific complaints in a complete review of systems (except as listed in HPI above).   Objective  Vitals:   11/19/22 1313 11/19/22 1324  BP: (!) 162/98 (!) 164/96  Pulse: (!) 112   Resp: 16   SpO2: 99%   Weight: 145 lb (65.8 kg)   Height: 5\' 3"  (1.6 m)     Body mass index is 25.69 kg/m.  Physical Exam  Constitutional: Patient appears well-developed and well-nourished. No distress.  HENT: Head: Normocephalic and atraumatic. Ears: B TMs ok, no erythema or effusion; Nose: Nose normal. Mouth/Throat: Oropharynx is clear and moist. No oropharyngeal exudate.  Eyes: Conjunctivae and EOM are normal. Pupils are equal, round, and reactive to light. No scleral icterus.  Neck: Normal range of motion. Neck supple. No JVD present. No thyromegaly present.  Cardiovascular: Normal rate, regular rhythm and normal heart sounds.  No murmur heard. No BLE edema. Pulmonary/Chest: Effort normal and breath sounds normal. No respiratory distress. Abdominal: Soft. Bowel sounds are normal, no distension. There is no tenderness. no masses Breast: no lumps or masses, no nipple discharge or rashes FEMALE GENITALIA:  External genitalia showed some lichenification  External urethra normal Vaginal  vault normal atrophic Cervix unable to see it, patient was in a lot of pain and had to remove speculum  Bimanual exam normal without masses RECTAL: not done  Musculoskeletal: Normal range of motion, no joint effusions. No gross deformities Neurological: he is alert and oriented to person, place, and time. No cranial nerve deficit. Coordination, balance, strength, speech and gait are normal.  Skin: Skin is warm and dry. No rash noted. No erythema.  Psychiatric: Patient has a normal mood and affect. behavior is normal. Judgment and thought content normal.   Fall Risk:    11/19/2022    1:12 PM 08/20/2022    2:55 PM 02/17/2022   10:13 AM 10/21/2021    2:53 PM 05/21/2021    2:18 PM  Fall Risk   Falls in the past year? 0 0 1 0 1  Number falls in past yr: 0 0 1 0 0  Injury with Fall? 0 0 0 0 1  Risk for fall due to : No Fall Risks No Fall Risks No Fall Risks No Fall Risks No Fall Risks  Follow up Falls prevention discussed Falls prevention discussed Falls prevention discussed Falls prevention discussed Falls prevention discussed     Functional Status Survey: Is the patient deaf or have difficulty hearing?: No Does the patient have difficulty seeing, even when wearing glasses/contacts?: No Does the patient have difficulty concentrating, remembering, or making decisions?: No Does the patient have difficulty walking or climbing stairs?: Yes Does the patient have difficulty dressing  or bathing?: Yes Does the patient have difficulty doing errands alone such as visiting a doctor's office or shopping?: No   Assessment & Plan  1. Well adult exam  - TSH - COMPLETE METABOLIC PANEL WITH GFR - CBC with Differential/Platelet  2. Cervical cancer screening  - Cytology - PAP ( blind collection )   3. Need for Tdap vaccination  - Tdap vaccine greater than or equal to 7yo IM  4. Elevated blood pressure reading  - TSH - COMPLETE METABOLIC PANEL WITH GFR  5. Tachycardia  - COMPLETE  METABOLIC PANEL WITH GFR - CBC with Differential/Platelet    -USPSTF grade A and B recommendations reviewed with patient; age-appropriate recommendations, preventive care, screening tests, etc discussed and encouraged; healthy living encouraged; see AVS for patient education given to patient -Discussed importance of 150 minutes of physical activity weekly, eat two servings of fish weekly, eat one serving of tree nuts ( cashews, pistachios, pecans, almonds.Marland Kitchen) every other day, eat 6 servings of fruit/vegetables daily and drink plenty of water and avoid sweet beverages.   -Reviewed Health Maintenance: Yes.

## 2022-11-19 ENCOUNTER — Ambulatory Visit (INDEPENDENT_AMBULATORY_CARE_PROVIDER_SITE_OTHER): Payer: Managed Care, Other (non HMO) | Admitting: Family Medicine

## 2022-11-19 ENCOUNTER — Encounter: Payer: Self-pay | Admitting: Family Medicine

## 2022-11-19 VITALS — BP 164/96 | HR 112 | Resp 16 | Ht 63.0 in | Wt 145.0 lb

## 2022-11-19 DIAGNOSIS — Z124 Encounter for screening for malignant neoplasm of cervix: Secondary | ICD-10-CM

## 2022-11-19 DIAGNOSIS — R000 Tachycardia, unspecified: Secondary | ICD-10-CM

## 2022-11-19 DIAGNOSIS — R030 Elevated blood-pressure reading, without diagnosis of hypertension: Secondary | ICD-10-CM

## 2022-11-19 DIAGNOSIS — Z23 Encounter for immunization: Secondary | ICD-10-CM | POA: Diagnosis not present

## 2022-11-19 DIAGNOSIS — Z0000 Encounter for general adult medical examination without abnormal findings: Secondary | ICD-10-CM | POA: Diagnosis not present

## 2022-11-20 LAB — CBC WITH DIFFERENTIAL/PLATELET
Basophils Absolute: 0.1 10*3/uL (ref 0.0–0.2)
Basos: 1 %
EOS (ABSOLUTE): 0.5 10*3/uL — ABNORMAL HIGH (ref 0.0–0.4)
Eos: 6 %
Hematocrit: 42.1 % (ref 34.0–46.6)
Hemoglobin: 14.2 g/dL (ref 11.1–15.9)
Immature Grans (Abs): 0 10*3/uL (ref 0.0–0.1)
Immature Granulocytes: 0 %
Lymphocytes Absolute: 1.5 10*3/uL (ref 0.7–3.1)
Lymphs: 17 %
MCH: 29.5 pg (ref 26.6–33.0)
MCHC: 33.7 g/dL (ref 31.5–35.7)
MCV: 88 fL (ref 79–97)
Monocytes Absolute: 0.4 10*3/uL (ref 0.1–0.9)
Monocytes: 5 %
Neutrophils Absolute: 6.1 10*3/uL (ref 1.4–7.0)
Neutrophils: 71 %
Platelets: 316 10*3/uL (ref 150–450)
RBC: 4.81 x10E6/uL (ref 3.77–5.28)
RDW: 12.8 % (ref 11.7–15.4)
WBC: 8.6 10*3/uL (ref 3.4–10.8)

## 2022-11-20 LAB — COMPREHENSIVE METABOLIC PANEL
ALT: 13 IU/L (ref 0–32)
AST: 18 IU/L (ref 0–40)
Albumin/Globulin Ratio: 2.2 (ref 1.2–2.2)
Albumin: 4.4 g/dL (ref 3.9–4.9)
Alkaline Phosphatase: 132 IU/L — ABNORMAL HIGH (ref 44–121)
BUN/Creatinine Ratio: 14 (ref 12–28)
BUN: 12 mg/dL (ref 8–27)
Bilirubin Total: 0.3 mg/dL (ref 0.0–1.2)
CO2: 23 mmol/L (ref 20–29)
Calcium: 9.6 mg/dL (ref 8.7–10.3)
Chloride: 103 mmol/L (ref 96–106)
Creatinine, Ser: 0.87 mg/dL (ref 0.57–1.00)
Globulin, Total: 2 g/dL (ref 1.5–4.5)
Glucose: 89 mg/dL (ref 70–99)
Potassium: 3.8 mmol/L (ref 3.5–5.2)
Sodium: 141 mmol/L (ref 134–144)
Total Protein: 6.4 g/dL (ref 6.0–8.5)
eGFR: 76 mL/min/{1.73_m2} (ref >59)

## 2022-11-20 LAB — TSH: TSH: 1.74 u[IU]/mL (ref 0.450–4.500)

## 2022-11-25 LAB — PAP LB (LIQUID-BASED)

## 2022-11-25 LAB — SPECIMEN STATUS REPORT

## 2023-01-13 ENCOUNTER — Other Ambulatory Visit: Payer: Self-pay | Admitting: Family Medicine

## 2023-01-13 DIAGNOSIS — F330 Major depressive disorder, recurrent, mild: Secondary | ICD-10-CM

## 2023-01-13 NOTE — Telephone Encounter (Signed)
Medication Refill - Medication: DULoxetine (CYMBALTA) 60 MG capsule [[id number]]   Has the patient contacted their pharmacy? Yes.     (Agent: If yes, when and what did the pharmacy advise?) Contact PCP   Preferred Pharmacy (with phone number or street name): [facility] [address]   Has the patient been seen for an appointment in the last year OR does the patient have an upcoming appointment? Yes.    Agent: Please be advised that RX refills may take up to 3 business days. We ask that you follow-up with your pharmacy.

## 2023-01-14 MED ORDER — DULOXETINE HCL 60 MG PO CPEP
ORAL_CAPSULE | ORAL | 0 refills | Status: DC
Start: 2023-01-14 — End: 2023-02-18

## 2023-01-14 NOTE — Telephone Encounter (Signed)
Requested Prescriptions  Pending Prescriptions Disp Refills   DULoxetine (CYMBALTA) 60 MG capsule 90 capsule 0    Sig: TAKE 1 CAPSULE(60 MG) BY MOUTH DAILY     Psychiatry: Antidepressants - SNRI - duloxetine Failed - 01/13/2023  3:53 PM      Failed - Last BP in normal range    BP Readings from Last 1 Encounters:  11/19/22 (!) 164/96         Passed - Cr in normal range and within 360 days    Creatinine, Ser  Date Value Ref Range Status  11/19/2022 0.87 0.57 - 1.00 mg/dL Final         Passed - eGFR is 30 or above and within 360 days    GFR calc Af Amer  Date Value Ref Range Status  03/09/2020 >60 >60 mL/min Final   GFR calc non Af Amer  Date Value Ref Range Status  03/09/2020 >60 >60 mL/min Final   eGFR  Date Value Ref Range Status  11/19/2022 76 >59 mL/min/1.73 Final         Passed - Completed PHQ-2 or PHQ-9 in the last 360 days      Passed - Valid encounter within last 6 months    Recent Outpatient Visits           1 month ago Well adult exam   [facility] Alba Cory, MD   4 months ago Seronegative rheumatoid arthritis [facility])   [place] [facility] Alba Cory, MD   11 months ago Seronegative rheumatoid arthritis [facility])   [facility] Alba Cory, MD   1 year ago Mild episode of recurrent major depressive disorder [facility])    [facility] Alba Cory, MD   1 year ago Depression, major, in remission [facility])   [facility] Alba Cory, MD       Future Appointments             In 1 month Carlynn Purl, Danna Hefty, MD [facility], [facility]

## 2023-02-05 LAB — COMPREHENSIVE METABOLIC PANEL: eGFR: 64

## 2023-02-05 LAB — LIPID PANEL
Cholesterol: 235 — AB (ref 0–200)
HDL: 62 (ref 35–70)
LDL Cholesterol: 156
Triglycerides: 96 (ref 40–160)

## 2023-02-05 LAB — BASIC METABOLIC PANEL
Creatinine: 1 (ref 0.5–1.1)
Glucose: 100

## 2023-02-05 LAB — HEMOGLOBIN A1C: Hemoglobin A1C: 5.7

## 2023-02-18 ENCOUNTER — Encounter: Payer: Self-pay | Admitting: Family Medicine

## 2023-02-18 ENCOUNTER — Ambulatory Visit: Payer: Managed Care, Other (non HMO) | Admitting: Family Medicine

## 2023-02-18 VITALS — BP 138/76 | HR 91 | Resp 16 | Ht 62.0 in | Wt 146.0 lb

## 2023-02-18 DIAGNOSIS — M545 Low back pain, unspecified: Secondary | ICD-10-CM

## 2023-02-18 DIAGNOSIS — N951 Menopausal and female climacteric states: Secondary | ICD-10-CM

## 2023-02-18 DIAGNOSIS — G894 Chronic pain syndrome: Secondary | ICD-10-CM | POA: Diagnosis not present

## 2023-02-18 DIAGNOSIS — E7800 Pure hypercholesterolemia, unspecified: Secondary | ICD-10-CM

## 2023-02-18 DIAGNOSIS — M0600 Rheumatoid arthritis without rheumatoid factor, unspecified site: Secondary | ICD-10-CM | POA: Diagnosis not present

## 2023-02-18 DIAGNOSIS — I7300 Raynaud's syndrome without gangrene: Secondary | ICD-10-CM

## 2023-02-18 DIAGNOSIS — F331 Major depressive disorder, recurrent, moderate: Secondary | ICD-10-CM

## 2023-02-18 DIAGNOSIS — Z79899 Other long term (current) drug therapy: Secondary | ICD-10-CM

## 2023-02-18 DIAGNOSIS — E559 Vitamin D deficiency, unspecified: Secondary | ICD-10-CM

## 2023-02-18 DIAGNOSIS — E538 Deficiency of other specified B group vitamins: Secondary | ICD-10-CM

## 2023-02-18 MED ORDER — ROSUVASTATIN CALCIUM 20 MG PO TABS
20.0000 mg | ORAL_TABLET | Freq: Every day | ORAL | 1 refills | Status: DC
Start: 2023-02-18 — End: 2023-08-26

## 2023-02-18 MED ORDER — QUETIAPINE FUMARATE 25 MG PO TABS
25.0000 mg | ORAL_TABLET | Freq: Every day | ORAL | 1 refills | Status: DC
Start: 2023-02-18 — End: 2023-08-26

## 2023-02-18 MED ORDER — DULOXETINE HCL 60 MG PO CPEP
ORAL_CAPSULE | ORAL | 1 refills | Status: DC
Start: 2023-02-18 — End: 2023-04-23

## 2023-02-18 MED ORDER — CLONIDINE HCL 0.1 MG PO TABS
0.1000 mg | ORAL_TABLET | Freq: Two times a day (BID) | ORAL | 1 refills | Status: DC
Start: 2023-02-18 — End: 2023-08-26

## 2023-02-18 MED ORDER — TRAMADOL HCL 50 MG PO TABS
50.0000 mg | ORAL_TABLET | Freq: Every day | ORAL | 0 refills | Status: DC
Start: 2023-02-18 — End: 2023-05-25

## 2023-02-18 MED ORDER — BUPROPION HCL ER (XL) 150 MG PO TB24
150.0000 mg | ORAL_TABLET | Freq: Every day | ORAL | 0 refills | Status: DC
Start: 2023-02-18 — End: 2023-05-25

## 2023-02-18 NOTE — Progress Notes (Unsigned)
Name: Candice Guzman   MRN: [id number]    DOB: January 03, 1961   Date:02/19/2023       Progress Note  Subjective  Chief Complaint  Follow Up  HPI  Seronegative Rheumatoid Arthritis: She could not tolerate methotrexate due to nausea and hair loss, she has been off Humira due to cost. She has an upcoming visit with Rheumatologist. She states in the morning the pain is so intense and she feels so stiff that she needs to use her back brace before getting out of bed.  Pain has been worse on hands, wrist and back, unable to make a fist in am's, very stiff. Muscle relaxer does not help. .She has been taking otc ibuprofen   History of kidney stone: it  was a calcium stone and discussed starting on diuretics, but no recent episodes and we will continue to monitor    Insomnia: she has been on Lunesta for a while now since Ambien no longer on formulary, she states Lunesta no longer working, sleeping at most 2 hours per night. She took Temazepam, she is now on Seroquel but states only able to sleep for about 2-3 hours and after that she has to toss and turn due to pain    Major Depression:  She states mood is affected by constant pain, phq 9 is very high. She is tired of being in pain all the time, lack of motivation , feels tired all the time. She is taking Duloxetine daily and also seroquel for sleep .   Menopause: she is now on Clonidine BID  she is doing much better and no longer needs Drisol for axilla    Back pain: she states she has a constant lower back tightness ,she saw Dr. Joanna Puff for back injections for DDD but insurance denied any more coverage. She used to take Tramadol for pain but has been out of medication. She has not been to Dr. Council Mechanic lately , last fill was many years ago   Dyslipidemia: LDL is still up, she had labs done at work recently, she had just resumed taking Crestor about two weeks prior, discussed importance to take it daily   Patient Active Problem List   Diagnosis Date  Noted   Osteopenia after menopause 10/08/2022   Raynaud's disease without gangrene 08/20/2022   Mild episode of recurrent major depressive disorder (HCC) 02/17/2022   Seronegative rheumatoid arthritis (HCC) 02/17/2022   Pure hypercholesterolemia 02/17/2022   Vitamin B12 deficiency 02/17/2022   Anxiety 02/17/2022   Bilateral bunions 02/17/2022   Pain in joint of right shoulder 01/18/2019   Stiffness of right shoulder joint 01/18/2019   Osteoarthritis involving multiple joints 12/07/2018   Moderate episode of recurrent major depressive disorder (HCC) 08/27/2018   Foot drop (Right) 08/04/2018   Abnormal MRI, lumbar spine (06/21/2018) 08/04/2018   Chronic lumbar radicular pain (L5 dermatome) (Bilateral) (R>L) 05/26/2018   Chronic lower extremity pain (1ry area of Pain) (Bilateral) (R>L) 09/21/2017   Chronic low back pain (2ry area of Pain) (Bilateral) (R>L) 09/21/2017   Spondylosis without myelopathy or radiculopathy, lumbosacral region 09/21/2017   Chronic hip pain (3ry area of Pain) (Bilateral) (R>L) 09/21/2017   Chronic shoulder pain (Bilateral) (L>R) 09/21/2017   Grade 1 Anterolisthesis of L5 over S1 & L3 over L4 09/21/2017   Grade 1 Retrolisthesis L1 over L2 & L2 over L3 09/21/2017   DDD (degenerative disc disease), lumbar 09/21/2017   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) 09/21/2017   Lumbar facet joint syndrome (  Bilateral) (R>L) 09/21/2017   Chronic musculoskeletal pain 09/21/2017   Neurogenic pain 09/21/2017   Lumbar foraminal stenosis (L5-S1) (Bilateral) 09/21/2017   Lumbar lateral recess stenosis (Bilateral) (B: L4-5) (L>R: L3-4) 09/21/2017   Lumbar facet hypertrophy (Bilateral: L3-4, L4-5, and L5-S1) 09/21/2017   Chronic pain syndrome 07/08/2017   Chronic knee pain (4th area of Pain) (Bilateral) 07/08/2017   Chronic wrist pain (Fifth Area of Pain) (Bilateral) 07/08/2017   Disorder of skeletal system 07/08/2017   Other long term (current) drug therapy  07/08/2017   Chronic pain of multiple joints 05/20/2017   Insomnia, persistent 12/18/2014   Depression, major, single episode, moderate (HCC) 12/18/2014   Menopausal symptom 12/18/2014   Vitamin D deficiency 12/18/2014   Lumbosacral radiculopathy (L5) (Right) 12/12/2013   Bulge of lumbar disc without myelopathy 06/18/2011    Past Surgical History:  Procedure Laterality Date   BREAST BIOPSY Left 2010   neg   CESAREAN SECTION     FINGER ARTHROPLASTY Left 07/24/2017   Thumb   GANGLION CYST EXCISION     METACARPOPHALANGEAL JOINT ARTHROPLASTY Right 06/06/2016   Dr. Dayna Barker    SHOULDER ARTHROSCOPY WITH ROTATOR CUFF REPAIR Right 02/24/2019   Procedure: RIGHT SHOULDER ARTHROSCOPY WITH SUBACROMIAL DECOMPRESSION, DISTAL CLAVICLE EXCISION, LYSIS OF ADHESIONS,CAPSULOTOMY,MINI OPEN ROTATOR CUFF REPAIR;  Surgeon: Juanell Fairly, MD;  Location: [facility];  Service: Orthopedics;  Laterality: Right;    Family History  Problem Relation Age of Onset   Emphysema Mother    Cancer Father 66       lung   Cancer Brother        tongue    Social History   Tobacco Use   Smoking status: Never   Smokeless tobacco: Never  Substance Use Topics   Alcohol use: No    Alcohol/week: 0.0 standard drinks of alcohol     Current Outpatient Medications:    buPROPion (WELLBUTRIN XL) 150 MG 24 hr tablet, Take 1 tablet (150 mg total) by mouth daily with breakfast., Disp: 90 tablet, Rfl: 0   cyclobenzaprine (FLEXERIL) 10 MG tablet, Take 10 mg by mouth at bedtime., Disp: , Rfl:    traMADol (ULTRAM) 50 MG tablet, Take 1 tablet (50 mg total) by mouth at bedtime., Disp: 90 tablet, Rfl: 0   cloNIDine (CATAPRES) 0.1 MG tablet, Take 1 tablet (0.1 mg total) by mouth 2 (two) times daily., Disp: 180 tablet, Rfl: 1   DULoxetine (CYMBALTA) 60 MG capsule, TAKE 1 CAPSULE(60 MG) BY MOUTH DAILY, Disp: 90 capsule, Rfl: 1   QUEtiapine (SEROQUEL) 25 MG tablet, Take 1 tablet (25 mg total) by mouth at bedtime., Disp: 90 tablet,  Rfl: 1   rosuvastatin (CRESTOR) 20 MG tablet, Take 1 tablet (20 mg total) by mouth daily., Disp: 90 tablet, Rfl: 1  Allergies  Allergen Reactions   Codeine Itching   Methotrexate Nausea Only    I personally reviewed active problem list, medication list, allergies, family history, social history, health maintenance with the patient/caregiver today.   ROS  Constitutional: Negative for fever or weight change.  Respiratory: Negative for cough and shortness of breath.   Cardiovascular: Negative for chest pain or palpitations.  Gastrointestinal: Negative for abdominal pain, no bowel changes.  Musculoskeletal: Negative for gait problem , positive for joint swelling.  Skin: Negative for rash.  Neurological: Negative for dizziness or headache.  No other specific complaints in a complete review of systems (except as listed in HPI above).   Objective  Vitals:   02/18/23 1454  BP: 138/76  Pulse: 91  Resp: 16  SpO2: 99%  Weight: 146 lb (66.2 kg)  Height: 5\' 2"  (1.575 m)    Body mass index is 26.7 kg/m.  Physical Exam  Constitutional: Patient appears well-developed and well-nourished.  No distress.  HEENT: head atraumatic, normocephalic, pupils equal and reactive to light,neck supple Cardiovascular: Normal rate, regular rhythm and normal heart sounds.  No murmur heard. No BLE edema. Pulmonary/Chest: Effort normal and breath sounds normal. No respiratory distress. Abdominal: Soft.  There is no tenderness. Muscular skeletal: synovitis hands, normal rom of spine Psychiatric: Patient has a normal mood and affect. behavior is normal. Judgment and thought content normal.    PHQ2/9:    02/18/2023    2:54 PM 11/19/2022    1:12 PM 08/20/2022    2:55 PM 02/17/2022   10:14 AM 10/21/2021    2:53 PM  Depression screen PHQ 2/9  Decreased Interest 2 1 1 2 3   Down, Depressed, Hopeless 3 1 1 2 3   PHQ - 2 Score 5 2 2 4 6   Altered sleeping 3 0 3 3 3   Tired, decreased energy 3 0 3 0 3  Change  in appetite 0 0 0 0 0  Feeling bad or failure about yourself  0 0 0 0 0  Trouble concentrating 0 0 0 0 0  Moving slowly or fidgety/restless 0 0 0 0 0  Suicidal thoughts 0 0 0 0 0  PHQ-9 Score 11 2 8 7 12     phq 9 is positive   Fall Risk:    02/18/2023    2:54 PM 11/19/2022    1:12 PM 08/20/2022    2:55 PM 02/17/2022   10:13 AM 10/21/2021    2:53 PM  Fall Risk   Falls in the past year? 0 0 0 1 0  Number falls in past yr: 0 0 0 1 0  Injury with Fall? 0 0 0 0 0  Risk for fall due to : No Fall Risks No Fall Risks No Fall Risks No Fall Risks No Fall Risks  Follow up Falls prevention discussed Falls prevention discussed Falls prevention discussed Falls prevention discussed Falls prevention discussed      Functional Status Survey: Is the patient deaf or have difficulty hearing?: No Does the patient have difficulty seeing, even when wearing glasses/contacts?: No Does the patient have difficulty concentrating, remembering, or making decisions?: No Does the patient have difficulty walking or climbing stairs?: Yes Does the patient have difficulty dressing or bathing?: Yes Does the patient have difficulty doing errands alone such as visiting a doctor's office or shopping?: No    Assessment & Plan  1. Seronegative rheumatoid arthritis (HCC)  - traMADol (ULTRAM) 50 MG tablet; Take 1 tablet (50 mg total) by mouth at bedtime.  Dispense: 90 tablet; Refill: 0  2. Depression, major, recurrent, moderate (HCC)  - DULoxetine (CYMBALTA) 60 MG capsule; TAKE 1 CAPSULE(60 MG) BY MOUTH DAILY  Dispense: 90 capsule; Refill: 1 - QUEtiapine (SEROQUEL) 25 MG tablet; Take 1 tablet (25 mg total) by mouth at bedtime.  Dispense: 90 tablet; Refill: 1 - buPROPion (WELLBUTRIN XL) 150 MG 24 hr tablet; Take 1 tablet (150 mg total) by mouth daily with breakfast.  Dispense: 90 tablet; Refill: 0   3. Chronic pain syndrome  - traMADol (ULTRAM) 50 MG tablet; Take 1 tablet (50 mg total) by mouth at bedtime.   Dispense: 90 tablet; Refill: 0  4. Chronic low back pain without sciatica, unspecified back pain laterality  -  traMADol (ULTRAM) 50 MG tablet; Take 1 tablet (50 mg total) by mouth at bedtime.  Dispense: 90 tablet; Refill: 0  5. Vitamin D deficiency  - VITAMIN D 25 Hydroxy (Vit-D Deficiency, Fractures)  6. Menopausal symptom  - cloNIDine (CATAPRES) 0.1 MG tablet; Take 1 tablet (0.1 mg total) by mouth 2 (two) times daily.  Dispense: 180 tablet; Refill: 1  7. Vitamin B12 deficiency  - B12 and Folate Panel  8. Pure hypercholesterolemia  - rosuvastatin (CRESTOR) 20 MG tablet; Take 1 tablet (20 mg total) by mouth daily.  Dispense: 90 tablet; Refill: 1 - Lipid panel  9. Long-term use of high-risk medication  - Comprehensive metabolic panel

## 2023-03-11 LAB — COMPREHENSIVE METABOLIC PANEL
ALT: 15 IU/L (ref 0–32)
AST: 21 IU/L (ref 0–40)
Albumin: 4.1 g/dL (ref 3.9–4.9)
Alkaline Phosphatase: 158 IU/L — ABNORMAL HIGH (ref 44–121)
BUN/Creatinine Ratio: 13 (ref 12–28)
BUN: 13 mg/dL (ref 8–27)
Bilirubin Total: 0.3 mg/dL (ref 0.0–1.2)
CO2: 25 mmol/L (ref 20–29)
Calcium: 9.6 mg/dL (ref 8.7–10.3)
Chloride: 103 mmol/L (ref 96–106)
Creatinine, Ser: 1.04 mg/dL — ABNORMAL HIGH (ref 0.57–1.00)
Globulin, Total: 2.4 g/dL (ref 1.5–4.5)
Glucose: 109 mg/dL — ABNORMAL HIGH (ref 70–99)
Potassium: 4.1 mmol/L (ref 3.5–5.2)
Sodium: 141 mmol/L (ref 134–144)
Total Protein: 6.5 g/dL (ref 6.0–8.5)
eGFR: 61 mL/min/{1.73_m2} (ref >59)

## 2023-03-11 LAB — VITAMIN D 25 HYDROXY (VIT D DEFICIENCY, FRACTURES): Vit D, 25-Hydroxy: 28.6 ng/mL — ABNORMAL LOW (ref 30.0–100.0)

## 2023-03-11 LAB — LIPID PANEL
Chol/HDL Ratio: 3.8 ratio (ref 0.0–4.4)
Cholesterol, Total: 216 mg/dL — ABNORMAL HIGH (ref 100–199)
HDL: 57 mg/dL (ref >39)
LDL Chol Calc (NIH): 141 mg/dL — ABNORMAL HIGH (ref 0–99)
Triglycerides: 103 mg/dL (ref 0–149)
VLDL Cholesterol Cal: 18 mg/dL (ref 5–40)

## 2023-03-11 LAB — B12 AND FOLATE PANEL
Folate: 9.7 ng/mL (ref >3.0)
Vitamin B-12: 380 pg/mL (ref 232–1245)

## 2023-04-07 ENCOUNTER — Other Ambulatory Visit: Payer: Self-pay | Admitting: Family Medicine

## 2023-04-07 DIAGNOSIS — M4807 Spinal stenosis, lumbosacral region: Secondary | ICD-10-CM

## 2023-04-22 ENCOUNTER — Ambulatory Visit
Admission: RE | Admit: 2023-04-22 | Discharge: 2023-04-22 | Disposition: A | Discharge status: Home / Self Care | Payer: Managed Care, Other (non HMO) | Source: Ambulatory Visit | Attending: Family Medicine | Admitting: Family Medicine

## 2023-04-22 DIAGNOSIS — M4807 Spinal stenosis, lumbosacral region: Secondary | ICD-10-CM

## 2023-04-23 ENCOUNTER — Other Ambulatory Visit: Payer: Self-pay | Admitting: Family Medicine

## 2023-04-23 DIAGNOSIS — F331 Major depressive disorder, recurrent, moderate: Secondary | ICD-10-CM

## 2023-04-23 MED ORDER — DULOXETINE HCL 60 MG PO CPEP: ORAL_CAPSULE | ORAL | 1 refills | Status: DC

## 2023-04-23 NOTE — Telephone Encounter (Signed)
Requested Prescriptions  Pending Prescriptions Disp Refills   DULoxetine (CYMBALTA) 60 MG capsule 90 capsule 1    Sig: TAKE 1 CAPSULE(60 MG) BY MOUTH DAILY     Psychiatry: Antidepressants - SNRI - duloxetine Failed - 04/23/2023  9:46 AM      Failed - Cr in normal range and within 360 days    Creatinine, Ser  Date Value Ref Range Status  03/10/2023 1.04 (H) 0.57 - 1.00 mg/dL Final         Passed - eGFR is 30 or above and within 360 days    GFR calc Af Amer  Date Value Ref Range Status  03/09/2020 >60 >60 mL/min Final   GFR calc non Af Amer  Date Value Ref Range Status  03/09/2020 >60 >60 mL/min Final   eGFR  Date Value Ref Range Status  03/10/2023 61 >59 mL/min/1.73 Final         Passed - Completed PHQ-2 or PHQ-9 in the last 360 days      Passed - Last BP in normal range    BP Readings from Last 1 Encounters:  02/18/23 138/76         Passed - Valid encounter within last 6 months    Recent Outpatient Visits           2 months ago Seronegative rheumatoid arthritis [facility])   [place] [facility] Alba Cory, MD   5 months ago Well adult exam   [facility] Alba Cory, MD   8 months ago Seronegative rheumatoid arthritis [facility])   [place] [facility] Alba Cory, MD   1 year ago Seronegative rheumatoid arthritis [facility])   [place] [facility] Alba Cory, MD   1 year ago Mild episode of recurrent major depressive disorder [facility])   [facility] Alba Cory, MD       Future Appointments             In 1 month Carlynn Purl, Danna Hefty, MD [facility], [facility]

## 2023-04-23 NOTE — Telephone Encounter (Signed)
Medication Refill - Medication: DULoxetine (CYMBALTA) 60 MG capsule   Has the patient contacted their pharmacy? Yes.    (Agent: If yes, when and what did the pharmacy advise?)  Preferred Pharmacy (with phone number or street name):  [id number] - [place], [place] - [address] AT [facility]  [address] [place] [address]  Phone: [phone] Fax: [phone]  Hours: Not open 24 hours   Has the patient been seen for an appointment in the last year OR does the patient have an upcoming appointment? Yes.    Agent: Please be advised that RX refills may take up to 3 business days. We ask that you follow-up with your pharmacy.

## 2023-05-22 NOTE — Progress Notes (Unsigned)
Name: Candice Guzman   MRN: [id number]    DOB: May 06, 1961   Date:05/25/2023       Progress Note  Subjective  Chief Complaint  Follow Up  HPI  Seronegative Rheumatoid Arthritis: She could not tolerate methotrexate due to nausea and hair loss, she has been off Humira due to cost.  She states in the morning the pain is so intense and she feels so stiff that she needs to use her back brace before getting out of bed.  Pain has been worse on hands, wrist and back, unable to make a fist in am's, very stiff. Muscle relaxer does not help. She still has not filled rx of Humira due to insurance problems. Advised her to contact Rheumatologist   History of kidney stone: it  was a calcium stone and discussed starting on diuretics, but no recent episodes and we will continue to monitor  Unchanged   Insomnia: she has been on Lunesta for a while now since Ambien no longer on formulary, she states Lunesta no longer working, sleeping at most 2 hours per night. She took Temazepam, she is now on Seroquel and is able to stay asleep 2.5-3 hours stretches and is able to fall back asleep when she gets up during the night. Sometimes it can take one hour to fall back asleep   Major Depression:  She states mood is affected by constant pain, we added Wellbutrin XL to her regiment in August 2024, she feels it has helped with focus and alertness but Phq 9 is higher - she states pain is worse and is affecting her mood. She would like to try going on higher dose of Wellbutrin to 300 mg and continue Duloxetine. She denies personal history of seizures  Menopause: she is now on Clonidine BID  symptoms are controlled    Back pain: she states she has a constant lower back tightness ,she saw Dr. Joanna Puff for back injections for DDD but insurance denied any more coverage, currently seeing Dr. Yves Dill had a repeat MRI that showed mild progression of disease . I sent a rx for Tramadol 3 months ago but she did not know it was at the  pharmacy. Pain affects her sleep and mood . We will send another fill today   Dyslipidemia: LDL has improved down from 207 to 141, she takes rosuvastatin most days, we will adjust dose to 40 mg on her next visit since she just filled her rx   Patient Active Problem List   Diagnosis Date Noted   Osteopenia after menopause 10/08/2022   Raynaud's disease without gangrene 08/20/2022   Mild episode of recurrent major depressive disorder (HCC) 02/17/2022   Seronegative rheumatoid arthritis (HCC) 02/17/2022   Pure hypercholesterolemia 02/17/2022   Vitamin B12 deficiency 02/17/2022   Anxiety 02/17/2022   Bilateral bunions 02/17/2022   Pain in joint of right shoulder 01/18/2019   Stiffness of right shoulder joint 01/18/2019   Osteoarthritis involving multiple joints 12/07/2018   Moderate episode of recurrent major depressive disorder (HCC) 08/27/2018   Foot drop (Right) 08/04/2018   Abnormal MRI, lumbar spine (06/21/2018) 08/04/2018   Chronic lumbar radicular pain (L5 dermatome) (Bilateral) (R>L) 05/26/2018   Chronic lower extremity pain (1ry area of Pain) (Bilateral) (R>L) 09/21/2017   Chronic low back pain (2ry area of Pain) (Bilateral) (R>L) 09/21/2017   Spondylosis without myelopathy or radiculopathy, lumbosacral region 09/21/2017   Chronic hip pain (3ry area of Pain) (Bilateral) (R>L) 09/21/2017   Chronic shoulder pain (Bilateral) (L>R) 09/21/2017  Grade 1 Anterolisthesis of L5 over S1 & L3 over L4 09/21/2017   Grade 1 Retrolisthesis L1 over L2 & L2 over L3 09/21/2017   DDD (degenerative disc disease), lumbar 09/21/2017   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) 09/21/2017   Lumbar facet joint syndrome (Bilateral) (R>L) 09/21/2017   Chronic musculoskeletal pain 09/21/2017   Neurogenic pain 09/21/2017   Lumbar foraminal stenosis (L5-S1) (Bilateral) 09/21/2017   Lumbar lateral recess stenosis (Bilateral) (B: L4-5) (L>R: L3-4) 09/21/2017   Lumbar facet hypertrophy  (Bilateral: L3-4, L4-5, and L5-S1) 09/21/2017   Chronic pain syndrome 07/08/2017   Chronic knee pain (4th area of Pain) (Bilateral) 07/08/2017   Chronic wrist pain (Fifth Area of Pain) (Bilateral) 07/08/2017   Disorder of skeletal system 07/08/2017   Other long term (current) drug therapy 07/08/2017   Chronic pain of multiple joints 05/20/2017   Insomnia, persistent 12/18/2014   Depression, major, single episode, moderate (HCC) 12/18/2014   Menopausal symptom 12/18/2014   Vitamin D deficiency 12/18/2014   Lumbosacral radiculopathy (L5) (Right) 12/12/2013   Bulge of lumbar disc without myelopathy 06/18/2011    Past Surgical History:  Procedure Laterality Date   BREAST BIOPSY Left 2010   neg   CESAREAN SECTION     FINGER ARTHROPLASTY Left 07/24/2017   Thumb   GANGLION CYST EXCISION     METACARPOPHALANGEAL JOINT ARTHROPLASTY Right 06/06/2016   Dr. Dayna Barker    SHOULDER ARTHROSCOPY WITH ROTATOR CUFF REPAIR Right 02/24/2019   Procedure: RIGHT SHOULDER ARTHROSCOPY WITH SUBACROMIAL DECOMPRESSION, DISTAL CLAVICLE EXCISION, LYSIS OF ADHESIONS,CAPSULOTOMY,MINI OPEN ROTATOR CUFF REPAIR;  Surgeon: Juanell Fairly, MD;  Location: [facility];  Service: Orthopedics;  Laterality: Right;    Family History  Problem Relation Age of Onset   Emphysema Mother    Cancer Father 34       lung   Cancer Brother        tongue    Social History   Tobacco Use   Smoking status: Never   Smokeless tobacco: Never  Substance Use Topics   Alcohol use: No    Alcohol/week: 0.0 standard drinks of alcohol     Current Outpatient Medications:    cloNIDine (CATAPRES) 0.1 MG tablet, Take 1 tablet (0.1 mg total) by mouth 2 (two) times daily., Disp: 180 tablet, Rfl: 1   cyclobenzaprine (FLEXERIL) 10 MG tablet, Take 10 mg by mouth at bedtime., Disp: , Rfl:    DULoxetine (CYMBALTA) 60 MG capsule, TAKE 1 CAPSULE(60 MG) BY MOUTH DAILY, Disp: 90 capsule, Rfl: 1   QUEtiapine (SEROQUEL) 25 MG tablet, Take 1 tablet (25  mg total) by mouth at bedtime., Disp: 90 tablet, Rfl: 1   rosuvastatin (CRESTOR) 20 MG tablet, Take 1 tablet (20 mg total) by mouth daily., Disp: 90 tablet, Rfl: 1   buPROPion (WELLBUTRIN XL) 300 MG 24 hr tablet, Take 1 tablet (300 mg total) by mouth daily with breakfast., Disp: 90 tablet, Rfl: 0   traMADol (ULTRAM) 50 MG tablet, Take 1 tablet (50 mg total) by mouth at bedtime., Disp: 90 tablet, Rfl: 0  Allergies  Allergen Reactions   Codeine Itching   Methotrexate Nausea Only    I personally reviewed active problem list, medication list, allergies, family history, social history, health maintenance with the patient/caregiver today.   ROS  Ten systems reviewed and is negative except as mentioned in HPI    Objective  Vitals:   05/25/23 1501  BP: 132/72  Pulse: 92  Resp: 12  Temp: 98 F (36.7 C)  TempSrc: Oral  SpO2: 97%  Weight: 147 lb 8 oz (66.9 kg)  Height: 5\' 3"  (1.6 m)    Body mass index is 26.13 kg/m.  Physical Exam  Constitutional: Patient appears well-developed and well-nourished.  No distress.  HEENT: head atraumatic, normocephalic, pupils equal and reactive to light, neck supple Cardiovascular: Normal rate, regular rhythm and normal heart sounds.  No murmur heard. No BLE edema. Pulmonary/Chest: Effort normal and breath sounds normal. No respiratory distress. Abdominal: Soft.  There is no tenderness. Psychiatric: Patient has a normal mood and affect. behavior is normal. Judgment and thought content normal.    PHQ2/9:    05/25/2023    3:03 PM 02/18/2023    2:54 PM 11/19/2022    1:12 PM 08/20/2022    2:55 PM 02/17/2022   10:14 AM  Depression screen PHQ 2/9  Decreased Interest 2 2 1 1 2   Down, Depressed, Hopeless 2 3 1 1 2   PHQ - 2 Score 4 5 2 2 4   Altered sleeping 3 3 0 3 3  Tired, decreased energy 3 3 0 3 0  Change in appetite 3 0 0 0 0  Feeling bad or failure about yourself  0 0 0 0 0  Trouble concentrating 1 0 0 0 0  Moving slowly or fidgety/restless  1 0 0 0 0  Suicidal thoughts 0 0 0 0 0  PHQ-9 Score 15 11 2 8 7   Difficult doing work/chores Somewhat difficult        phq 9 is positive   Assessment & Plan  1. Seronegative rheumatoid arthritis (HCC)  - traMADol (ULTRAM) 50 MG tablet; Take 1 tablet (50 mg total) by mouth at bedtime.  Dispense: 90 tablet; Refill: 0  2. Need for immunization against influenza  - Flu vaccine trivalent PF, 6mos and older(Flulaval,Afluria,Fluarix,Fluzone)  3. Depression, major, recurrent, moderate (HCC)  - buPROPion (WELLBUTRIN XL) 300 MG 24 hr tablet; Take 1 tablet (300 mg total) by mouth daily with breakfast.  Dispense: 90 tablet; Refill: 0  4. Chronic pain syndrome  - traMADol (ULTRAM) 50 MG tablet; Take 1 tablet (50 mg total) by mouth at bedtime.  Dispense: 90 tablet; Refill: 0  5. Chronic low back pain without sciatica, unspecified back pain laterality  - traMADol (ULTRAM) 50 MG tablet; Take 1 tablet (50 mg total) by mouth at bedtime.  Dispense: 90 tablet; Refill: 0

## 2023-05-25 ENCOUNTER — Ambulatory Visit: Payer: Managed Care, Other (non HMO) | Admitting: Family Medicine

## 2023-05-25 ENCOUNTER — Other Ambulatory Visit: Payer: Self-pay | Admitting: Family Medicine

## 2023-05-25 ENCOUNTER — Encounter: Payer: Self-pay | Admitting: Family Medicine

## 2023-05-25 VITALS — BP 132/72 | HR 92 | Temp 98.0°F | Resp 12 | Ht 63.0 in | Wt 147.5 lb

## 2023-05-25 DIAGNOSIS — F331 Major depressive disorder, recurrent, moderate: Secondary | ICD-10-CM

## 2023-05-25 DIAGNOSIS — G894 Chronic pain syndrome: Secondary | ICD-10-CM | POA: Diagnosis not present

## 2023-05-25 DIAGNOSIS — Z23 Encounter for immunization: Secondary | ICD-10-CM | POA: Diagnosis not present

## 2023-05-25 DIAGNOSIS — M0600 Rheumatoid arthritis without rheumatoid factor, unspecified site: Secondary | ICD-10-CM

## 2023-05-25 DIAGNOSIS — M545 Low back pain, unspecified: Secondary | ICD-10-CM

## 2023-05-25 MED ORDER — TRAMADOL HCL 50 MG PO TABS: 50.0000 mg | ORAL_TABLET | Freq: Every day | ORAL | 0 refills | Status: DC

## 2023-05-25 MED ORDER — BUPROPION HCL ER (XL) 300 MG PO TB24
300.0000 mg | ORAL_TABLET | Freq: Every day | ORAL | 0 refills | Status: DC
Start: 2023-05-25 — End: 2023-08-26

## 2023-08-26 ENCOUNTER — Ambulatory Visit: Payer: Managed Care, Other (non HMO) | Admitting: Family Medicine

## 2023-08-26 ENCOUNTER — Encounter: Payer: Self-pay | Admitting: Family Medicine

## 2023-08-26 ENCOUNTER — Telehealth: Payer: Managed Care, Other (non HMO) | Admitting: Family Medicine

## 2023-08-26 DIAGNOSIS — N951 Menopausal and female climacteric states: Secondary | ICD-10-CM

## 2023-08-26 DIAGNOSIS — D849 Immunodeficiency, unspecified: Secondary | ICD-10-CM

## 2023-08-26 DIAGNOSIS — E538 Deficiency of other specified B group vitamins: Secondary | ICD-10-CM | POA: Diagnosis not present

## 2023-08-26 DIAGNOSIS — G894 Chronic pain syndrome: Secondary | ICD-10-CM

## 2023-08-26 DIAGNOSIS — F331 Major depressive disorder, recurrent, moderate: Secondary | ICD-10-CM

## 2023-08-26 DIAGNOSIS — M545 Low back pain, unspecified: Secondary | ICD-10-CM

## 2023-08-26 DIAGNOSIS — G4700 Insomnia, unspecified: Secondary | ICD-10-CM

## 2023-08-26 DIAGNOSIS — M0600 Rheumatoid arthritis without rheumatoid factor, unspecified site: Secondary | ICD-10-CM

## 2023-08-26 DIAGNOSIS — E785 Hyperlipidemia, unspecified: Secondary | ICD-10-CM | POA: Insufficient documentation

## 2023-08-26 DIAGNOSIS — E7800 Pure hypercholesterolemia, unspecified: Secondary | ICD-10-CM

## 2023-08-26 DIAGNOSIS — E559 Vitamin D deficiency, unspecified: Secondary | ICD-10-CM

## 2023-08-26 MED ORDER — ROSUVASTATIN CALCIUM 40 MG PO TABS: 40.0000 mg | ORAL_TABLET | Freq: Every day | ORAL | 1 refills | Status: DC

## 2023-08-26 MED ORDER — TRAMADOL HCL 50 MG PO TABS: 50.0000 mg | ORAL_TABLET | Freq: Two times a day (BID) | ORAL | 0 refills | Status: DC | PRN

## 2023-08-26 MED ORDER — DULOXETINE HCL 60 MG PO CPEP: ORAL_CAPSULE | ORAL | 1 refills | Status: DC

## 2023-08-26 MED ORDER — QUETIAPINE FUMARATE 25 MG PO TABS: 25.0000 mg | ORAL_TABLET | Freq: Every day | ORAL | 1 refills | Status: DC

## 2023-08-26 MED ORDER — BUPROPION HCL ER (XL) 300 MG PO TB24: 300.0000 mg | ORAL_TABLET | Freq: Every day | ORAL | 1 refills | Status: DC

## 2023-08-26 MED ORDER — CLONIDINE HCL 0.1 MG PO TABS: 0.1000 mg | ORAL_TABLET | Freq: Two times a day (BID) | ORAL | 1 refills | Status: DC

## 2023-08-26 NOTE — Progress Notes (Signed)
 Name: Candice Guzman   MRN: [id number]    DOB: [birth year]   Date:08/26/2023       Progress Note  Subjective  Chief Complaint  Chief Complaint  Patient presents with   Medical Management of Chronic Issues    I connected with  [name]  on 08/26/23 at  3:00 PM EST by a video enabled telemedicine application and verified that I am speaking with the correct person using two identifiers.  I discussed the limitations of evaluation and management by telemedicine and the availability of in person appointments. The patient expressed understanding and agreed to proceed with the virtual visit  Staff also discussed with the patient that there may be a patient responsible charge related to this service. Patient Location: at home  Provider Location: [facility] Additional Individuals present: alone   HPI   Seronegative Rheumatoid Arthritis: She could not tolerate methotrexate due to nausea and hair loss, she had a gap with Humira but she is now getting rx through an assistance program and resumed medication two weeks ago. She has not noticed any improvement yet.  She states she still has a lot of morning stiffness but he pain is not as intense.  Pain has been worse on hands, wrist and back, unable to make a fist in am's, very stiff.  Pain right now is 2-3/10 .   History of kidney stone: it  was a calcium stone and discussed starting on diuretics, but no recent episodes and we will continue to monitor  Unchanged   Insomnia: she has been on Lunesta for a while now since Ambien no longer on formulary, she states Lunesta no longer working, sleeping at most 2 hours per night. She took Temazepam, she is now on Seroquel and is able to stay asleep 2.5-3 hours stretches and is able to fall back asleep when she gets up during the night, she usually wakes up due to pain on her hips   Major Depression recurrent :  She states mood is affected by constant pain, we added Wellbutrin XL to her regiment in August  2024, she feels it has helped with focus and alertness but Phq 9 is higher - she states pain is worse and is affecting her mood. She is doing better on Wellbutrin 300 mg XL and duloxetine 60 mg    Menopause: she is now on Clonidine BID  symptoms are controlled    Back pain: she states she has a constant lower back tightness ,she saw Dr. Joanna Puff for back injections for DDD but insurance denied any more coverage, currently seeing Dr. Yves Dill had a repeat MRI that showed mild progression of disease . She was given some Tramadol by them, checked controlled substance database and pharmacy only dispensed 7 of the pills I sent in Nov, I will give her 120 tablets to take up to BID and to last until next visit, she will not longer get it from Dr. Council Mechanic, advised to use GoodRx if insurance does not cover it.  She still takes Flexeril prn . Back pain right now is 2/10 - using a heating pad today    Dyslipidemia: LDL has improved down from 207 to 141, she is taking 20 mg of Rosuvastatin and we will adjust dose to 40 mg today   Skin lesion: she was seen by Dermatologist, she was given Fluorouracil to use topically  Patient Active Problem List   Diagnosis Date Noted   Dyslipidemia 08/26/2023   Immunosuppressed status (HCC) 08/26/2023  Osteopenia after menopause 10/08/2022   Raynaud's disease without gangrene 08/20/2022   Mild episode of recurrent major depressive disorder (HCC) 02/17/2022   Seronegative rheumatoid arthritis (HCC) 02/17/2022   Pure hypercholesterolemia 02/17/2022   Vitamin B12 deficiency 02/17/2022   Anxiety 02/17/2022   Bilateral bunions 02/17/2022   Pain in joint of right shoulder 01/18/2019   Stiffness of right shoulder joint 01/18/2019   Osteoarthritis involving multiple joints 12/07/2018   Moderate episode of recurrent major depressive disorder (HCC) 08/27/2018   Foot drop (Right) 08/04/2018   Abnormal MRI, lumbar spine (06/21/2018) 08/04/2018   Chronic lumbar radicular pain  (L5 dermatome) (Bilateral) (R>L) 05/26/2018   Chronic lower extremity pain (1ry area of Pain) (Bilateral) (R>L) 09/21/2017   Chronic low back pain (2ry area of Pain) (Bilateral) (R>L) 09/21/2017   Spondylosis without myelopathy or radiculopathy, lumbosacral region 09/21/2017   Chronic hip pain (3ry area of Pain) (Bilateral) (R>L) 09/21/2017   Chronic shoulder pain (Bilateral) (L>R) 09/21/2017   Grade 1 Anterolisthesis of L5 over S1 & L3 over L4 09/21/2017   Grade 1 Retrolisthesis L1 over L2 & L2 over L3 09/21/2017   DDD (degenerative disc disease), lumbar 09/21/2017   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) 09/21/2017   Lumbar facet joint syndrome (Bilateral) (R>L) 09/21/2017   Chronic musculoskeletal pain 09/21/2017   Neurogenic pain 09/21/2017   Lumbar foraminal stenosis (L5-S1) (Bilateral) 09/21/2017   Lumbar lateral recess stenosis (Bilateral) (B: L4-5) (L>R: L3-4) 09/21/2017   Lumbar facet hypertrophy (Bilateral: L3-4, L4-5, and L5-S1) 09/21/2017   Chronic pain syndrome 07/08/2017   Chronic knee pain (4th area of Pain) (Bilateral) 07/08/2017   Chronic wrist pain (Fifth Area of Pain) (Bilateral) 07/08/2017   Disorder of skeletal system 07/08/2017   Other long term (current) drug therapy 07/08/2017   Chronic pain of multiple joints 05/20/2017   Insomnia, persistent 12/18/2014   Depression, major, single episode, moderate (HCC) 12/18/2014   Menopausal symptom 12/18/2014   Vitamin D deficiency 12/18/2014   Lumbosacral radiculopathy (L5) (Right) 12/12/2013   Bulge of lumbar disc without myelopathy 06/18/2011    Past Surgical History:  Procedure Laterality Date   BREAST BIOPSY Left 2010   neg   CESAREAN SECTION     FINGER ARTHROPLASTY Left 07/24/2017   Thumb   GANGLION CYST EXCISION     METACARPOPHALANGEAL JOINT ARTHROPLASTY Right 06/06/2016   Dr. Dayna Barker    SHOULDER ARTHROSCOPY WITH ROTATOR CUFF REPAIR Right 02/24/2019   Procedure: RIGHT SHOULDER ARTHROSCOPY  WITH SUBACROMIAL DECOMPRESSION, DISTAL CLAVICLE EXCISION, LYSIS OF ADHESIONS,CAPSULOTOMY,MINI OPEN ROTATOR CUFF REPAIR;  Surgeon: Juanell Fairly, MD;  Location: [facility];  Service: Orthopedics;  Laterality: Right;    Family History  Problem Relation Age of Onset   Emphysema Mother    Cancer Father 41       lung   Cancer Brother        tongue    Social History   Socioeconomic History   Marital status: Divorced    Spouse name: Not on file   Number of children: 1   Years of education: Not on file   Highest education level: High school graduate  Occupational History   Not on file  Tobacco Use   Smoking status: Never   Smokeless tobacco: Never  Vaping Use   Vaping status: Never Used  Substance and Sexual Activity   Alcohol use: No    Alcohol/week: 0.0 standard drinks of alcohol   Drug use: No   Sexual activity: Yes  Partners: Male  Other Topics Concern   Not on file  Social History Narrative   Not on file   Social Drivers of Health   Financial Resource Strain: Low Risk  (11/19/2022)   Overall Financial Resource Strain (CARDIA)    Difficulty of Paying Living Expenses: Not hard at all  Food Insecurity: No Food Insecurity (11/19/2022)   Hunger Vital Sign    Worried About Running Out of Food in the Last Year: Never true    Ran Out of Food in the Last Year: Never true  Transportation Needs: No Transportation Needs (11/19/2022)   PRAPARE - Administrator, Civil Service (Medical): No    Lack of Transportation (Non-Medical): No  Physical Activity: Insufficiently Active (11/19/2022)   Exercise Vital Sign    Days of Exercise per Week: 1 day    Minutes of Exercise per Session: 20 min  Stress: No Stress Concern Present (11/19/2022)   [employer] of Occupational Health - Occupational Stress Questionnaire    Feeling of Stress : Not at all  Social Connections: Socially Isolated (11/19/2022)   Social Connection and Isolation Panel [NHANES]    Frequency of  Communication with Friends and Family: More than three times a week    Frequency of Social Gatherings with Friends and Family: More than three times a week    Attends Religious Services: Never    Database administrator or Organizations: No    Attends Banker Meetings: Never    Marital Status: Divorced  Catering manager Violence: Not At Risk (11/19/2022)   Humiliation, Afraid, Rape, and Kick questionnaire    Fear of Current or Ex-Partner: No    Emotionally Abused: No    Physically Abused: No    Sexually Abused: No     Current Outpatient Medications:    adalimumab (HUMIRA) 40 MG/0.4ML pen, Inject into the skin., Disp: , Rfl:    buPROPion (WELLBUTRIN XL) 300 MG 24 hr tablet, Take 1 tablet (300 mg total) by mouth daily with breakfast., Disp: 90 tablet, Rfl: 0   cloNIDine (CATAPRES) 0.1 MG tablet, Take 1 tablet (0.1 mg total) by mouth 2 (two) times daily., Disp: 180 tablet, Rfl: 1   cyclobenzaprine (FLEXERIL) 10 MG tablet, Take 10 mg by mouth at bedtime., Disp: , Rfl:    DULoxetine (CYMBALTA) 60 MG capsule, TAKE 1 CAPSULE(60 MG) BY MOUTH DAILY, Disp: 90 capsule, Rfl: 1   QUEtiapine (SEROQUEL) 25 MG tablet, Take 1 tablet (25 mg total) by mouth at bedtime., Disp: 90 tablet, Rfl: 1   rosuvastatin (CRESTOR) 20 MG tablet, Take 1 tablet (20 mg total) by mouth daily., Disp: 90 tablet, Rfl: 1   traMADol (ULTRAM) 50 MG tablet, Take 1 tablet (50 mg total) by mouth at bedtime., Disp: 90 tablet, Rfl: 0  Allergies  Allergen Reactions   Codeine Itching   Methotrexate Nausea Only    I personally reviewed active problem list, medication list, allergies with the patient/caregiver today.   ROS  Ten systems reviewed and is negative except as mentioned in HPI    Objective  Virtual encounter, vitals not obtained.  There is no height or weight on file to calculate BMI.  Physical Exam  Awake, alert and oriented   No results found for this or any previous visit (from the past 72  hours).  PHQ2/9:    08/26/2023    8:56 AM 05/25/2023    3:03 PM 02/18/2023    2:54 PM 11/19/2022    1:12 PM  08/20/2022    2:55 PM  Depression screen PHQ 2/9  Decreased Interest 1 2 2 1 1   Down, Depressed, Hopeless 1 2 3 1 1   PHQ - 2 Score 2 4 5 2 2   Altered sleeping 0 3 3 0 3  Tired, decreased energy 3 3 3  0 3  Change in appetite 0 3 0 0 0  Feeling bad or failure about yourself  0 0 0 0 0  Trouble concentrating 0 1 0 0 0  Moving slowly or fidgety/restless 0 1 0 0 0  Suicidal thoughts 0 0 0 0 0  PHQ-9 Score 5 15 11 2 8   Difficult doing work/chores Not difficult at all Somewhat difficult      PHQ-2/9 Result is negative.    Fall Risk:    08/26/2023    8:56 AM 05/25/2023    3:03 PM 02/18/2023    2:54 PM 11/19/2022    1:12 PM 08/20/2022    2:55 PM  Fall Risk   Falls in the past year? 0 0 0 0 0  Number falls in past yr: 0  0 0 0  Injury with Fall? 0  0 0 0  Risk for fall due to : No Fall Risks No Fall Risks No Fall Risks No Fall Risks No Fall Risks  Follow up Falls prevention discussed;Education provided;Falls evaluation completed Falls prevention discussed Falls prevention discussed Falls prevention discussed Falls prevention discussed     Assessment & Plan  1. Seronegative rheumatoid arthritis (HCC) (Primary)  - traMADol (ULTRAM) 50 MG tablet; Take 1 tablet (50 mg total) by mouth 2 (two) times daily as needed. For chronic pain  Dispense: 120 tablet; Refill: 0  2. Depression, major, recurrent, moderate (HCC)  - QUEtiapine (SEROQUEL) 25 MG tablet; Take 1 tablet (25 mg total) by mouth at bedtime.  Dispense: 90 tablet; Refill: 1 - DULoxetine (CYMBALTA) 60 MG capsule; TAKE 1 CAPSULE(60 MG) BY MOUTH DAILY  Dispense: 90 capsule; Refill: 1 - buPROPion (WELLBUTRIN XL) 300 MG 24 hr tablet; Take 1 tablet (300 mg total) by mouth daily with breakfast.  Dispense: 90 tablet; Refill: 1  3. Chronic pain syndrome  - traMADol (ULTRAM) 50 MG tablet; Take 1 tablet (50 mg total) by mouth 2  (two) times daily as needed. For chronic pain  Dispense: 120 tablet; Refill: 0  4. Immunosuppressed status (HCC)  On Humira and has RA  5. Vitamin B12 deficiency  Continue supplements  6. Vitamin D deficiency  Continue supplements  7. Insomnia, persistent  Continue medication   8. Dyslipidemia  Adjust dose of Crestor to 40 mg   9. Depression, major, recurrent, moderate (HCC)  - QUEtiapine (SEROQUEL) 25 MG tablet; Take 1 tablet (25 mg total) by mouth at bedtime.  Dispense: 90 tablet; Refill: 1 - DULoxetine (CYMBALTA) 60 MG capsule; TAKE 1 CAPSULE(60 MG) BY MOUTH DAILY  Dispense: 90 capsule; Refill: 1 - buPROPion (WELLBUTRIN XL) 300 MG 24 hr tablet; Take 1 tablet (300 mg total) by mouth daily with breakfast.  Dispense: 90 tablet; Refill: 1  10. Pure hypercholesterolemia  - rosuvastatin (CRESTOR) 40 MG tablet; Take 1 tablet (40 mg total) by mouth daily.  Dispense: 90 tablet; Refill: 1  11. Menopausal symptom  - cloNIDine (CATAPRES) 0.1 MG tablet; Take 1 tablet (0.1 mg total) by mouth 2 (two) times daily.  Dispense: 180 tablet; Refill: 1  12. Chronic low back pain without sciatica, unspecified back pain laterality  - traMADol (ULTRAM) 50 MG tablet; Take 1 tablet (  50 mg total) by mouth 2 (two) times daily as needed. For chronic pain  Dispense: 120 tablet; Refill: 0   I discussed the assessment and treatment plan with the patient. The patient was provided an opportunity to ask questions and all were answered. The patient agreed with the plan and demonstrated an understanding of the instructions.  The patient was advised to call back or seek an in-person evaluation if the symptoms worsen or if the condition fails to improve as anticipated.  I provided 25  minutes of non-face-to-face time during this encounter.

## 2023-08-29 ENCOUNTER — Other Ambulatory Visit: Payer: Self-pay | Admitting: Family Medicine

## 2023-08-29 DIAGNOSIS — E7800 Pure hypercholesterolemia, unspecified: Secondary | ICD-10-CM

## 2023-12-16 ENCOUNTER — Other Ambulatory Visit: Payer: Self-pay | Admitting: Family Medicine

## 2023-12-16 DIAGNOSIS — Z1231 Encounter for screening mammogram for malignant neoplasm of breast: Secondary | ICD-10-CM

## 2023-12-30 ENCOUNTER — Ambulatory Visit
Admission: RE | Admit: 2023-12-30 | Discharge: 2023-12-30 | Disposition: A | Discharge status: Home / Self Care | Source: Ambulatory Visit | Attending: Family Medicine | Admitting: Family Medicine

## 2023-12-30 DIAGNOSIS — Z1231 Encounter for screening mammogram for malignant neoplasm of breast: Secondary | ICD-10-CM | POA: Insufficient documentation

## 2024-02-03 ENCOUNTER — Encounter: Payer: Self-pay | Admitting: Family Medicine

## 2024-02-03 ENCOUNTER — Telehealth: Payer: Self-pay | Admitting: Pulmonary Disease

## 2024-02-03 ENCOUNTER — Ambulatory Visit: Admitting: Family Medicine

## 2024-02-03 VITALS — BP 130/80 | HR 98 | Resp 16 | Ht 63.0 in | Wt 148.4 lb

## 2024-02-03 DIAGNOSIS — E7849 Other hyperlipidemia: Secondary | ICD-10-CM

## 2024-02-03 DIAGNOSIS — G4700 Insomnia, unspecified: Secondary | ICD-10-CM

## 2024-02-03 DIAGNOSIS — M0600 Rheumatoid arthritis without rheumatoid factor, unspecified site: Secondary | ICD-10-CM | POA: Diagnosis not present

## 2024-02-03 DIAGNOSIS — F331 Major depressive disorder, recurrent, moderate: Secondary | ICD-10-CM

## 2024-02-03 DIAGNOSIS — D849 Immunodeficiency, unspecified: Secondary | ICD-10-CM

## 2024-02-03 DIAGNOSIS — N951 Menopausal and female climacteric states: Secondary | ICD-10-CM

## 2024-02-03 DIAGNOSIS — R053 Chronic cough: Secondary | ICD-10-CM

## 2024-02-03 DIAGNOSIS — G894 Chronic pain syndrome: Secondary | ICD-10-CM

## 2024-02-03 DIAGNOSIS — M545 Low back pain, unspecified: Secondary | ICD-10-CM

## 2024-02-03 DIAGNOSIS — Z1159 Encounter for screening for other viral diseases: Secondary | ICD-10-CM

## 2024-02-03 DIAGNOSIS — Z131 Encounter for screening for diabetes mellitus: Secondary | ICD-10-CM

## 2024-02-03 DIAGNOSIS — E559 Vitamin D deficiency, unspecified: Secondary | ICD-10-CM

## 2024-02-03 DIAGNOSIS — E538 Deficiency of other specified B group vitamins: Secondary | ICD-10-CM

## 2024-02-03 MED ORDER — CYCLOBENZAPRINE HCL 10 MG PO TABS: 10.0000 mg | ORAL_TABLET | Freq: Every day | ORAL | 1 refills | Status: DC

## 2024-02-03 MED ORDER — ROSUVASTATIN CALCIUM 40 MG PO TABS: 40.0000 mg | ORAL_TABLET | Freq: Every day | ORAL | 1 refills | Status: DC

## 2024-02-03 MED ORDER — BUPROPION HCL ER (XL) 150 MG PO TB24: 150.0000 mg | ORAL_TABLET | Freq: Every day | ORAL | 1 refills | Status: DC

## 2024-02-03 MED ORDER — DULOXETINE HCL 60 MG PO CPEP: ORAL_CAPSULE | ORAL | 1 refills | Status: DC

## 2024-02-03 MED ORDER — CLONIDINE HCL 0.1 MG PO TABS: 0.1000 mg | ORAL_TABLET | Freq: Two times a day (BID) | ORAL | 1 refills | Status: DC

## 2024-02-03 MED ORDER — QUETIAPINE FUMARATE 25 MG PO TABS: 25.0000 mg | ORAL_TABLET | Freq: Every day | ORAL | 1 refills | Status: DC

## 2024-02-03 NOTE — Progress Notes (Signed)
 Name: Candice Guzman   MRN: [id number]    DOB: [birth year]   Date:02/03/2024       Progress Note  Subjective  Chief Complaint  Chief Complaint  Patient presents with   Medical Management of Chronic Issues    Stopped Wellbutrin  due to making her feel shaky   Discussed the use of AI scribe software for clinical note transcription with the patient, who gave verbal consent to proceed.  History of Present Illness Candice Guzman is a 63 year old female with seronegative rheumatoid arthritis who presents with a chronic cough and shortness of breath.  She reports a chronic cough and shortness of breath that have persisted for about two months. Initially, she developed bronchitis three to four months ago and was treated at urgent care with antibiotics and breathing treatments. Despite these interventions, her symptoms have persisted, with significant coughing, particularly at night, and shortness of breath. She denies leg pain or tightness. The cough did not start with a runny nose or congestion but was more chest-centered.  Her history of seronegative rheumatoid arthritis includes chronic musculoskeletal pain, swollen and tender joints in her hands, and pain in her feet. She has been on various treatments including Humira, which she stopped after her insurance quit paying for it, and she did not start the new medication because it was too expensive. She has tried multiple medications in the past, including sulfasalazine, methotrexate , leflunomide, Enbrel, and Simponi.  She experiences recurrent depression, which has worsened recently. She is currently taking duloxetine  but had to stop Wellbutrin  due to side effects. She feels tired and stressed and is considering resuming a lower dose of Wellbutrin .  Her social history includes significant stress related to her family. Her son's father has had multiple strokes and is undergoing treatment for bone cancer, impacting her son significantly, leading to  job loss and increased caregiving responsibilities. She is concerned about her son's well-being and financial stability.  She has a family history of early heart attacks and strokes on the female side of her family, and her brother has been diagnosed with Lewy body dementia. She also has a history of high cholesterol, which has improved with treatment.    Patient Active Problem List   Diagnosis Date Noted   Dyslipidemia 08/26/2023   Immunosuppressed status (HCC) 08/26/2023   Osteopenia after menopause 10/08/2022   Raynaud's disease without gangrene 08/20/2022   Mild episode of recurrent major depressive disorder (HCC) 02/17/2022   Seronegative rheumatoid arthritis (HCC) 02/17/2022   Pure hypercholesterolemia 02/17/2022   Vitamin B12 deficiency 02/17/2022   Anxiety 02/17/2022   Bilateral bunions 02/17/2022   Pain in joint of right shoulder 01/18/2019   Stiffness of right shoulder joint 01/18/2019   Osteoarthritis involving multiple joints 12/07/2018   Moderate episode of recurrent major depressive disorder (HCC) 08/27/2018   Foot drop (Right) 08/04/2018   Abnormal MRI, lumbar spine (06/21/2018) 08/04/2018   Chronic lumbar radicular pain (L5 dermatome) (Bilateral) (R>L) 05/26/2018   Chronic lower extremity pain (1ry area of Pain) (Bilateral) (R>L) 09/21/2017   Chronic low back pain (2ry area of Pain) (Bilateral) (R>L) 09/21/2017   Spondylosis without myelopathy or radiculopathy, lumbosacral region 09/21/2017   Chronic hip pain (3ry area of Pain) (Bilateral) (R>L) 09/21/2017   Chronic shoulder pain (Bilateral) (L>R) 09/21/2017   Grade 1 Anterolisthesis of L5 over S1 & L3 over L4 09/21/2017   Grade 1 Retrolisthesis L1 over L2 & L2 over L3 09/21/2017   DDD (degenerative disc disease), lumbar 09/21/2017  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) 09/21/2017   Lumbar facet joint syndrome (Bilateral) (R>L) 09/21/2017   Chronic musculoskeletal pain 09/21/2017   Neurogenic pain  09/21/2017   Lumbar foraminal stenosis (L5-S1) (Bilateral) 09/21/2017   Lumbar lateral recess stenosis (Bilateral) (B: L4-5) (L>R: L3-4) 09/21/2017   Lumbar facet hypertrophy (Bilateral: L3-4, L4-5, and L5-S1) 09/21/2017   Chronic pain syndrome 07/08/2017   Chronic knee pain (4th area of Pain) (Bilateral) 07/08/2017   Chronic wrist pain (Fifth Area of Pain) (Bilateral) 07/08/2017   Disorder of skeletal system 07/08/2017   Other long term (current) drug therapy 07/08/2017   Chronic pain of multiple joints 05/20/2017   Insomnia, persistent 12/18/2014   Depression, major, single episode, moderate (HCC) 12/18/2014   Menopausal symptom 12/18/2014   Vitamin D  deficiency 12/18/2014   Lumbosacral radiculopathy (L5) (Right) 12/12/2013   Bulge of lumbar disc without myelopathy 06/18/2011    Past Surgical History:  Procedure Laterality Date   BREAST BIOPSY Left 2010   neg   CESAREAN SECTION     FINGER ARTHROPLASTY Left 07/24/2017   Thumb   GANGLION CYST EXCISION     METACARPOPHALANGEAL JOINT ARTHROPLASTY Right 06/06/2016   Dr. Jyl    SHOULDER ARTHROSCOPY WITH ROTATOR CUFF REPAIR Right 02/24/2019   Procedure: RIGHT SHOULDER ARTHROSCOPY WITH SUBACROMIAL DECOMPRESSION, DISTAL CLAVICLE EXCISION, LYSIS OF ADHESIONS,CAPSULOTOMY,MINI OPEN ROTATOR CUFF REPAIR;  Surgeon: Marchia Drivers, MD;  Location: [facility];  Service: Orthopedics;  Laterality: Right;    Family History  Problem Relation Age of Onset   Emphysema Mother    Cancer Father 37       lung   Cancer Brother        tongue   Breast cancer Neg Hx     Social History   Tobacco Use   Smoking status: Never   Smokeless tobacco: Never  Substance Use Topics   Alcohol use: No    Alcohol/week: 0.0 standard drinks of alcohol     Current Outpatient Medications:    cloNIDine  (CATAPRES ) 0.1 MG tablet, Take 1 tablet (0.1 mg total) by mouth 2 (two) times daily., Disp: 180 tablet, Rfl: 1   DULoxetine  (CYMBALTA ) 60 MG capsule, TAKE 1  CAPSULE(60 MG) BY MOUTH DAILY, Disp: 90 capsule, Rfl: 1   QUEtiapine  (SEROQUEL ) 25 MG tablet, Take 1 tablet (25 mg total) by mouth at bedtime., Disp: 90 tablet, Rfl: 1   rosuvastatin  (CRESTOR ) 40 MG tablet, Take 1 tablet (40 mg total) by mouth daily., Disp: 90 tablet, Rfl: 1   traMADol  (ULTRAM ) 50 MG tablet, Take 1 tablet (50 mg total) by mouth 2 (two) times daily as needed. For chronic pain, Disp: 120 tablet, Rfl: 0   adalimumab (HUMIRA) 40 MG/0.4ML pen, Inject into the skin. (Patient not taking: Reported on 02/03/2024), Disp: , Rfl:    buPROPion  (WELLBUTRIN  XL) 300 MG 24 hr tablet, Take 1 tablet (300 mg total) by mouth daily with breakfast. (Patient not taking: Reported on 02/03/2024), Disp: 90 tablet, Rfl: 1   cyclobenzaprine  (FLEXERIL ) 10 MG tablet, Take 10 mg by mouth at bedtime. (Patient not taking: Reported on 02/03/2024), Disp: , Rfl:    fluorouracil (EFUDEX) 5 % cream, Apply 1 each topically 2 (two) times daily. (Patient not taking: Reported on 02/03/2024), Disp: , Rfl:   Allergies  Allergen Reactions   Codeine Itching   Methotrexate  Nausea Only    I personally reviewed active problem list, medication list, allergies, family history with the patient/caregiver today.   ROS  Ten systems reviewed  and is negative except as mentioned in HPI    Objective Physical Exam VITALS: P- 98, SaO2- 99% CONSTITUTIONAL: Patient appears well-developed and well-nourished. No acute distress. HEENT: Head atraumatic, normocephalic, neck supple. CARDIOVASCULAR: Normal rate, regular rhythm and normal heart sounds. No murmur heard. No BLE edema. PULMONARY: Effort normal and breath sounds normal. No respiratory distress. ABDOMINAL: There is no tenderness or distention. MUSCULOSKELETAL: Normal gait. Without gross motor or sensory deficit. PSYCHIATRIC: Patient has a normal mood and affect. Behavior is normal. Judgment and thought content normal.  Vitals:   02/03/24 1404  BP: 130/80  Pulse: 98  Resp: 16   SpO2: 99%  Weight: 148 lb 6.4 oz (67.3 kg)  Height: 5' 3 (1.6 m)    Body mass index is 26.29 kg/m.   PHQ2/9:    02/03/2024    2:03 PM 08/26/2023    8:56 AM 05/25/2023    3:03 PM 02/18/2023    2:54 PM 11/19/2022    1:12 PM  Depression screen PHQ 2/9  Decreased Interest 3 1 2 2 1   Down, Depressed, Hopeless 3 1 2 3 1   PHQ - 2 Score 6 2 4 5 2   Altered sleeping 2 0 3 3 0  Tired, decreased energy 2 3 3 3  0  Change in appetite 0 0 3 0 0  Feeling bad or failure about yourself  0 0 0 0 0  Trouble concentrating 0 0 1 0 0  Moving slowly or fidgety/restless 0 0 1 0 0  Suicidal thoughts 0 0 0 0 0  PHQ-9 Score 10 5 15 11 2   Difficult doing work/chores Somewhat difficult Not difficult at all Somewhat difficult      phq 9 is positive  Fall Risk:    02/03/2024    2:03 PM 08/26/2023    8:56 AM 05/25/2023    3:03 PM 02/18/2023    2:54 PM 11/19/2022    1:12 PM  Fall Risk   Falls in the past year? 0 0 0 0 0  Number falls in past yr: 0 0  0 0  Injury with Fall? 0 0  0 0  Risk for fall due to : No Fall Risks No Fall Risks No Fall Risks No Fall Risks No Fall Risks  Follow up Falls evaluation completed Falls prevention discussed;Education provided;Falls evaluation completed Falls prevention discussed Falls prevention discussed Falls prevention discussed     Assessment & Plan Chronic cough and shortness of breath in the setting of seronegative rheumatoid arthritis and immunosuppression Chronic cough and shortness of breath persisting over two months despite treatment. Possible causes include post-bronchial cough, autoimmune-related lung disease, or fungal infection due to immunosuppressive therapy. Increased risk for interstitial lung disease and fungal infections due to autoimmune disorder and immunosuppressive therapy. Previous chest x-ray results unavailable. - Order CT of the chest. - Refer to pulmonologist. - Continue using inhalers until seen by pulmonologist.  Seronegative rheumatoid  arthritis, poorly controlled with chronic musculoskeletal pain Poorly controlled seronegative rheumatoid arthritis with chronic musculoskeletal pain. Insurance no longer covers Humira, and alternative medication was not started due to cost. Previous treatments include sulfasalazine, methotrexate , leflunomide, Enbrel, and Simponi, with Humira being the most recent effective treatment. She did not get the shot last week  - Contact rheumatologist to discuss alternative treatment options and potential samples. - Ensure follow-up with rheumatologist.  Chronic back pain with muscle spasms Chronic back pain primarily due to muscle spasms, possibly related to arthritis in the hips and legs. Pain managed with  Flexeril  and tramadol  as needed. - Ensure tramadol  prescription is filled as needed.  Recurrent depression major moderate Recurrent depression with increased PHQ-9 score indicating worsening symptoms. Currently taking duloxetine , but Wellbutrin  was discontinued due to side effects at higher doses. - Restart Wellbutrin  at 150 mg.  Insomnia Insomnia managed with quetiapine  (Seroquel ) 25 mg, with reported improvement in sleep quality.  Postmenopausal hot flashes Postmenopausal hot flashes well-controlled with clonidine  taken twice daily.  Familial hyperlipidemia Familial hyperlipidemia with improved LDL levels from previous high values. Family history suggests possible familial hyperlipidemia. Recent LDL levels show significant improvement, reducing cardiovascular risk. - Continue current lipid management. - Monitor lipid levels regularly.  Vitamin B12 deficiency Previous vitamin B12 deficiency managed with sublingual B12 supplementation. - Continue sublingual B12 supplementation.  Vitamin D  deficiency Previous vitamin D  deficiency with slight improvement, but still requires supplementation. - Continue vitamin D  supplementation.

## 2024-02-03 NOTE — Telephone Encounter (Signed)
 LVMTCB to schedule pulmonary consult.

## 2024-02-04 ENCOUNTER — Other Ambulatory Visit (HOSPITAL_COMMUNITY): Payer: Self-pay

## 2024-02-04 NOTE — Addendum Note (Signed)
 Addended by: RENTERIA-GARCIA, Zahirah Cheslock on: 02/04/2024 08:42 AM   Modules accepted: Orders

## 2024-02-17 ENCOUNTER — Other Ambulatory Visit (HOSPITAL_COMMUNITY): Payer: Self-pay

## 2024-02-18 ENCOUNTER — Ambulatory Visit: Payer: Self-pay | Admitting: Family Medicine

## 2024-02-18 LAB — CBC WITH DIFFERENTIAL/PLATELET
Basophils Absolute: 0.1 x10E3/uL (ref 0.0–0.2)
Basos: 1 %
EOS (ABSOLUTE): 0.7 x10E3/uL — ABNORMAL HIGH (ref 0.0–0.4)
Eos: 12 %
Hematocrit: 44.2 % (ref 34.0–46.6)
Hemoglobin: 14.6 g/dL (ref 11.1–15.9)
Immature Grans (Abs): 0 x10E3/uL (ref 0.0–0.1)
Immature Granulocytes: 0 %
Lymphocytes Absolute: 1.9 x10E3/uL (ref 0.7–3.1)
Lymphs: 33 %
MCH: 29.9 pg (ref 26.6–33.0)
MCHC: 33 g/dL (ref 31.5–35.7)
MCV: 90 fL (ref 79–97)
Monocytes Absolute: 0.4 x10E3/uL (ref 0.1–0.9)
Monocytes: 7 %
Neutrophils Absolute: 2.8 x10E3/uL (ref 1.4–7.0)
Neutrophils: 47 %
Platelets: 310 x10E3/uL (ref 150–450)
RBC: 4.89 x10E6/uL (ref 3.77–5.28)
RDW: 13 % (ref 11.7–15.4)
WBC: 5.8 x10E3/uL (ref 3.4–10.8)

## 2024-02-18 LAB — COMPREHENSIVE METABOLIC PANEL WITH GFR
ALT: 26 IU/L (ref 0–32)
AST: 28 IU/L (ref 0–40)
Albumin: 4.4 g/dL (ref 3.9–4.9)
Alkaline Phosphatase: 146 IU/L — ABNORMAL HIGH (ref 44–121)
BUN/Creatinine Ratio: 10 — ABNORMAL LOW (ref 12–28)
BUN: 11 mg/dL (ref 8–27)
Bilirubin Total: 0.5 mg/dL (ref 0.0–1.2)
CO2: 25 mmol/L (ref 20–29)
Calcium: 9.5 mg/dL (ref 8.7–10.3)
Chloride: 104 mmol/L (ref 96–106)
Creatinine, Ser: 1.05 mg/dL — ABNORMAL HIGH (ref 0.57–1.00)
Globulin, Total: 2.4 g/dL (ref 1.5–4.5)
Glucose: 104 mg/dL — ABNORMAL HIGH (ref 70–99)
Potassium: 3.9 mmol/L (ref 3.5–5.2)
Sodium: 141 mmol/L (ref 134–144)
Total Protein: 6.8 g/dL (ref 6.0–8.5)
eGFR: 60 mL/min/1.73 (ref >59)

## 2024-02-18 LAB — LIPID PANEL
Chol/HDL Ratio: 2.8 ratio (ref 0.0–4.4)
Cholesterol, Total: 171 mg/dL (ref 100–199)
HDL: 61 mg/dL (ref >39)
LDL Chol Calc (NIH): 89 mg/dL (ref 0–99)
Triglycerides: 122 mg/dL (ref 0–149)
VLDL Cholesterol Cal: 21 mg/dL (ref 5–40)

## 2024-02-18 LAB — HEMOGLOBIN A1C
Est. average glucose Bld gHb Est-mCnc: 111 mg/dL
Hgb A1c MFr Bld: 5.5 % (ref 4.8–5.6)

## 2024-02-18 LAB — B12 AND FOLATE PANEL
Folate: 11.8 ng/mL (ref >3.0)
Vitamin B-12: 521 pg/mL (ref 232–1245)

## 2024-02-18 LAB — HEPATITIS B SURFACE ANTIBODY,QUALITATIVE: Hep B Surface Ab, Qual: NONREACTIVE

## 2024-02-18 LAB — VITAMIN D 25 HYDROXY (VIT D DEFICIENCY, FRACTURES): Vit D, 25-Hydroxy: 29.6 ng/mL — ABNORMAL LOW (ref 30.0–100.0)

## 2024-03-03 ENCOUNTER — Ambulatory Visit

## 2024-03-16 ENCOUNTER — Ambulatory Visit
Admission: RE | Admit: 2024-03-16 | Discharge: 2024-03-16 | Disposition: A | Discharge status: Home / Self Care | Source: Ambulatory Visit | Attending: Pulmonary Disease | Admitting: Pulmonary Disease

## 2024-03-16 ENCOUNTER — Encounter: Payer: Self-pay | Admitting: Pulmonary Disease

## 2024-03-16 ENCOUNTER — Ambulatory Visit: Admitting: Pulmonary Disease

## 2024-03-16 ENCOUNTER — Other Ambulatory Visit
Admission: RE | Admit: 2024-03-16 | Discharge: 2024-03-16 | Disposition: A | Discharge status: Home / Self Care | Source: Ambulatory Visit | Attending: Pulmonary Disease | Admitting: Pulmonary Disease

## 2024-03-16 VITALS — BP 170/110 | HR 108 | Temp 98.1°F | Ht 63.0 in | Wt 148.6 lb

## 2024-03-16 DIAGNOSIS — J45909 Unspecified asthma, uncomplicated: Secondary | ICD-10-CM

## 2024-03-16 DIAGNOSIS — Z825 Family history of asthma and other chronic lower respiratory diseases: Secondary | ICD-10-CM

## 2024-03-16 DIAGNOSIS — R000 Tachycardia, unspecified: Secondary | ICD-10-CM | POA: Diagnosis not present

## 2024-03-16 DIAGNOSIS — R0602 Shortness of breath: Secondary | ICD-10-CM | POA: Diagnosis present

## 2024-03-16 DIAGNOSIS — D721 Eosinophilia, unspecified: Secondary | ICD-10-CM

## 2024-03-16 DIAGNOSIS — I10 Essential (primary) hypertension: Secondary | ICD-10-CM

## 2024-03-16 LAB — NITRIC OXIDE: Nitric Oxide: 37

## 2024-03-16 MED ORDER — BUDESONIDE-FORMOTEROL FUMARATE 160-4.5 MCG/ACT IN AERO: 2.0000 | INHALATION_SPRAY | Freq: Two times a day (BID) | RESPIRATORY_TRACT | 11 refills | Status: AC

## 2024-03-16 MED ORDER — ALBUTEROL SULFATE HFA 108 (90 BASE) MCG/ACT IN AERS: 2.0000 | INHALATION_SPRAY | Freq: Four times a day (QID) | RESPIRATORY_TRACT | 2 refills | Status: AC | PRN

## 2024-03-16 NOTE — Patient Instructions (Signed)
 VISIT SUMMARY:  Today, you were seen for persistent shortness of breath and cough, which have been ongoing since March. We discussed your symptoms, including wheezing and difficulty breathing, and reviewed your medical history, including high blood pressure and episodes of rapid heart rate. We also considered your family history of emphysema and your current medications.  YOUR PLAN:  -ASTHMA WITH PERSISTENT AIRWAY INFLAMMATION: Asthma is a condition where your airways become inflamed and narrow, making it hard to breathe. We confirmed your asthma through elevated eosinophil levels and airway inflammation. You will start using a daily maintenance inhaler to control your asthma and an albuterol  inhaler for emergencies. We will also conduct allergy testing and check your insurance for inhaler options.  -SHORTNESS OF BREATH AND COUGH: Your chronic shortness of breath and cough may be related to asthma and poorly controlled high blood pressure. We will perform a chest x-ray and a blood test to check for hereditary emphysema, given your family history.  -ESSENTIAL HYPERTENSION, POORLY CONTROLLED: Essential hypertension is high blood pressure with no identifiable cause. Your blood pressure has been very high, which can contribute to your breathing issues and heart strain. We recommend contacting your primary care provider to better manage your blood pressure and will refer you to a cardiologist for further evaluation.  -TACHYCARDIA: Tachycardia is a condition where your heart beats faster than normal. This may be worsened by your high blood pressure and asthma. We will refer you to a cardiologist to evaluate your fast heart rate.  INSTRUCTIONS:  Please follow up with your primary care provider to discuss better management of your blood pressure. Additionally, you will need to see a cardiologist for further evaluation of your tachycardia. We will also conduct a chest x-ray and a blood test for hereditary  emphysema. Make sure to use your new inhalers as prescribed and attend the allergy testing once scheduled.

## 2024-03-16 NOTE — Progress Notes (Signed)
 Subjective:    Patient ID: Candice Guzman, female    DOB: [birth year], 63 y.o.   MRN: [id number]  Patient Care Team: Sowles, Krichna, MD as PCP - General (Family Medicine) Darliss Rogue, MD as PCP - Cardiology (Cardiology)  Chief Complaint  Patient presents with   Consult    Cough with green phlegm. Shortness of breath on exertion. Occasional wheezing.     BACKGROUND: This is a 63 year old lifelong never smoker who presents for evaluation of shortness of breath on exertion and cough productive of greenish sputum at times.  She is kindly referred by Dr. Dorette Loron.   HPI Discussed the use of AI scribe software for clinical note transcription with the patient, who gave verbal consent to proceed.  History of Present Illness   Candice Guzman is a 63 year old female with issues with recurrent bronchitis who presents with shortness of breath and cough.  She has been experiencing persistent shortness of breath and cough since March. Initial treatment at a walk-in clinic did not alleviate her symptoms. She describes ongoing wheezing and shortness of breath, which have not improved with prednisone . Her symptoms are severe enough to prevent her from finishing sentences and cause difficulty breathing after minimal exertion, such as walking to the bathroom at night.  She denies any history of smoking. Her family history is significant for emphysema in her mother and grandmother, neither of whom smoked.  Her blood pressure has been running high, described as 'really high'. She takes clonidine  (Catapres ) twice a day for blood pressure management. She also experiences episodes of rapid heart rate, which have been ongoing for a significant period. She has a history of seeing a cardiologist about four to five years ago, where a stress test was performed, but she is unsure of the findings. She experiences swelling in her feet and has a history of rheumatoid arthritis, which contributes to  her pain and possibly her heart rate issues.  No history of childhood asthma. She reports that her heart feels like it is beating fast, and she experiences swelling in her legs. No recent chest x-ray.     Review of Systems A 10 point review of systems was performed and it is as noted above otherwise negative.   Past Medical History:  Diagnosis Date   Depression    Dyspnea    Insomnia    Lumbar herniated disc    Menopause    Rheumatoid arthritis (HCC)    Shoulder pain    Vitamin D  deficiency     Past Surgical History:  Procedure Laterality Date   BREAST BIOPSY Left 2010   neg   CESAREAN SECTION     FINGER ARTHROPLASTY Left 07/24/2017   Thumb   GANGLION CYST EXCISION     METACARPOPHALANGEAL JOINT ARTHROPLASTY Right 06/06/2016   Dr. Jyl    SHOULDER ARTHROSCOPY WITH ROTATOR CUFF REPAIR Right 02/24/2019   Procedure: RIGHT SHOULDER ARTHROSCOPY WITH SUBACROMIAL DECOMPRESSION, DISTAL CLAVICLE EXCISION, LYSIS OF ADHESIONS,CAPSULOTOMY,MINI OPEN ROTATOR CUFF REPAIR;  Surgeon: Marchia Drivers, MD;  Location: [facility];  Service: Orthopedics;  Laterality: Right;    Patient Active Problem List   Diagnosis Date Noted   Dyslipidemia 08/26/2023   Immunosuppressed status 08/26/2023   Osteopenia after menopause 10/08/2022   Raynaud's disease without gangrene 08/20/2022   Mild episode of recurrent major depressive disorder 02/17/2022   Seronegative rheumatoid arthritis (HCC) 02/17/2022   Pure hypercholesterolemia 02/17/2022   Vitamin B12 deficiency 02/17/2022   Anxiety 02/17/2022   Bilateral  bunions 02/17/2022   Pain in joint of right shoulder 01/18/2019   Stiffness of right shoulder joint 01/18/2019   Osteoarthritis involving multiple joints 12/07/2018   Moderate episode of recurrent major depressive disorder (HCC) 08/27/2018   Foot drop (Right) 08/04/2018   Abnormal MRI, lumbar spine (06/21/2018) 08/04/2018   Chronic lumbar radicular pain (L5 dermatome) (Bilateral) (R>L)  05/26/2018   Chronic lower extremity pain (1ry area of Pain) (Bilateral) (R>L) 09/21/2017   Chronic low back pain without sciatica 09/21/2017   Spondylosis without myelopathy or radiculopathy, lumbosacral region 09/21/2017   Chronic hip pain (3ry area of Pain) (Bilateral) (R>L) 09/21/2017   Chronic shoulder pain (Bilateral) (L>R) 09/21/2017   Grade 1 Anterolisthesis of L5 over S1 & L3 over L4 09/21/2017   Grade 1 Retrolisthesis L1 over L2 & L2 over L3 09/21/2017   DDD (degenerative disc disease), lumbar 09/21/2017   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) 09/21/2017   Lumbar facet joint syndrome (Bilateral) (R>L) 09/21/2017   Chronic musculoskeletal pain 09/21/2017   Neurogenic pain 09/21/2017   Lumbar foraminal stenosis (L5-S1) (Bilateral) 09/21/2017   Lumbar lateral recess stenosis (Bilateral) (B: L4-5) (L>R: L3-4) 09/21/2017   Lumbar facet hypertrophy (Bilateral: L3-4, L4-5, and L5-S1) 09/21/2017   Chronic pain syndrome 07/08/2017   Chronic knee pain (4th area of Pain) (Bilateral) 07/08/2017   Chronic wrist pain (Fifth Area of Pain) (Bilateral) 07/08/2017   Disorder of skeletal system 07/08/2017   Other long term (current) drug therapy 07/08/2017   Chronic pain of multiple joints 05/20/2017   Insomnia, persistent 12/18/2014   Depression, major, single episode, moderate (HCC) 12/18/2014   Menopausal symptom 12/18/2014   Vitamin D  deficiency 12/18/2014   Lumbosacral radiculopathy (L5) (Right) 12/12/2013   Bulge of lumbar disc without myelopathy 06/18/2011    Family History  Problem Relation Age of Onset   Emphysema Mother    Cancer Father 31       lung   Cancer Brother        tongue   Dementia Brother    Breast cancer Neg Hx     Social History   Tobacco Use   Smoking status: Never   Smokeless tobacco: Never  Substance Use Topics   Alcohol use: No    Alcohol/week: 0.0 standard drinks of alcohol    Allergies  Allergen Reactions   Codeine Itching    Methotrexate  Nausea Only    Current Meds  Medication Sig   albuterol  (VENTOLIN  HFA) 108 (90 Base) MCG/ACT inhaler Inhale 2 puffs into the lungs every 6 (six) hours as needed for wheezing or shortness of breath.   budesonide -formoterol  (SYMBICORT ) 160-4.5 MCG/ACT inhaler Inhale 2 puffs into the lungs 2 (two) times daily. Rinse mouth well after use.   buPROPion  (WELLBUTRIN  XL) 150 MG 24 hr tablet Take 1 tablet (150 mg total) by mouth daily with breakfast.   cloNIDine  (CATAPRES ) 0.1 MG tablet Take 1 tablet (0.1 mg total) by mouth 2 (two) times daily.   cyclobenzaprine  (FLEXERIL ) 10 MG tablet Take 1 tablet (10 mg total) by mouth at bedtime.   DULoxetine  (CYMBALTA ) 60 MG capsule TAKE 1 CAPSULE(60 MG) BY MOUTH DAILY   QUEtiapine  (SEROQUEL ) 25 MG tablet Take 1 tablet (25 mg total) by mouth at bedtime.   rosuvastatin  (CRESTOR ) 40 MG tablet Take 1 tablet (40 mg total) by mouth daily.   traMADol  (ULTRAM ) 50 MG tablet Take 1 tablet (50 mg total) by mouth 2 (two) times daily as needed. For chronic pain  Immunization History  Administered Date(s) Administered   Influenza, Seasonal, Injecte, Preservative Fre 05/25/2023   Influenza,inj,Quad PF,6+ Mos 03/21/2015, 04/11/2016, 05/26/2018, 03/08/2019, 05/21/2020, 05/21/2021   Influenza,inj,quad, With Preservative 04/10/2016   Influenza-Unspecified 04/06/2013, 04/13/2014, 04/20/2017   Pneumococcal Polysaccharide-23 05/20/2017   Tdap 01/22/2012, 11/19/2022   Zoster Recombinant(Shingrix) 05/21/2020, 01/18/2021        Objective:     BP (!) 170/110   Pulse (!) 108   Temp 98.1 F (36.7 C) (Temporal)   Ht 5' 3 (1.6 m)   Wt 148 lb 9.6 oz (67.4 kg)   LMP 12/18/2014   SpO2 99%   BMI 26.32 kg/m   SpO2: 99 %  GENERAL: Well-developed, well-nourished woman, no acute distress, fully ambulatory.  No conversational dyspnea. HEAD: Normocephalic, atraumatic.  EYES: Pupils equal, round, reactive to light.  No scleral icterus.  MOUTH: Dentition intact,  oral mucosa moist.  No thrush. NECK: Supple. No thyromegaly. Trachea midline. No JVD.  No adenopathy. PULMONARY: Good air entry bilaterally.  No adventitious sounds. CARDIOVASCULAR: S1 and S2.  Tachycardic with regular rhythm.  No rubs, murmurs or gallops heard. ABDOMEN: Benign. MUSCULOSKELETAL: No joint deformity, no clubbing, no edema.  NEUROLOGIC: No overt focal deficit, no gait disturbance, speech is fluent. SKIN: Intact,warm,dry. PSYCH: Mood and behavior normal.  Lab Results  Component Value Date   NITRICOXIDE 37 03/16/2024  *There is intermediate level of type II inflammation noted    Assessment & Plan:     ICD-10-CM   1. Shortness of breath  R06.02 Nitric oxide     Alpha-1 antitrypsin phenotype    DG Chest 2 View    Pulmonary function test    2. Persistent asthma without complication, unspecified asthma severity  J45.909 Allergen Panel (27) + IGE    3. Eosinophilia, unspecified type  D72.10 Allergen Panel (27) + IGE    4. Family history of emphysema  Z82.5 Alpha-1 antitrypsin phenotype    5. Hypertension, unspecified type  I10     6. Tachycardia  R00.0      Orders Placed This Encounter  Procedures   DG Chest 2 View    Standing Status:   Future    Number of Occurrences:   1    Expiration Date:   03/16/2025    Reason for Exam (SYMPTOM  OR DIAGNOSIS REQUIRED):   Shortness of breath    Preferred imaging location?:   [facility]   Alpha-1 antitrypsin phenotype    Standing Status:   Future    Expiration Date:   03/16/2025   Allergen Panel (27) + IGE    Standing Status:   Future    Number of Occurrences:   1    Expiration Date:   03/16/2025   Nitric oxide    Pulmonary function test    Standing Status:   Future    Expiration Date:   03/16/2025    Where should this test be performed?:   Outpatient [facility]    What type of PFT is being ordered?:   Full PFT   Meds ordered this encounter  Medications   albuterol  (VENTOLIN  HFA) 108 (90 Base) MCG/ACT inhaler     Sig: Inhale 2 puffs into the lungs every 6 (six) hours as needed for wheezing or shortness of breath.    Dispense:  8 g    Refill:  2   budesonide -formoterol  (SYMBICORT ) 160-4.5 MCG/ACT inhaler    Sig: Inhale 2 puffs into the lungs 2 (two) times daily. Rinse mouth well after use.  Dispense:  10.2 g    Refill:  11   Discussion:    Asthma with persistent airway inflammation Asthma confirmed by elevated eosinophil levels and airway inflammation. Symptoms include shortness of breath and cough. No history of childhood asthma. Asthma can develop at any age and may be exacerbated by allergies. - Prescribe daily maintenance inhaler for asthma control - Prescribe albuterol  inhaler for emergency use - Order allergy testing - Check insurance coverage for inhaler options  Shortness of breath and cough Chronic shortness of breath and cough, not relieved by previous treatments including prednisone . Symptoms may be related to asthma and poorly controlled hypertension. Differential includes hereditary emphysema due to family history, which will be evaluated. - Order chest x-ray - Order blood test for hereditary emphysema (alpha-1 antitrypsin deficiency)  Essential hypertension, poorly controlled Poorly controlled essential hypertension with episodes of very high blood pressure. Current medication is clonidine . High blood pressure may contribute to shortness of breath and cardiac strain. Discussed the need for better blood pressure management and potential adjustment of medication with primary care provider. - Advise to contact primary care provider for blood pressure management - Refer to cardiologist for further evaluation  Tachycardia Chronic tachycardia with fast heart rate. Previous cardiac evaluation showed normal heart structure but did not address tachycardia. Tachycardia may be exacerbated by poorly controlled hypertension and asthma. - Refer to cardiologist for evaluation of tachycardia      Advised if symptoms do not improve or worsen, to please contact office for sooner follow up or seek emergency care.    I spent 45 minutes of dedicated to the care of this patient on the date of this encounter to include pre-visit review of records, face-to-face time with the patient discussing conditions above, post visit ordering of testing, clinical documentation with the electronic health record, making appropriate referrals as documented, and communicating necessary findings to members of the patients care team.   C. Leita Sanders, MD Advanced Bronchoscopy [facility]    *This note was dictated using voice recognition software/Dragon.  Despite best efforts to proofread, errors can occur which can change the meaning. Any transcriptional errors that result from this process are unintentional and may not be fully corrected at the time of dictation.

## 2024-03-18 LAB — ALLERGEN PANEL (27) + IGE
Alternaria Alternata IgE: <0.1 kU/L
Aspergillus Fumigatus IgE: <0.1 kU/L
Bahia Grass IgE: <0.1 kU/L
Bermuda Grass IgE: <0.1 kU/L
Cat Dander IgE: 0.4 kU/L — AB
Cedar, Mountain IgE: <0.1 kU/L
Cladosporium Herbarum IgE: <0.1 kU/L
Cocklebur IgE: <0.1 kU/L
Cockroach, American IgE: <0.1 kU/L
Common Silver Birch IgE: <0.1 kU/L
D Farinae IgE: 2.42 kU/L — AB
D Pteronyssinus IgE: 1.83 kU/L — AB
Dog Dander IgE: 0.12 kU/L — AB
Elm, American IgE: <0.1 kU/L
Hickory, White IgE: <0.1 kU/L
IgE (Immunoglobulin E), Serum: 521 [IU]/mL — ABNORMAL HIGH (ref 6–495)
Johnson Grass IgE: <0.1 kU/L
Kentucky Bluegrass IgE: 5.14 kU/L — AB
Maple/Box Elder IgE: <0.1 kU/L
Mucor Racemosus IgE: <0.1 kU/L
Oak, White IgE: 3.28 kU/L — AB
Penicillium Chrysogen IgE: <0.1 kU/L
Pigweed, Rough IgE: <0.1 kU/L
Plantain, English IgE: <0.1 kU/L
Ragweed, Short IgE: 0.5 kU/L — AB
Setomelanomma Rostrat: <0.1 kU/L
Timothy Grass IgE: 2.31 kU/L — AB
White Mulberry IgE: <0.1 kU/L

## 2024-03-18 LAB — ALPHA-1-ANTITRYPSIN PHENOTYP: A-1 Antitrypsin, Ser: 127 mg/dL (ref 101–187)

## 2024-03-19 ENCOUNTER — Ambulatory Visit: Payer: Self-pay | Admitting: Pulmonary Disease

## 2024-03-24 ENCOUNTER — Encounter: Payer: Self-pay | Admitting: Family Medicine

## 2024-03-29 ENCOUNTER — Encounter: Payer: Self-pay | Admitting: Family Medicine

## 2024-03-29 ENCOUNTER — Ambulatory Visit: Admitting: Family Medicine

## 2024-03-29 VITALS — BP 130/78 | HR 102 | Resp 16 | Ht 63.0 in | Wt 146.3 lb

## 2024-03-29 DIAGNOSIS — K219 Gastro-esophageal reflux disease without esophagitis: Secondary | ICD-10-CM

## 2024-03-29 DIAGNOSIS — M545 Low back pain, unspecified: Secondary | ICD-10-CM | POA: Diagnosis not present

## 2024-03-29 DIAGNOSIS — I7300 Raynaud's syndrome without gangrene: Secondary | ICD-10-CM

## 2024-03-29 DIAGNOSIS — J4540 Moderate persistent asthma, uncomplicated: Secondary | ICD-10-CM

## 2024-03-29 DIAGNOSIS — G894 Chronic pain syndrome: Secondary | ICD-10-CM

## 2024-03-29 DIAGNOSIS — G8929 Other chronic pain: Secondary | ICD-10-CM

## 2024-03-29 DIAGNOSIS — M0600 Rheumatoid arthritis without rheumatoid factor, unspecified site: Secondary | ICD-10-CM

## 2024-03-29 DIAGNOSIS — R1313 Dysphagia, pharyngeal phase: Secondary | ICD-10-CM

## 2024-03-29 MED ORDER — OMEPRAZOLE 40 MG PO CPDR: 40.0000 mg | DELAYED_RELEASE_CAPSULE | Freq: Every day | ORAL | 1 refills | Status: DC

## 2024-03-29 NOTE — Progress Notes (Signed)
 Name: Candice Guzman   MRN: [id number]    DOB: September 29, 1960   Date:03/29/2024       Progress Note  Subjective  Chief Complaint  Chief Complaint  Patient presents with   Form Completion    Pa is wanting Medical accommodations for work due to arthritis and Breathing hx    Discussed the use of AI scribe software for clinical note transcription with the patient, who gave verbal consent to proceed.  History of Present Illness Candice Guzman is a 63 year old female with seronegative rheumatoid arthritis and moderate persistent asthma who presents for work accommodation paperwork.  She experiences chronic pain due to seronegative rheumatoid arthritis, particularly in her hands. She requires frequent breaks, uses ice packs, and sometimes immerses her hands in hot water. Her condition worsens in cold weather, necessitating the use of a heating pad. She is currently taking Amjevita 40 mg every two weeks at home, which replaced Humira in July after her insurance stopped covering it. She reports that she may have had a little improvement with this medication, but she had a lot of pain earlier this year.  Her moderate persistent asthma contributes to difficulty with physical exertion, such as walking, which would be required if she returned to the office. She experiences shortness of breath and has been evaluated by a pulmonologist, who noted eosinophilia. She is awaiting further evaluation by a cardiologist on October 1st.  She reports gastrointestinal symptoms, including recurrent vomiting and a sensation of food getting stuck, which has worsened over the past year. She has not seen a gastroenterologist yet and is not currently taking any reflux medication, only using Tums for symptomatic relief. She describes the sensation as 'stuck' and sometimes 'bubbled,' leading to nausea and vomiting.  Additionally, she has chronic musculoskeletal pain, including low back pain. She reports that her pain affects  her balance and mobility, and she recently fell, injuring her foot. She also experiences Raynaud's disease, which exacerbates her pain in cold weather.  She mentions that her heart rate is often elevated due to her shortness of breath, and she has been experiencing increased pain earlier this year. She is considering retirement due to the impact of her health conditions on her ability to work.    Patient Active Problem List   Diagnosis Date Noted   Dyslipidemia 08/26/2023   Immunosuppressed status 08/26/2023   Osteopenia after menopause 10/08/2022   Raynaud's disease without gangrene 08/20/2022   Mild episode of recurrent major depressive disorder 02/17/2022   Seronegative rheumatoid arthritis (HCC) 02/17/2022   Pure hypercholesterolemia 02/17/2022   Vitamin B12 deficiency 02/17/2022   Anxiety 02/17/2022   Bilateral bunions 02/17/2022   Pain in joint of right shoulder 01/18/2019   Stiffness of right shoulder joint 01/18/2019   Osteoarthritis involving multiple joints 12/07/2018   Moderate episode of recurrent major depressive disorder (HCC) 08/27/2018   Foot drop (Right) 08/04/2018   Abnormal MRI, lumbar spine (06/21/2018) 08/04/2018   Chronic lumbar radicular pain (L5 dermatome) (Bilateral) (R>L) 05/26/2018   Chronic lower extremity pain (1ry area of Pain) (Bilateral) (R>L) 09/21/2017   Chronic low back pain without sciatica 09/21/2017   Spondylosis without myelopathy or radiculopathy, lumbosacral region 09/21/2017   Chronic hip pain (3ry area of Pain) (Bilateral) (R>L) 09/21/2017   Chronic shoulder pain (Bilateral) (L>R) 09/21/2017   Grade 1 Anterolisthesis of L5 over S1 & L3 over L4 09/21/2017   Grade 1 Retrolisthesis L1 over L2 & L2 over L3 09/21/2017   DDD (degenerative  disc disease), lumbar 09/21/2017   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) 09/21/2017   Lumbar facet joint syndrome (Bilateral) (R>L) 09/21/2017   Chronic musculoskeletal pain 09/21/2017    Neurogenic pain 09/21/2017   Lumbar foraminal stenosis (L5-S1) (Bilateral) 09/21/2017   Lumbar lateral recess stenosis (Bilateral) (B: L4-5) (L>R: L3-4) 09/21/2017   Lumbar facet hypertrophy (Bilateral: L3-4, L4-5, and L5-S1) 09/21/2017   Chronic pain syndrome 07/08/2017   Chronic knee pain (4th area of Pain) (Bilateral) 07/08/2017   Chronic wrist pain (Fifth Area of Pain) (Bilateral) 07/08/2017   Disorder of skeletal system 07/08/2017   Other long term (current) drug therapy 07/08/2017   Chronic pain of multiple joints 05/20/2017   Insomnia, persistent 12/18/2014   Depression, major, single episode, moderate (HCC) 12/18/2014   Menopausal symptom 12/18/2014   Vitamin D  deficiency 12/18/2014   Lumbosacral radiculopathy (L5) (Right) 12/12/2013   Bulge of lumbar disc without myelopathy 06/18/2011    Past Surgical History:  Procedure Laterality Date   BREAST BIOPSY Left 2010   neg   CESAREAN SECTION     FINGER ARTHROPLASTY Left 07/24/2017   Thumb   GANGLION CYST EXCISION     METACARPOPHALANGEAL JOINT ARTHROPLASTY Right 06/06/2016   Dr. Jyl    SHOULDER ARTHROSCOPY WITH ROTATOR CUFF REPAIR Right 02/24/2019   Procedure: RIGHT SHOULDER ARTHROSCOPY WITH SUBACROMIAL DECOMPRESSION, DISTAL CLAVICLE EXCISION, LYSIS OF ADHESIONS,CAPSULOTOMY,MINI OPEN ROTATOR CUFF REPAIR;  Surgeon: Marchia Drivers, MD;  Location: [facility];  Service: Orthopedics;  Laterality: Right;    Family History  Problem Relation Age of Onset   Emphysema Mother    Cancer Father 74       lung   Cancer Brother        tongue   Dementia Brother    Breast cancer Neg Hx     Social History   Tobacco Use   Smoking status: Never   Smokeless tobacco: Never  Substance Use Topics   Alcohol use: No    Alcohol/week: 0.0 standard drinks of alcohol     Current Outpatient Medications:    albuterol  (VENTOLIN  HFA) 108 (90 Base) MCG/ACT inhaler, Inhale 2 puffs into the lungs every 6 (six) hours as needed for wheezing or  shortness of breath., Disp: 8 g, Rfl: 2   budesonide -formoterol  (SYMBICORT ) 160-4.5 MCG/ACT inhaler, Inhale 2 puffs into the lungs 2 (two) times daily. Rinse mouth well after use., Disp: 10.2 g, Rfl: 11   buPROPion  (WELLBUTRIN  XL) 150 MG 24 hr tablet, Take 1 tablet (150 mg total) by mouth daily with breakfast., Disp: 90 tablet, Rfl: 1   cloNIDine  (CATAPRES ) 0.1 MG tablet, Take 1 tablet (0.1 mg total) by mouth 2 (two) times daily., Disp: 180 tablet, Rfl: 1   cyclobenzaprine  (FLEXERIL ) 10 MG tablet, Take 1 tablet (10 mg total) by mouth at bedtime., Disp: 90 tablet, Rfl: 1   DULoxetine  (CYMBALTA ) 60 MG capsule, TAKE 1 CAPSULE(60 MG) BY MOUTH DAILY, Disp: 90 capsule, Rfl: 1   QUEtiapine  (SEROQUEL ) 25 MG tablet, Take 1 tablet (25 mg total) by mouth at bedtime., Disp: 90 tablet, Rfl: 1   rosuvastatin  (CRESTOR ) 40 MG tablet, Take 1 tablet (40 mg total) by mouth daily., Disp: 90 tablet, Rfl: 1   traMADol  (ULTRAM ) 50 MG tablet, Take 1 tablet (50 mg total) by mouth 2 (two) times daily as needed. For chronic pain, Disp: 120 tablet, Rfl: 0  Allergies  Allergen Reactions   Codeine Itching   Methotrexate  Nausea Only    I personally reviewed active problem  list, medication list, allergies with the patient/caregiver today.   ROS  Ten systems reviewed and is negative except as mentioned in HPI    Objective Physical Exam CONSTITUTIONAL: Patient appears well-developed and well-nourished. No distress. HEENT: Head atraumatic, normocephalic, neck supple. CARDIOVASCULAR: Normal rate, regular rhythm and normal heart sounds. No murmur heard. No BLE edema. PULMONARY: Effort normal and breath sounds normal. No respiratory distress. ABDOMINAL: There is no tenderness or distention. MUSCULOSKELETAL: Normal gait. Without gross motor or sensory deficit. Deformities in fingers with increasing distance and curvature. PSYCHIATRIC: Patient has a normal mood and affect. Behavior is normal. Judgment and thought content  normal.  Vitals:   03/29/24 1100 03/29/24 1103  BP: 130/78   Pulse: (!) 114 (!) 102  Resp: 16   Weight: 146 lb 4.8 oz (66.4 kg)   Height: 5' 3 (1.6 m)     Body mass index is 25.92 kg/m.  Recent Results (from the past 2160 hours)  VITAMIN D  25 Hydroxy (Vit-D Deficiency, Fractures)     Status: Abnormal   Collection Time: 02/17/24 10:14 AM  Result Value Ref Range   Vit D, 25-Hydroxy 29.6 (L) 30.0 - 100.0 ng/mL    Comment: Vitamin D  deficiency has been defined by the Institute of Medicine and an Endocrine Society practice guideline as a level of serum 25-OH vitamin D  less than 20 ng/mL (1,2). The Endocrine Society went on to further define vitamin D  insufficiency as a level between 21 and 29 ng/mL (2). 1. IOM (Institute of Medicine). 2010. Dietary reference    intakes for calcium  and D. [place]: The    [employer]. 2. Holick MF, Binkley [place], Bischoff-Ferrari HA, et al.    Evaluation, treatment, and prevention of vitamin D     deficiency: an Endocrine Society clinical practice    guideline. JCEM. 2011 Jul; 96(7):1911-30.   B12 and Folate Panel     Status: None   Collection Time: 02/17/24 10:14 AM  Result Value Ref Range   Vitamin B-12 521 232 - 1,245 pg/mL   Folate 11.8 >3.0 ng/mL    Comment: A serum folate concentration of less than 3.1 ng/mL is considered to represent clinical deficiency.   CBC with Differential/Platelet     Status: Abnormal   Collection Time: 02/17/24 10:14 AM  Result Value Ref Range   WBC 5.8 3.4 - 10.8 x10E3/uL   RBC 4.89 3.77 - 5.28 x10E6/uL   Hemoglobin 14.6 11.1 - 15.9 g/dL   Hematocrit 55.7 65.9 - 46.6 %   MCV 90 79 - 97 fL   MCH 29.9 26.6 - 33.0 pg   MCHC 33.0 31.5 - 35.7 g/dL   RDW 86.9 88.2 - 84.5 %   Platelets 310 150 - 450 x10E3/uL   Neutrophils 47 Not Estab. %   Lymphs 33 Not Estab. %   Monocytes 7 Not Estab. %   Eos 12 Not Estab. %   Basos 1 Not Estab. %   Neutrophils Absolute 2.8 1.4 - 7.0 x10E3/uL   Lymphocytes  Absolute 1.9 0.7 - 3.1 x10E3/uL   Monocytes Absolute 0.4 0.1 - 0.9 x10E3/uL   EOS (ABSOLUTE) 0.7 (H) 0.0 - 0.4 x10E3/uL   Basophils Absolute 0.1 0.0 - 0.2 x10E3/uL   Immature Granulocytes 0 Not Estab. %   Immature Grans (Abs) 0.0 0.0 - 0.1 x10E3/uL  Comprehensive metabolic panel with GFR     Status: Abnormal   Collection Time: 02/17/24 10:14 AM  Result Value Ref Range   Glucose 104 (H) 70 -  99 mg/dL   BUN 11 8 - 27 mg/dL   Creatinine, Ser 8.94 (H) 0.57 - 1.00 mg/dL   eGFR 60 >40 fO/fpw/8.26   BUN/Creatinine Ratio 10 (L) 12 - 28   Sodium 141 134 - 144 mmol/L   Potassium 3.9 3.5 - 5.2 mmol/L   Chloride 104 96 - 106 mmol/L   CO2 25 20 - 29 mmol/L   Calcium  9.5 8.7 - 10.3 mg/dL   Total Protein 6.8 6.0 - 8.5 g/dL   Albumin 4.4 3.9 - 4.9 g/dL   Globulin, Total 2.4 1.5 - 4.5 g/dL   Bilirubin Total 0.5 0.0 - 1.2 mg/dL   Alkaline Phosphatase 146 (H) 44 - 121 IU/L   AST 28 0 - 40 IU/L   ALT 26 0 - 32 IU/L  Hemoglobin A1c     Status: None   Collection Time: 02/17/24 10:14 AM  Result Value Ref Range   Hgb A1c MFr Bld 5.5 4.8 - 5.6 %    Comment:          Prediabetes: 5.7 - 6.4          Diabetes: >6.4          Glycemic control for adults with diabetes: <7.0    Est. average glucose Bld gHb Est-mCnc 111 mg/dL  Lipid panel     Status: None   Collection Time: 02/17/24 10:14 AM  Result Value Ref Range   Cholesterol, Total 171 100 - 199 mg/dL   Triglycerides 877 0 - 149 mg/dL   HDL 61 >60 mg/dL   VLDL Cholesterol Cal 21 5 - 40 mg/dL   LDL Chol Calc (NIH) 89 0 - 99 mg/dL   Chol/HDL Ratio 2.8 0.0 - 4.4 ratio    Comment:                                   T. Chol/HDL Ratio                                             Men  Women                               1/2 Avg.Risk  3.4    3.3                                   Avg.Risk  5.0    4.4                                2X Avg.Risk  9.6    7.1                                3X Avg.Risk 23.4   11.0   Hepatitis B Surface AntiBODY     Status: None    Collection Time: 02/17/24 10:14 AM  Result Value Ref Range   Hep B Surface Ab, Qual Non Reactive     Comment:              Non Reactive: Not immune to HBV infection.  Equivocal: Unable to determine if anti-HBs                         is present at levels consistent                         with immunity.               Reactive: Anti-HBs concentration detected                         at greater than 10 mIU/mL.                         Individual is considered to be                         immune to infection with HBV.   Nitric oxide      Status: None   Collection Time: 03/16/24  9:49 AM  Result Value Ref Range   Nitric Oxide  37   Allergen Panel (27) + IGE     Status: Abnormal   Collection Time: 03/16/24 11:00 AM  Result Value Ref Range   Class Description Allergens Comment     Comment: (NOTE)    Levels of Specific IgE       Class  Description of Class    ---------------------------  -----  --------------------                   < 0.10         0         Negative           0.10 -    0.31         0/I       Equivocal/Low           0.32 -    0.55         I         Low           0.56 -    1.40         II        Moderate           1.41 -    3.90         III       High           3.91 -   19.00         IV        Very High          19.01 -  100.00         V         Very High                  >100.00         VI        Very High    IgE (Immunoglobulin E), Serum 521 (H) 6 - 495 IU/mL   D Pteronyssinus IgE 1.83 (A) Class III kU/L   D Farinae IgE 2.42 (A) Class III kU/L   Cat Dander IgE 0.40 (A) Class I kU/L   Dog Dander IgE 0.12 (A) Class 0/I kU/L   [place] Grass IgE <0.10 Class 0 kU/L   Timothy Grass IgE 2.31 (A) Class III kU/L  [place]  Bluegrass IgE 5.14 (A) Class IV kU/L   [place] Grass IgE <0.10 Class 0 kU/L   [place] Grass IgE <0.10 Class 0 kU/L   Cockroach, American IgE <0.10 Class 0 kU/L    Comment: (NOTE) This test was developed and its performance  characteristics determined by [facility].  It has not been cleared or approved by the U.S. Food and Drug Administration. The FDA has determined that such clearance or approval is not necessary. This test is used for clinical purposes.  It should not be regarded as investigational or for research.    Penicillium Chrysogen IgE <0.10 Class 0 kU/L   Cladosporium Herbarum IgE <0.10 Class 0 kU/L   Aspergillus Fumigatus IgE <0.10 Class 0 kU/L   Mucor Racemosus IgE <0.10 Class 0 kU/L   Alternaria Alternata IgE <0.10 Class 0 kU/L   Setomelanomma Rostrat <0.10 Class 0 kU/L   Oak, White IgE 3.28 (A) Class III kU/L   Elm, American IgE <0.10 Class 0 kU/L   Maple/Box Elder IgE <0.10 Class 0 kU/L   Common Silver Valrie IgE <0.10 Class 0 kU/L   Hickory, White IgE <0.10 Class 0 kU/L    Comment: (NOTE) This test was developed and its performance characteristics determined by [facility].  It has not been cleared or approved by the U.S. Food and Drug Administration. The FDA has determined that such clearance or approval is not necessary. This test is used for clinical purposes.  It should not be regarded as investigational or for research.    White Mulberry IgE <0.10 Class 0 kU/L   Cedar, [place] IgE <0.10 Class 0 kU/L   Ragweed, Short IgE 0.50 (A) Class I kU/L   Plantain, English IgE <0.10 Class 0 kU/L   Cocklebur IgE <0.10 Class 0 kU/L   Pigweed, Rough IgE <0.10 Class 0 kU/L    Comment: (NOTE) Performed At: [facility] [place] [address] [place], [place] [id number] Jennette Shorter MD Ey:[id number]   Alpha-1-Antitrypsin Phenotyp     Status: None   Collection Time: 03/16/24 11:00 AM  Result Value Ref Range   A-1 Antitrypsin Pheno MM     Comment: (NOTE) MM Phenotype is considered to be normal, producing normal serum levels of alpha-1-protease inhibitor and not associated with clinical disease. Associated A1A total serum levels in other phenotypes and their incidence in the general  population are shown in the table below. Phenotype  Population    % function      A-1-AT Conc.*           Incidence %  compared to MM  (Typical Range)   MM        86.5%           100%         (96 - 189)   MS         8.0%            86%         (83 - 161)   MZ         3.9%            61%         (60 - 111)   FM         0.4%           100%         (93 - 191)   SZ         0.3%  41%         (42 -  75)   SS         0.1%            64%         (62 - 119)   ZZ         0.05%           19%         (16 -  38)   FS         0.05%           70%         (70 - 128)   FZ        Unknown          46%         (44 -  88)   FF        Unknown                        Unknown *A-1-AT concentration in the homozygous MM phenotype is taken as th e reference normal. Percent deficiency in each phenotype is reported relative to this reference. Ranges used to confirm phenotype. Performed At: [facility] [address] [place], [place] [id number] Jennette Shorter MD Ey:[id number]    A-1 Antitrypsin, Ser 127 101 - 187 mg/dL    Diabetic Foot Exam:     PHQ2/9:    03/29/2024   10:59 AM 02/03/2024    2:03 PM 08/26/2023    8:56 AM 05/25/2023    3:03 PM 02/18/2023    2:54 PM  Depression screen PHQ 2/9  Decreased Interest 2 3 1 2 2   Down, Depressed, Hopeless 2 3 1 2 3   PHQ - 2 Score 4 6 2 4 5   Altered sleeping 2 2 0 3 3  Tired, decreased energy 0 2 3 3 3   Change in appetite 0 0 0 3 0  Feeling bad or failure about yourself  0 0 0 0 0  Trouble concentrating 2 0 0 1 0  Moving slowly or fidgety/restless 0 0 0 1 0  Suicidal thoughts 0 0 0 0 0  PHQ-9 Score 8 10 5 15 11   Difficult doing work/chores Very difficult Somewhat difficult Not difficult at all Somewhat difficult     phq 9 is positive  Fall Risk:    03/29/2024   10:56 AM 02/03/2024    2:03 PM 08/26/2023    8:56 AM 05/25/2023    3:03 PM 02/18/2023    2:54 PM  Fall Risk   Falls in the past year? 0 0 0 0 0  Number falls in past yr: 0 0 0   0  Injury with Fall? 0 0 0  0  Risk for fall due to : No Fall Risks No Fall Risks No Fall Risks No Fall Risks No Fall Risks  Follow up Falls evaluation completed Falls evaluation completed Falls prevention discussed;Education provided;Falls evaluation completed Falls prevention discussed Falls prevention discussed      Assessment & Plan Seronegative rheumatoid arthritis with chronic pain syndrome  Significant joint pain, especially in hands, managed with Amjevita. Some improvement noted, but pain and functional limitations persist. - Continue Amjevita 40 mg subcutaneously every two weeks. - Ensure compliance with medication regimen for work accommodation. - Use heating pads and take breaks as needed. - Document medical accommodation for work to allow working from home.  Chronic low  back pain Severe pain affects balance and commuting ability, contributing to chronic pain syndrome. - Document chronic low back pain as a contributing factor for work accommodation.  Moderate persistent asthma Contributes to exertional shortness of breath, under pulmonologist care with eosinophilia. - Document asthma as a contributing factor for work accommodation due to difficulty walking from parking lot to her desk .  Raynaud's disease Causes significant pain in cold weather, affecting commuting ability. - Document Raynaud's disease as a contributing factor for work accommodation .  Dysphagia with recurrent vomiting Symptoms suggest possible esophageal stenosis, not yet evaluated by gastroenterology. - Refer to gastroenterology for evaluation. - Prescribe reflux medication to manage symptoms until evaluation. - Send prescription to [facility].  Lateral right foot strain Painful but weight-bearing, likely strain without fracture. Symptoms improving since recent fall

## 2024-04-06 ENCOUNTER — Ambulatory Visit: Admitting: Pulmonary Disease

## 2024-04-06 DIAGNOSIS — R0602 Shortness of breath: Secondary | ICD-10-CM | POA: Diagnosis not present

## 2024-04-06 LAB — PULMONARY FUNCTION TEST
DL/VA % pred: 121 %
DL/VA: 5.14 ml/min/mmHg/L
DLCO unc % pred: 88 %
DLCO unc: 17.05 ml/min/mmHg
FEF 25-75 Post: 1.36 L/s
FEF 25-75 Pre: 0.96 L/s
FEF2575-%Change-Post: 42 %
FEF2575-%Pred-Post: 61 %
FEF2575-%Pred-Pre: 43 %
FEV1-%Change-Post: 12 %
FEV1-%Pred-Post: 71 %
FEV1-%Pred-Pre: 63 %
FEV1-Post: 1.73 L
FEV1-Pre: 1.53 L
FEV1FVC-%Change-Post: 6 %
FEV1FVC-%Pred-Pre: 92 %
FEV6-%Change-Post: 5 %
FEV6-%Pred-Post: 74 %
FEV6-%Pred-Pre: 70 %
FEV6-Post: 2.25 L
FEV6-Pre: 2.12 L
FEV6FVC-%Change-Post: 0 %
FEV6FVC-%Pred-Post: 103 %
FEV6FVC-%Pred-Pre: 103 %
FVC-%Change-Post: 6 %
FVC-%Pred-Post: 72 %
FVC-%Pred-Pre: 67 %
FVC-Post: 2.26 L
FVC-Pre: 2.13 L
Post FEV1/FVC ratio: 76 %
Post FEV6/FVC ratio: 99 %
Pre FEV1/FVC ratio: 72 %
Pre FEV6/FVC Ratio: 100 %
RV % pred: 104 %
RV: 2.06 L
TLC % pred: 87 %
TLC: 4.31 L

## 2024-04-06 NOTE — Patient Instructions (Signed)
 Full PFT completed today ? ?

## 2024-04-06 NOTE — Progress Notes (Signed)
 Full PFT completed today ? ?

## 2024-05-06 ENCOUNTER — Encounter: Admitting: Family Medicine

## 2024-05-26 ENCOUNTER — Ambulatory Visit: Admitting: Pulmonary Disease

## 2024-08-05 ENCOUNTER — Encounter: Payer: Self-pay | Admitting: Family Medicine

## 2024-08-05 ENCOUNTER — Ambulatory Visit: Admitting: Family Medicine

## 2024-08-05 VITALS — BP 118/78 | HR 97 | Resp 16 | Ht 63.0 in | Wt 149.2 lb

## 2024-08-05 DIAGNOSIS — N951 Menopausal and female climacteric states: Secondary | ICD-10-CM

## 2024-08-05 DIAGNOSIS — Z09 Encounter for follow-up examination after completed treatment for conditions other than malignant neoplasm: Secondary | ICD-10-CM | POA: Diagnosis not present

## 2024-08-05 DIAGNOSIS — Z9109 Other allergy status, other than to drugs and biological substances: Secondary | ICD-10-CM | POA: Insufficient documentation

## 2024-08-05 DIAGNOSIS — G8929 Other chronic pain: Secondary | ICD-10-CM

## 2024-08-05 DIAGNOSIS — M0600 Rheumatoid arthritis without rheumatoid factor, unspecified site: Secondary | ICD-10-CM | POA: Diagnosis not present

## 2024-08-05 DIAGNOSIS — G4700 Insomnia, unspecified: Secondary | ICD-10-CM

## 2024-08-05 DIAGNOSIS — Z23 Encounter for immunization: Secondary | ICD-10-CM

## 2024-08-05 DIAGNOSIS — K219 Gastro-esophageal reflux disease without esophagitis: Secondary | ICD-10-CM | POA: Insufficient documentation

## 2024-08-05 DIAGNOSIS — G894 Chronic pain syndrome: Secondary | ICD-10-CM

## 2024-08-05 DIAGNOSIS — J4540 Moderate persistent asthma, uncomplicated: Secondary | ICD-10-CM | POA: Insufficient documentation

## 2024-08-05 DIAGNOSIS — E538 Deficiency of other specified B group vitamins: Secondary | ICD-10-CM

## 2024-08-05 DIAGNOSIS — F331 Major depressive disorder, recurrent, moderate: Secondary | ICD-10-CM | POA: Diagnosis not present

## 2024-08-05 DIAGNOSIS — E7849 Other hyperlipidemia: Secondary | ICD-10-CM | POA: Insufficient documentation

## 2024-08-05 MED ORDER — BUPROPION HCL ER (XL) 150 MG PO TB24: 150.0000 mg | ORAL_TABLET | Freq: Every day | ORAL | 1 refills | Status: AC

## 2024-08-05 MED ORDER — DULOXETINE HCL 60 MG PO CPEP: ORAL_CAPSULE | ORAL | 1 refills | Status: AC

## 2024-08-05 MED ORDER — ROSUVASTATIN CALCIUM 40 MG PO TABS: 40.0000 mg | ORAL_TABLET | Freq: Every day | ORAL | 1 refills | Status: AC

## 2024-08-05 MED ORDER — QUETIAPINE FUMARATE 25 MG PO TABS: 25.0000 mg | ORAL_TABLET | Freq: Every day | ORAL | 1 refills | Status: AC

## 2024-08-05 MED ORDER — CYCLOBENZAPRINE HCL 10 MG PO TABS: 10.0000 mg | ORAL_TABLET | Freq: Every day | ORAL | 1 refills | Status: AC

## 2024-08-05 MED ORDER — TRAMADOL HCL 50 MG PO TABS: 50.0000 mg | ORAL_TABLET | Freq: Two times a day (BID) | ORAL | 0 refills | Status: AC

## 2024-08-05 MED ORDER — MONTELUKAST SODIUM 10 MG PO TABS: 10.0000 mg | ORAL_TABLET | Freq: Every day | ORAL | 3 refills | Status: AC

## 2024-08-05 MED ORDER — CLONIDINE HCL 0.1 MG PO TABS: 0.1000 mg | ORAL_TABLET | Freq: Two times a day (BID) | ORAL | 1 refills | Status: AC

## 2024-08-05 MED ORDER — LEVOCETIRIZINE DIHYDROCHLORIDE 5 MG PO TABS: 5.0000 mg | ORAL_TABLET | Freq: Every evening | ORAL | 1 refills | Status: AC

## 2024-08-05 MED ORDER — OMEPRAZOLE 40 MG PO CPDR: 40.0000 mg | DELAYED_RELEASE_CAPSULE | Freq: Every day | ORAL | 1 refills | Status: AC

## 2024-08-05 NOTE — Progress Notes (Signed)
 Name: Candice Guzman   MRN: [id number]    DOB: [birth year]   Date:08/05/2024       Progress Note  Subjective  Chief Complaint  Chief Complaint  Patient presents with   Medical Management of Chronic Issues   Discussed the use of AI scribe software for clinical note transcription with the patient, who gave verbal consent to proceed.  History of Present Illness Candice Guzman is a 64 year old female with moderate persistent asthma and rheumatoid arthritis who presents with worsening pain and shortness of breath.  She experiences worsening shortness of breath, particularly during physical activities, which induces a sensation of panic. She uses Symbicort  and albuterol  as needed for her breathing. She had to cancer follow up visit with pulmonologist and has not started any other medications for breathing   She experiences significant pain, particularly in her hands and back, described as throbbing. Stress and work exacerbate her pain, and she is considering retirement due to symptom severity. She previously received Amjevita injections for her rheumatoid arthritis but had to stop due to insurance changes. Currently, she is without rheumatoid arthritis medication, experiencing increased pain and difficulty with daily activities.  She reports major depression and chronic pain, and is unable to engage in enjoyable activities. She takes loxapine and Wellbutrin  for depression. She reports poor sleep due to pain, taking Seroquel  and cyclobenzaprine  to aid sleep, though these are not fully effective.  She has familial hyperlipidemia and is on rosuvastatin  for cholesterol management. She also takes omeprazole  for reflux, which she needs daily. Her current medications include tramadol  for pain, which she finds she needs to take two at a time for relief, clonidine  for hot flashes, and duloxetine , which also helps with pain management.  No recent episodes of Raynaud's phenomenon. She reports balance  issues attributed to back pain and has previously tried physical therapy without benefit. She has received steroid injections in her back in the past, which did not provide relief.    Patient Active Problem List   Diagnosis Date Noted   Familial hyperlipidemia 08/05/2024   Immunosuppressed status 08/26/2023   Osteopenia after menopause 10/08/2022   Raynaud's disease without gangrene 08/20/2022   Seronegative rheumatoid arthritis (HCC) 02/17/2022   Vitamin B12 deficiency 02/17/2022   Anxiety 02/17/2022   Bilateral bunions 02/17/2022   Pain in joint of right shoulder 01/18/2019   Stiffness of right shoulder joint 01/18/2019   Osteoarthritis involving multiple joints 12/07/2018   Moderate episode of recurrent major depressive disorder (HCC) 08/27/2018   Foot drop (Right) 08/04/2018   Abnormal MRI, lumbar spine (06/21/2018) 08/04/2018   Chronic lumbar radicular pain (L5 dermatome) (Bilateral) (R>L) 05/26/2018   Chronic lower extremity pain (1ry area of Pain) (Bilateral) (R>L) 09/21/2017   Chronic low back pain without sciatica 09/21/2017   Spondylosis without myelopathy or radiculopathy, lumbosacral region 09/21/2017   Chronic hip pain (3ry area of Pain) (Bilateral) (R>L) 09/21/2017   Chronic shoulder pain (Bilateral) (L>R) 09/21/2017   Grade 1 Anterolisthesis of L5 over S1 & L3 over L4 09/21/2017   Grade 1 Retrolisthesis L1 over L2 & L2 over L3 09/21/2017   DDD (degenerative disc disease), lumbar 09/21/2017   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) 09/21/2017   Lumbar facet joint syndrome (Bilateral) (R>L) 09/21/2017   Chronic musculoskeletal pain 09/21/2017   Neurogenic pain 09/21/2017   Lumbar foraminal stenosis (L5-S1) (Bilateral) 09/21/2017   Lumbar lateral recess stenosis (Bilateral) (B: L4-5) (L>R: L3-4) 09/21/2017   Lumbar facet hypertrophy (Bilateral:  L3-4, L4-5, and L5-S1) 09/21/2017   Chronic pain syndrome 07/08/2017   Chronic knee pain (4th area of Pain)  (Bilateral) 07/08/2017   Chronic wrist pain (Fifth Area of Pain) (Bilateral) 07/08/2017   Other long term (current) drug therapy 07/08/2017   Chronic pain of multiple joints 05/20/2017   Insomnia, persistent 12/18/2014   Depression, major, single episode, moderate (HCC) 12/18/2014   Menopausal symptom 12/18/2014   Vitamin D  deficiency 12/18/2014   Lumbosacral radiculopathy (L5) (Right) 12/12/2013   Bulge of lumbar disc without myelopathy 06/18/2011    Past Surgical History:  Procedure Laterality Date   BREAST BIOPSY Left 2010   neg   CESAREAN SECTION     FINGER ARTHROPLASTY Left 07/24/2017   Thumb   GANGLION CYST EXCISION     METACARPOPHALANGEAL JOINT ARTHROPLASTY Right 06/06/2016   Dr. Jyl    SHOULDER ARTHROSCOPY WITH ROTATOR CUFF REPAIR Right 02/24/2019   Procedure: RIGHT SHOULDER ARTHROSCOPY WITH SUBACROMIAL DECOMPRESSION, DISTAL CLAVICLE EXCISION, LYSIS OF ADHESIONS,CAPSULOTOMY,MINI OPEN ROTATOR CUFF REPAIR;  Surgeon: Marchia Drivers, MD;  Location: [facility];  Service: Orthopedics;  Laterality: Right;    Family History  Problem Relation Age of Onset   Emphysema Mother    Cancer Father 77       lung   Cancer Brother        tongue   Dementia Brother    Breast cancer Neg Hx     Social History   Tobacco Use   Smoking status: Never   Smokeless tobacco: Never  Substance Use Topics   Alcohol use: No    Alcohol/week: 0.0 standard drinks of alcohol    Current Medications[1]  Allergies[2]  I personally reviewed active problem list, medication list, allergies with the patient/caregiver today.   ROS  Ten systems reviewed and is negative except as mentioned in HPI    Objective Physical Exam CONSTITUTIONAL: Patient appears well-developed and well-nourished. No distress. HEENT: Head atraumatic, normocephalic, neck supple. CARDIOVASCULAR: Normal rate, regular rhythm and normal heart sounds. No murmur heard. No BLE edema. PULMONARY: Effort normal and breath  sounds normal. No respiratory distress. ABDOMINAL: There is no tenderness or distention. MUSCULOSKELETAL: Normal gait. Without gross motor or sensory deficit. Deformity on the finger. PSYCHIATRIC: Patient has a normal mood and affect. Behavior is normal. Judgment and thought content normal.     Vitals:   08/05/24 1410  BP: 118/78  Pulse: 97  Resp: 16  SpO2: 100%  Weight: 149 lb 3.2 oz (67.7 kg)  Height: 5' 3 (1.6 m)    Body mass index is 26.43 kg/m.    PHQ2/9:    08/05/2024    2:09 PM 03/29/2024   10:59 AM 02/03/2024    2:03 PM 08/26/2023    8:56 AM 05/25/2023    3:03 PM  Depression screen PHQ 2/9  Decreased Interest 3 2 3 1 2   Down, Depressed, Hopeless 3 2 3 1 2   PHQ - 2 Score 6 4 6 2 4   Altered sleeping 3 2 2  0 3  Tired, decreased energy 3 0 2 3 3   Change in appetite 0 0 0 0 3  Feeling bad or failure about yourself  0 0 0 0 0  Trouble concentrating 3 2 0 0 1  Moving slowly or fidgety/restless 0 0 0 0 1  Suicidal thoughts 0 0 0 0 0  PHQ-9 Score 15 8  10  5  15    Difficult doing work/chores Very difficult Very difficult Somewhat difficult Not difficult at all Somewhat  difficult     Data saved with a previous flowsheet row definition    phq 9 is positive  Fall Risk:    08/05/2024    2:08 PM 03/29/2024   10:56 AM 02/03/2024    2:03 PM 08/26/2023    8:56 AM 05/25/2023    3:03 PM  Fall Risk   Falls in the past year? 0 0 0 0 0  Number falls in past yr: 0 0 0 0   Injury with Fall? 0 0  0  0    Risk for fall due to : No Fall Risks No Fall Risks No Fall Risks No Fall Risks No Fall Risks  Follow up Falls evaluation completed Falls evaluation completed Falls evaluation completed Falls prevention discussed;Education provided;Falls evaluation completed Falls prevention discussed     Data saved with a previous flowsheet row definition     Assessment & Plan Seronegative rheumatoid arthritis Chronic joint pain exacerbated by stress and work. Previous Amjevita effective  but discontinued due to insurance. Current pain management inadequate. - Prescribed tramadol  up to three times daily as needed. - Advised discussion of disability options with employer. - Encouraged rheumatologist follow-up every three months.  Chronic pain syndrome with chronic low back pain Chronic low back pain affecting balance and daily activities. Previous steroid injections ineffective. Pain management hindered by insurance issues. - Prescribed tramadol  up to three times daily as needed. - pain contract signed today  - Advised discussion of disability options with employer. - Encouraged rheumatologist follow-up every three months.  Major depressive disorder, recurrent, moderate Moderate depression exacerbated by chronic pain and stress. Current treatment includes Wellbutrin  and duloxetine . - Continue Wellbutrin  and duloxetine . - Advised discussion of disability options with employer.  Moderate persistent asthma with multiple environmental allergies Asthma exacerbated by environmental allergens. Current treatment includes Symbicort  and albuterol . - Prescribed Singulair . - Advised to avoid known allergens.  Familial hyperlipidemia High cholesterol despite rosuvastatin . - Continue rosuvastatin .  Gastroesophageal reflux disease Reflux managed with omeprazole . - Continue omeprazole .  Insomnia Insomnia likely related to chronic pain and stress. Current treatment includes Seroquel  and cyclobenzaprine . - Continue Seroquel  and cyclobenzaprine . - Advised to space out medications.  Menopausal symptoms Symptoms well-controlled with clonidine . - Continue clonidine .  General health maintenance Immunizations due. Immunosuppressed due to rheumatoid arthritis treatment. - Administered pneumonia and flu vaccines. - Plan hepatitis B vaccine at next visit.        [1]  Current Outpatient Medications:    Adalimumab-atto (AMJEVITA) 40 MG/0.4ML SOSY, Inject 40 mg into the skin every  14 (fourteen) days., Disp: , Rfl:    albuterol  (VENTOLIN  HFA) 108 (90 Base) MCG/ACT inhaler, Inhale 2 puffs into the lungs every 6 (six) hours as needed for wheezing or shortness of breath., Disp: 8 g, Rfl: 2   budesonide -formoterol  (SYMBICORT ) 160-4.5 MCG/ACT inhaler, Inhale 2 puffs into the lungs 2 (two) times daily. Rinse mouth well after use., Disp: 10.2 g, Rfl: 11   buPROPion  (WELLBUTRIN  XL) 150 MG 24 hr tablet, Take 1 tablet (150 mg total) by mouth daily with breakfast., Disp: 90 tablet, Rfl: 1   cloNIDine  (CATAPRES ) 0.1 MG tablet, Take 1 tablet (0.1 mg total) by mouth 2 (two) times daily., Disp: 180 tablet, Rfl: 1   cyclobenzaprine  (FLEXERIL ) 10 MG tablet, Take 1 tablet (10 mg total) by mouth at bedtime., Disp: 90 tablet, Rfl: 1   DULoxetine  (CYMBALTA ) 60 MG capsule, TAKE 1 CAPSULE(60 MG) BY MOUTH DAILY, Disp: 90 capsule, Rfl: 1   omeprazole  (PRILOSEC) 40 MG  capsule, Take 1 capsule (40 mg total) by mouth daily., Disp: 90 capsule, Rfl: 1   QUEtiapine  (SEROQUEL ) 25 MG tablet, Take 1 tablet (25 mg total) by mouth at bedtime., Disp: 90 tablet, Rfl: 1   rosuvastatin  (CRESTOR ) 40 MG tablet, Take 1 tablet (40 mg total) by mouth daily., Disp: 90 tablet, Rfl: 1   traMADol  (ULTRAM ) 50 MG tablet, Take 1 tablet (50 mg total) by mouth 2 (two) times daily as needed. For chronic pain, Disp: 120 tablet, Rfl: 0 [2]  Allergies Allergen Reactions   Codeine Itching   Methotrexate  Nausea Only
